# Patient Record
Sex: Male | Born: 1970 | Race: White | Hispanic: No | Marital: Single | Smoking: Current every day smoker
Health system: Southern US, Community
[De-identification: ages and names within clinical notes are randomized; demographics above are authoritative.]

## PROBLEM LIST (undated history)

## (undated) DIAGNOSIS — Z21 Asymptomatic human immunodeficiency virus [HIV] infection status: Secondary | ICD-10-CM

## (undated) DIAGNOSIS — E66811 Obesity, class 1: Secondary | ICD-10-CM

## (undated) DIAGNOSIS — N189 Chronic kidney disease, unspecified: Secondary | ICD-10-CM

## (undated) DIAGNOSIS — M109 Gout, unspecified: Secondary | ICD-10-CM

## (undated) DIAGNOSIS — G629 Polyneuropathy, unspecified: Secondary | ICD-10-CM

## (undated) DIAGNOSIS — E11621 Type 2 diabetes mellitus with foot ulcer: Secondary | ICD-10-CM

## (undated) DIAGNOSIS — L732 Hidradenitis suppurativa: Secondary | ICD-10-CM

## (undated) DIAGNOSIS — G4733 Obstructive sleep apnea (adult) (pediatric): Secondary | ICD-10-CM

## (undated) DIAGNOSIS — I5032 Chronic diastolic (congestive) heart failure: Secondary | ICD-10-CM

## (undated) DIAGNOSIS — I509 Heart failure, unspecified: Secondary | ICD-10-CM

## (undated) DIAGNOSIS — I2089 Other forms of angina pectoris: Secondary | ICD-10-CM

## (undated) DIAGNOSIS — J449 Chronic obstructive pulmonary disease, unspecified: Secondary | ICD-10-CM

## (undated) DIAGNOSIS — N184 Chronic kidney disease, stage 4 (severe): Secondary | ICD-10-CM

## (undated) DIAGNOSIS — B20 Human immunodeficiency virus [HIV] disease: Secondary | ICD-10-CM

## (undated) DIAGNOSIS — F17200 Nicotine dependence, unspecified, uncomplicated: Secondary | ICD-10-CM

## (undated) DIAGNOSIS — D649 Anemia, unspecified: Secondary | ICD-10-CM

## (undated) DIAGNOSIS — Z8619 Personal history of other infectious and parasitic diseases: Secondary | ICD-10-CM

## (undated) DIAGNOSIS — G4700 Insomnia, unspecified: Secondary | ICD-10-CM

## (undated) DIAGNOSIS — I10 Essential (primary) hypertension: Secondary | ICD-10-CM

## (undated) DIAGNOSIS — E113399 Type 2 diabetes mellitus with moderate nonproliferative diabetic retinopathy without macular edema, unspecified eye: Secondary | ICD-10-CM

## (undated) DIAGNOSIS — I25118 Atherosclerotic heart disease of native coronary artery with other forms of angina pectoris: Secondary | ICD-10-CM

## (undated) DIAGNOSIS — E119 Type 2 diabetes mellitus without complications: Secondary | ICD-10-CM

## (undated) DIAGNOSIS — E785 Hyperlipidemia, unspecified: Secondary | ICD-10-CM

## (undated) DIAGNOSIS — M1990 Unspecified osteoarthritis, unspecified site: Secondary | ICD-10-CM

## (undated) DIAGNOSIS — Z720 Tobacco use: Secondary | ICD-10-CM

## (undated) HISTORY — DX: Insomnia, unspecified: G47.00

## (undated) HISTORY — DX: Type 2 diabetes mellitus without complications: E11.9

## (undated) HISTORY — DX: Asymptomatic human immunodeficiency virus (hiv) infection status: Z21

## (undated) HISTORY — DX: Personal history of other infectious and parasitic diseases: Z86.19

## (undated) HISTORY — DX: Essential (primary) hypertension: I10

## (undated) HISTORY — DX: Human immunodeficiency virus (HIV) disease: B20

## (undated) HISTORY — DX: Hyperlipidemia, unspecified: E78.5

## (undated) HISTORY — PX: STENT PLACEMENT VASCULAR (ARMC HX): HXRAD1737

## (undated) HISTORY — DX: Tobacco use: Z72.0

---

## 2002-06-22 ENCOUNTER — Encounter: Payer: Self-pay | Admitting: Otolaryngology

## 2002-06-22 ENCOUNTER — Ambulatory Visit (HOSPITAL_COMMUNITY): Admission: RE | Admit: 2002-06-22 | Discharge: 2002-06-22 | Payer: Self-pay | Admitting: Otolaryngology

## 2007-04-26 ENCOUNTER — Ambulatory Visit: Payer: Self-pay | Admitting: Oncology

## 2007-04-27 ENCOUNTER — Ambulatory Visit: Payer: Self-pay | Admitting: Emergency Medicine

## 2007-04-29 ENCOUNTER — Ambulatory Visit: Payer: Self-pay | Admitting: Emergency Medicine

## 2007-05-01 ENCOUNTER — Encounter: Payer: Self-pay | Admitting: Infectious Diseases

## 2007-05-01 LAB — CBC WITH DIFFERENTIAL (CANCER CENTER ONLY)
BASO%: 0.2 % (ref 0.0–2.0)
EOS%: 1.8 % (ref 0.0–7.0)
HGB: 13.2 g/dL (ref 13.0–17.1)
LYMPH#: 0.1 10*3/uL — ABNORMAL LOW (ref 0.9–3.3)
MCHC: 34.5 g/dL (ref 32.0–35.9)
NEUT#: 1.3 10*3/uL — ABNORMAL LOW (ref 1.5–6.5)
RDW: 12.2 % (ref 10.5–14.6)
WBC: 1.6 10*3/uL — ABNORMAL LOW (ref 4.0–10.0)

## 2007-05-01 LAB — CONVERTED CEMR LAB
HCV Ab: NEGATIVE
Hep B S Ab: NEGATIVE
Hepatitis B Surface Ag: NEGATIVE

## 2007-05-01 LAB — MORPHOLOGY - CHCC SATELLITE: Platelet Morphology: NORMAL

## 2007-05-01 LAB — PROTIME-INR (CHCC SATELLITE)
INR: 1.1 — ABNORMAL LOW (ref 2.0–3.5)
Protime: 13.2 Seconds (ref 10.6–13.4)

## 2007-05-02 LAB — COMPREHENSIVE METABOLIC PANEL
AST: 32 U/L (ref 0–37)
Albumin: 3.5 g/dL (ref 3.5–5.2)
Alkaline Phosphatase: 97 U/L (ref 39–117)
BUN: 9 mg/dL (ref 6–23)
Potassium: 3.9 mEq/L (ref 3.5–5.3)

## 2007-05-02 LAB — IRON AND TIBC
%SAT: 55 % (ref 20–55)
TIBC: 308 ug/dL (ref 215–435)

## 2007-05-02 LAB — RETICULOCYTES (CHCC)
ABS Retic: 8.4 10*3/uL — ABNORMAL LOW (ref 19.0–186.0)
RBC.: 4.21 MIL/uL — ABNORMAL LOW (ref 4.22–5.81)

## 2007-05-02 LAB — FERRITIN: Ferritin: 1071 ng/mL — ABNORMAL HIGH (ref 22–322)

## 2007-05-03 ENCOUNTER — Encounter: Payer: Self-pay | Admitting: Oncology

## 2007-05-03 ENCOUNTER — Other Ambulatory Visit: Admission: RE | Admit: 2007-05-03 | Discharge: 2007-05-03 | Payer: Self-pay | Admitting: Oncology

## 2007-05-03 LAB — CBC WITH DIFFERENTIAL (CANCER CENTER ONLY)
BASO#: 0 10*3/uL (ref 0.0–0.2)
Eosinophils Absolute: 0 10*3/uL (ref 0.0–0.5)
HGB: 13.5 g/dL (ref 13.0–17.1)
LYMPH#: 0.1 10*3/uL — ABNORMAL LOW (ref 0.9–3.3)
MONO#: 0.3 10*3/uL (ref 0.1–0.9)
NEUT#: 1 10*3/uL — ABNORMAL LOW (ref 1.5–6.5)
RBC: 4.2 10*6/uL (ref 4.20–5.70)

## 2007-05-04 LAB — CYTOMEGALOVIRUS ANTIBODY, IGG: Cytomegalovirus Ab-IgG: <0.8 Index (ref <0.80)

## 2007-05-08 LAB — HEPATITIS A ANTIBODY, TOTAL: Hep A Total Ab: NEGATIVE

## 2007-05-08 LAB — HIV 1/2 CONFIRMATION: HIV-2 Ab: NEGATIVE

## 2007-05-08 LAB — HIV ANTIBODY (ROUTINE TESTING W REFLEX): HIV: REACTIVE

## 2007-05-08 LAB — HEPATITIS C ANTIBODY: HCV Ab: NEGATIVE

## 2007-05-13 ENCOUNTER — Ambulatory Visit: Payer: Self-pay | Admitting: Oncology

## 2007-05-15 LAB — BASIC METABOLIC PANEL - CANCER CENTER ONLY
CO2: 29 mEq/L (ref 18–33)
Calcium: 8.9 mg/dL (ref 8.0–10.3)
Creat: 1.1 mg/dl (ref 0.6–1.2)
Glucose, Bld: 144 mg/dL — ABNORMAL HIGH (ref 73–118)

## 2007-05-16 LAB — CBC WITH DIFFERENTIAL (CANCER CENTER ONLY)
BASO#: 0 10*3/uL (ref 0.0–0.2)
Eosinophils Absolute: 0 10*3/uL (ref 0.0–0.5)
HCT: 39.5 % (ref 38.7–49.9)
HGB: 13.7 g/dL (ref 13.0–17.1)
LYMPH%: 11.1 % — ABNORMAL LOW (ref 14.0–48.0)
MCH: 31.7 pg (ref 28.0–33.4)
MCV: 92 fL (ref 82–98)
MONO%: 14.7 % — ABNORMAL HIGH (ref 0.0–13.0)
NEUT#: 1.1 10*3/uL — ABNORMAL LOW (ref 1.5–6.5)
RBC: 4.32 10*6/uL (ref 4.20–5.70)

## 2007-05-17 ENCOUNTER — Ambulatory Visit: Payer: Self-pay | Admitting: Infectious Diseases

## 2007-05-17 ENCOUNTER — Encounter: Admission: RE | Admit: 2007-05-17 | Discharge: 2007-05-17 | Payer: Self-pay | Admitting: Infectious Diseases

## 2007-05-17 DIAGNOSIS — B370 Candidal stomatitis: Secondary | ICD-10-CM | POA: Insufficient documentation

## 2007-05-17 DIAGNOSIS — E119 Type 2 diabetes mellitus without complications: Secondary | ICD-10-CM | POA: Insufficient documentation

## 2007-05-17 DIAGNOSIS — E118 Type 2 diabetes mellitus with unspecified complications: Secondary | ICD-10-CM

## 2007-05-17 DIAGNOSIS — E1165 Type 2 diabetes mellitus with hyperglycemia: Secondary | ICD-10-CM

## 2007-05-31 ENCOUNTER — Ambulatory Visit: Payer: Self-pay | Admitting: Infectious Diseases

## 2007-05-31 DIAGNOSIS — B20 Human immunodeficiency virus [HIV] disease: Secondary | ICD-10-CM | POA: Insufficient documentation

## 2007-05-31 DIAGNOSIS — L0292 Furuncle, unspecified: Secondary | ICD-10-CM | POA: Insufficient documentation

## 2007-05-31 DIAGNOSIS — G4700 Insomnia, unspecified: Secondary | ICD-10-CM | POA: Insufficient documentation

## 2007-05-31 DIAGNOSIS — L0293 Carbuncle, unspecified: Secondary | ICD-10-CM

## 2007-06-03 ENCOUNTER — Encounter (INDEPENDENT_AMBULATORY_CARE_PROVIDER_SITE_OTHER): Payer: Self-pay | Admitting: *Deleted

## 2007-06-20 ENCOUNTER — Telehealth: Payer: Self-pay | Admitting: Infectious Diseases

## 2007-06-21 ENCOUNTER — Ambulatory Visit: Payer: Self-pay | Admitting: Internal Medicine

## 2007-06-21 LAB — CONVERTED CEMR LAB
BUN: 12 mg/dL (ref 6–23)
Bilirubin Urine: NEGATIVE
Blood in Urine, dipstick: NEGATIVE
CO2: 25 meq/L (ref 19–32)
Calcium: 9.7 mg/dL (ref 8.4–10.5)
Chloride: 101 meq/L (ref 96–112)
Creatinine, Ser: 0.86 mg/dL (ref 0.40–1.50)
Glucose, Bld: 172 mg/dL — ABNORMAL HIGH (ref 70–99)
Glucose, Urine, Semiquant: NEGATIVE
Ketones, urine, test strip: NEGATIVE
Nitrite: NEGATIVE
Potassium: 4.4 meq/L (ref 3.5–5.3)
Protein, U semiquant: NEGATIVE
Sodium: 139 meq/L (ref 135–145)
Specific Gravity, Urine: 1.015
Urobilinogen, UA: 0.2
WBC Urine, dipstick: NEGATIVE
pH: 6

## 2007-07-05 DIAGNOSIS — M549 Dorsalgia, unspecified: Secondary | ICD-10-CM | POA: Insufficient documentation

## 2007-07-08 ENCOUNTER — Ambulatory Visit: Payer: Self-pay | Admitting: Infectious Diseases

## 2007-07-08 ENCOUNTER — Encounter: Admission: RE | Admit: 2007-07-08 | Discharge: 2007-07-08 | Payer: Self-pay | Admitting: Infectious Diseases

## 2007-07-08 DIAGNOSIS — I10 Essential (primary) hypertension: Secondary | ICD-10-CM

## 2007-07-08 LAB — CONVERTED CEMR LAB
ALT: 41 units/L (ref 0–53)
AST: 28 units/L (ref 0–37)
Albumin: 4.2 g/dL (ref 3.5–5.2)
Alkaline Phosphatase: 124 units/L — ABNORMAL HIGH (ref 39–117)
BUN: 9 mg/dL (ref 6–23)
CO2: 21 meq/L (ref 19–32)
Calcium: 9.3 mg/dL (ref 8.4–10.5)
Chloride: 104 meq/L (ref 96–112)
Creatinine, Ser: 0.76 mg/dL (ref 0.40–1.50)
Glucose, Bld: 191 mg/dL — ABNORMAL HIGH (ref 70–99)
HCT: 41.5 % (ref 39.0–52.0)
HIV 1 RNA Quant: 14500 copies/mL — ABNORMAL HIGH (ref <50)
HIV-1 RNA Quant, Log: 4.16 — ABNORMAL HIGH (ref <1.70)
Hemoglobin: 14 g/dL (ref 13.0–17.0)
MCHC: 33.7 g/dL (ref 30.0–36.0)
MCV: 99.8 fL (ref 78.0–100.0)
Platelets: 229 10*3/uL (ref 150–400)
Potassium: 5 meq/L (ref 3.5–5.3)
RBC: 4.16 M/uL — ABNORMAL LOW (ref 4.22–5.81)
RDW: 15.6 % — ABNORMAL HIGH (ref 11.5–14.0)
Sodium: 138 meq/L (ref 135–145)
Total Bilirubin: 0.3 mg/dL (ref 0.3–1.2)
Total Protein: 8 g/dL (ref 6.0–8.3)
WBC: 3.3 10*3/uL — ABNORMAL LOW (ref 4.0–10.5)

## 2007-07-09 ENCOUNTER — Encounter: Payer: Self-pay | Admitting: Infectious Diseases

## 2007-07-09 LAB — CONVERTED CEMR LAB: CD4 Count: 60 microliters

## 2007-07-10 ENCOUNTER — Encounter (INDEPENDENT_AMBULATORY_CARE_PROVIDER_SITE_OTHER): Payer: Self-pay | Admitting: *Deleted

## 2007-07-10 ENCOUNTER — Ambulatory Visit: Payer: Self-pay | Admitting: Oncology

## 2007-07-19 ENCOUNTER — Telehealth: Payer: Self-pay | Admitting: Infectious Diseases

## 2007-07-23 ENCOUNTER — Ambulatory Visit: Payer: Self-pay | Admitting: Hospitalist

## 2007-07-23 LAB — CONVERTED CEMR LAB: Blood Glucose, Home Monitor: 1 mg/dL

## 2007-10-07 ENCOUNTER — Telehealth: Payer: Self-pay | Admitting: Infectious Diseases

## 2007-10-08 ENCOUNTER — Telehealth: Payer: Self-pay | Admitting: Infectious Diseases

## 2007-11-06 ENCOUNTER — Ambulatory Visit: Payer: Self-pay | Admitting: Infectious Diseases

## 2007-11-06 ENCOUNTER — Encounter: Admission: RE | Admit: 2007-11-06 | Discharge: 2007-11-06 | Payer: Self-pay | Admitting: Infectious Diseases

## 2007-11-06 LAB — CONVERTED CEMR LAB
Albumin: 4.5 g/dL (ref 3.5–5.2)
CO2: 23 meq/L (ref 19–32)
Glucose, Bld: 180 mg/dL — ABNORMAL HIGH (ref 70–99)
HIV 1 RNA Quant: <50 copies/mL (ref <50)
HIV-1 RNA Quant, Log: <1.7 (ref <1.70)
MCV: 104.3 fL — ABNORMAL HIGH (ref 78.0–100.0)
Potassium: 4.8 meq/L (ref 3.5–5.3)
RBC: 4.38 M/uL (ref 4.22–5.81)
Sodium: 138 meq/L (ref 135–145)
Total Protein: 7.8 g/dL (ref 6.0–8.3)
WBC: 6.3 10*3/uL (ref 4.0–10.5)

## 2007-12-23 ENCOUNTER — Encounter (INDEPENDENT_AMBULATORY_CARE_PROVIDER_SITE_OTHER): Payer: Self-pay | Admitting: *Deleted

## 2007-12-24 ENCOUNTER — Encounter (INDEPENDENT_AMBULATORY_CARE_PROVIDER_SITE_OTHER): Payer: Self-pay | Admitting: *Deleted

## 2008-01-31 ENCOUNTER — Telehealth (INDEPENDENT_AMBULATORY_CARE_PROVIDER_SITE_OTHER): Payer: Self-pay | Admitting: *Deleted

## 2008-03-03 ENCOUNTER — Telehealth: Payer: Self-pay | Admitting: Infectious Diseases

## 2008-03-13 ENCOUNTER — Telehealth: Payer: Self-pay | Admitting: Infectious Diseases

## 2008-04-01 ENCOUNTER — Encounter: Payer: Self-pay | Admitting: Licensed Clinical Social Worker

## 2008-04-28 ENCOUNTER — Ambulatory Visit: Payer: Self-pay | Admitting: Infectious Diseases

## 2008-04-28 ENCOUNTER — Encounter: Admission: RE | Admit: 2008-04-28 | Discharge: 2008-04-28 | Payer: Self-pay | Admitting: Infectious Diseases

## 2008-04-28 LAB — CONVERTED CEMR LAB
ALT: 27 units/L (ref 0–53)
Basophils Absolute: 0 10*3/uL (ref 0.0–0.1)
CO2: 22 meq/L (ref 19–32)
Chloride: 104 meq/L (ref 96–112)
Lymphocytes Relative: 13 % (ref 12–46)
Neutro Abs: 3.5 10*3/uL (ref 1.7–7.7)
Platelets: 204 10*3/uL (ref 150–400)
RDW: 12.4 % (ref 11.5–15.5)
Sodium: 141 meq/L (ref 135–145)
Total Bilirubin: 0.2 mg/dL — ABNORMAL LOW (ref 0.3–1.2)
Total Protein: 7.1 g/dL (ref 6.0–8.3)

## 2008-05-06 ENCOUNTER — Ambulatory Visit: Payer: Self-pay | Admitting: Infectious Diseases

## 2008-05-06 DIAGNOSIS — R51 Headache: Secondary | ICD-10-CM

## 2008-05-06 DIAGNOSIS — R519 Headache, unspecified: Secondary | ICD-10-CM | POA: Insufficient documentation

## 2008-05-13 ENCOUNTER — Telehealth: Payer: Self-pay | Admitting: Infectious Diseases

## 2008-05-15 ENCOUNTER — Telehealth (INDEPENDENT_AMBULATORY_CARE_PROVIDER_SITE_OTHER): Payer: Self-pay | Admitting: *Deleted

## 2008-06-08 ENCOUNTER — Telehealth: Payer: Self-pay | Admitting: Infectious Diseases

## 2008-06-11 ENCOUNTER — Telehealth: Payer: Self-pay | Admitting: Infectious Diseases

## 2008-06-15 ENCOUNTER — Encounter (INDEPENDENT_AMBULATORY_CARE_PROVIDER_SITE_OTHER): Payer: Self-pay | Admitting: *Deleted

## 2008-08-13 ENCOUNTER — Encounter: Payer: Self-pay | Admitting: Infectious Diseases

## 2008-08-20 ENCOUNTER — Ambulatory Visit: Payer: Self-pay | Admitting: Infectious Diseases

## 2008-08-21 LAB — CONVERTED CEMR LAB
AST: 15 units/L (ref 0–37)
Alkaline Phosphatase: 97 units/L (ref 39–117)
BUN: 15 mg/dL (ref 6–23)
Basophils Relative: 0 % (ref 0–1)
Creatinine, Ser: 0.95 mg/dL (ref 0.40–1.50)
Eosinophils Absolute: 0.1 10*3/uL (ref 0.0–0.7)
Eosinophils Relative: 1 % (ref 0–5)
MCHC: 34.2 g/dL (ref 30.0–36.0)
MCV: 100.9 fL — ABNORMAL HIGH (ref 78.0–100.0)
Monocytes Absolute: 0.8 10*3/uL (ref 0.1–1.0)
Monocytes Relative: 11 % (ref 3–12)
Neutrophils Relative %: 74 % (ref 43–77)
Potassium: 4.4 meq/L (ref 3.5–5.3)
RBC: 4.49 M/uL (ref 4.22–5.81)
Total Bilirubin: 0.2 mg/dL — ABNORMAL LOW (ref 0.3–1.2)

## 2008-08-26 ENCOUNTER — Telehealth (INDEPENDENT_AMBULATORY_CARE_PROVIDER_SITE_OTHER): Payer: Self-pay | Admitting: *Deleted

## 2008-09-02 ENCOUNTER — Ambulatory Visit: Payer: Self-pay | Admitting: Internal Medicine

## 2008-09-02 LAB — CONVERTED CEMR LAB: Blood Glucose, Fingerstick: 215

## 2008-09-21 ENCOUNTER — Encounter (INDEPENDENT_AMBULATORY_CARE_PROVIDER_SITE_OTHER): Payer: Self-pay | Admitting: *Deleted

## 2008-11-16 ENCOUNTER — Encounter: Payer: Self-pay | Admitting: Infectious Diseases

## 2008-11-16 ENCOUNTER — Telehealth (INDEPENDENT_AMBULATORY_CARE_PROVIDER_SITE_OTHER): Payer: Self-pay | Admitting: *Deleted

## 2008-12-02 ENCOUNTER — Ambulatory Visit: Payer: Self-pay | Admitting: Infectious Diseases

## 2008-12-28 ENCOUNTER — Ambulatory Visit: Payer: Self-pay | Admitting: Infectious Diseases

## 2008-12-28 LAB — CONVERTED CEMR LAB
CO2: 25 meq/L (ref 19–32)
Calcium: 9.7 mg/dL (ref 8.4–10.5)
Chloride: 101 meq/L (ref 96–112)
Cholesterol: 188 mg/dL (ref 0–200)
Creatinine, Ser: 0.94 mg/dL (ref 0.40–1.50)
Eosinophils Relative: 2 % (ref 0–5)
Glucose, Bld: 232 mg/dL — ABNORMAL HIGH (ref 70–99)
HCT: 42.4 % (ref 39.0–52.0)
HIV 1 RNA Quant: 107 copies/mL — ABNORMAL HIGH (ref <48)
HIV-1 RNA Quant, Log: 2.03 — ABNORMAL HIGH (ref <1.68)
Hemoglobin: 14.1 g/dL (ref 13.0–17.0)
Lymphocytes Relative: 15 % (ref 12–46)
Lymphs Abs: 0.9 10*3/uL (ref 0.7–4.0)
Monocytes Absolute: 0.6 10*3/uL (ref 0.1–1.0)
Neutro Abs: 4.4 10*3/uL (ref 1.7–7.7)
RBC: 4.17 M/uL — ABNORMAL LOW (ref 4.22–5.81)
Total Bilirubin: 0.2 mg/dL — ABNORMAL LOW (ref 0.3–1.2)
Total CHOL/HDL Ratio: 5.9
Total Protein: 7.5 g/dL (ref 6.0–8.3)
Triglycerides: 837 mg/dL — ABNORMAL HIGH (ref <150)
WBC: 6 10*3/uL (ref 4.0–10.5)

## 2008-12-29 ENCOUNTER — Encounter (INDEPENDENT_AMBULATORY_CARE_PROVIDER_SITE_OTHER): Payer: Self-pay | Admitting: *Deleted

## 2009-01-04 ENCOUNTER — Telehealth (INDEPENDENT_AMBULATORY_CARE_PROVIDER_SITE_OTHER): Payer: Self-pay | Admitting: *Deleted

## 2009-01-05 ENCOUNTER — Ambulatory Visit: Payer: Self-pay | Admitting: Internal Medicine

## 2009-01-05 DIAGNOSIS — E785 Hyperlipidemia, unspecified: Secondary | ICD-10-CM | POA: Insufficient documentation

## 2009-01-05 LAB — CONVERTED CEMR LAB: Blood Glucose, Fingerstick: 273

## 2009-02-01 ENCOUNTER — Telehealth: Payer: Self-pay | Admitting: Internal Medicine

## 2009-03-17 ENCOUNTER — Encounter (INDEPENDENT_AMBULATORY_CARE_PROVIDER_SITE_OTHER): Payer: Self-pay | Admitting: *Deleted

## 2009-04-02 ENCOUNTER — Telehealth: Payer: Self-pay | Admitting: *Deleted

## 2009-04-02 ENCOUNTER — Telehealth (INDEPENDENT_AMBULATORY_CARE_PROVIDER_SITE_OTHER): Payer: Self-pay | Admitting: *Deleted

## 2009-04-14 ENCOUNTER — Telehealth: Payer: Self-pay | Admitting: *Deleted

## 2009-04-21 ENCOUNTER — Encounter (INDEPENDENT_AMBULATORY_CARE_PROVIDER_SITE_OTHER): Payer: Self-pay | Admitting: *Deleted

## 2009-04-21 DIAGNOSIS — E113399 Type 2 diabetes mellitus with moderate nonproliferative diabetic retinopathy without macular edema, unspecified eye: Secondary | ICD-10-CM | POA: Insufficient documentation

## 2009-04-22 ENCOUNTER — Telehealth: Payer: Self-pay | Admitting: Infectious Diseases

## 2009-04-29 ENCOUNTER — Ambulatory Visit (HOSPITAL_COMMUNITY): Admission: RE | Admit: 2009-04-29 | Discharge: 2009-04-29 | Payer: Self-pay | Admitting: Internal Medicine

## 2009-04-29 ENCOUNTER — Encounter (INDEPENDENT_AMBULATORY_CARE_PROVIDER_SITE_OTHER): Payer: Self-pay | Admitting: *Deleted

## 2009-04-29 ENCOUNTER — Ambulatory Visit: Payer: Self-pay | Admitting: Internal Medicine

## 2009-04-29 DIAGNOSIS — R079 Chest pain, unspecified: Secondary | ICD-10-CM | POA: Insufficient documentation

## 2009-04-29 LAB — CONVERTED CEMR LAB: Hgb A1c MFr Bld: 8.1 %

## 2009-05-03 ENCOUNTER — Telehealth (INDEPENDENT_AMBULATORY_CARE_PROVIDER_SITE_OTHER): Payer: Self-pay | Admitting: *Deleted

## 2009-05-04 ENCOUNTER — Telehealth (INDEPENDENT_AMBULATORY_CARE_PROVIDER_SITE_OTHER): Payer: Self-pay | Admitting: *Deleted

## 2009-05-07 IMAGING — CT CT NECK-CHEST-ABD-PELV W/ CM
1 of 2 series · 9 of 14 positions shown, 11 images · non-contrast
Comparison: none

REASON FOR EXAM: thrombocytopenia
COMMENTS:

[Series 2: soft tissue · axial · 0.77mm/px · z∈[-846,-156]mm · 9 of 174 slices shown, 11 images]
[im 18/174  soft-tissue]
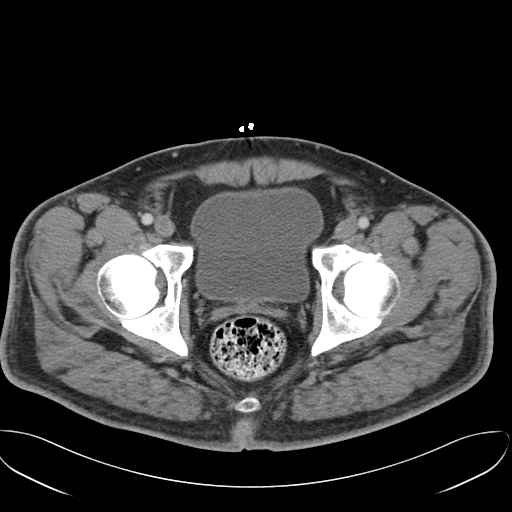
[im 18/174  bone]
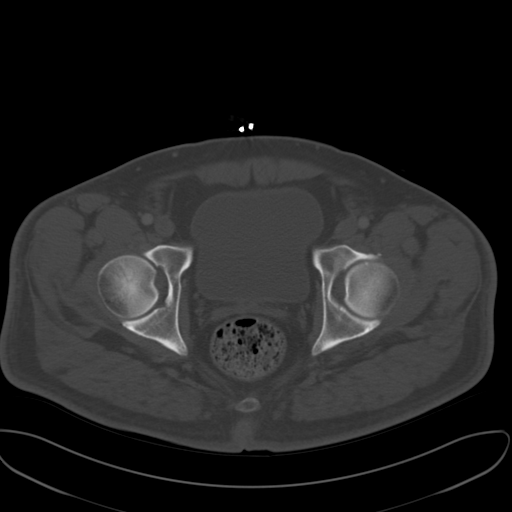
[im 35/174  soft-tissue]
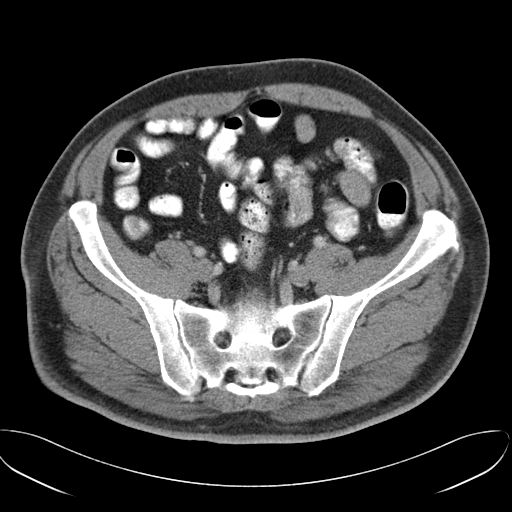
[im 52/174  soft-tissue]
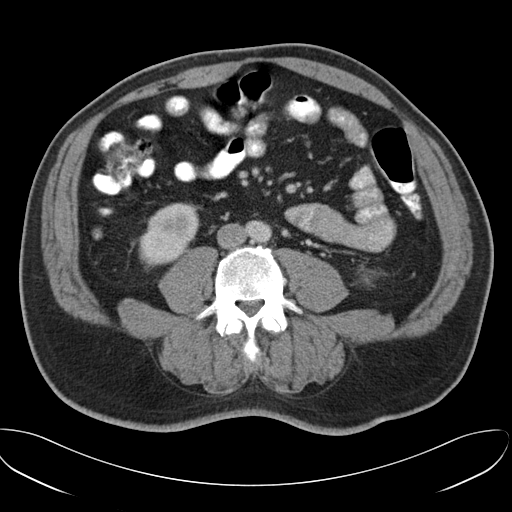
[im 70/174  soft-tissue]
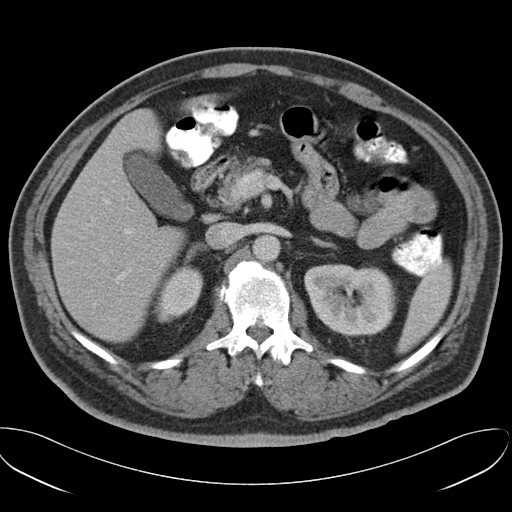
[im 87/174  soft-tissue]
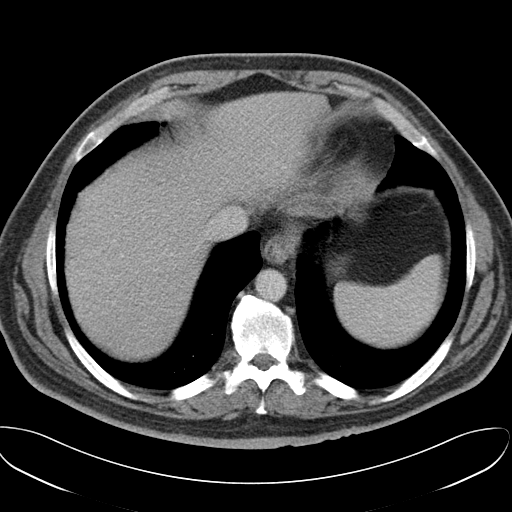
[im 104/174  soft-tissue]
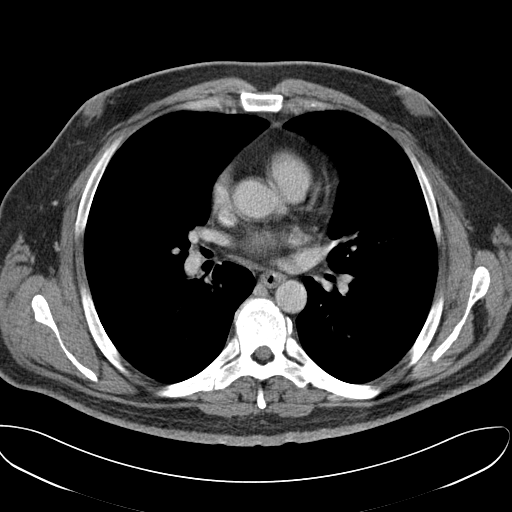
[im 122/174  soft-tissue]
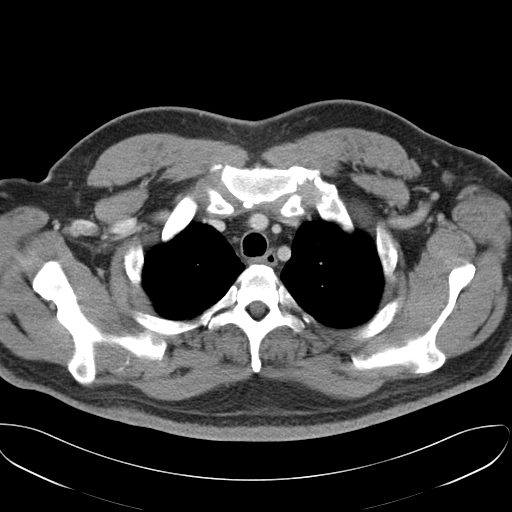
[im 139/174  soft-tissue]
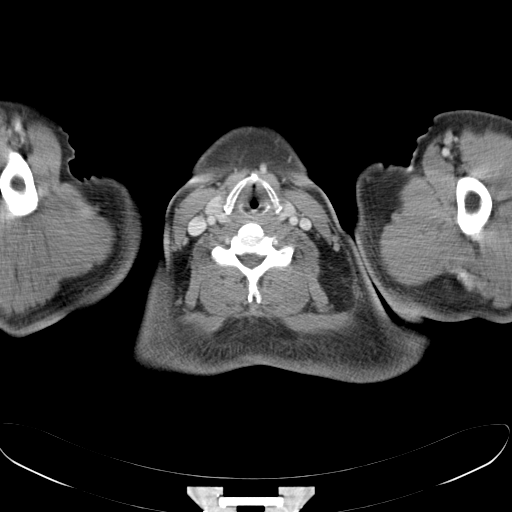
[im 156/174  soft-tissue]
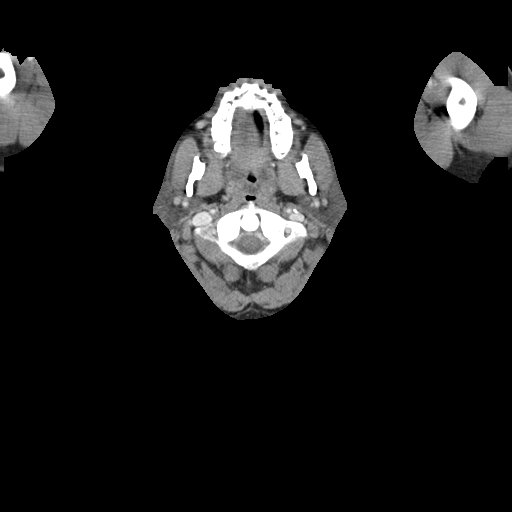
[im 156/174  bone]
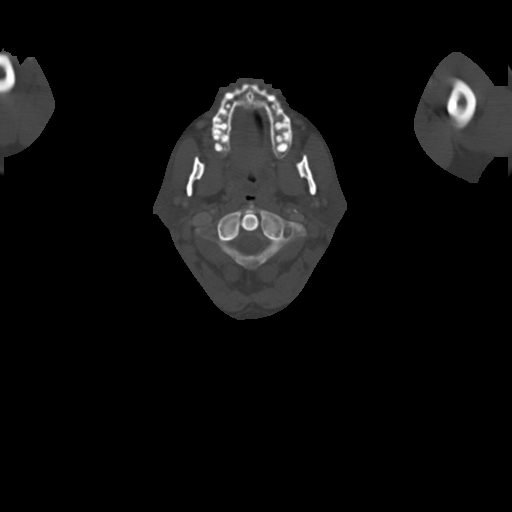

[9 of 14 positions shown; findings below may reference images not displayed]

PROCEDURE:     CT  - CT NECK CHEST ABD/PEL W  - May 13, 2007  [DATE]

RESULT:     The patient has unexplained thrombocytopenia.

CT SCAN OF THE NECK: The patient received 85 ml of Wsovue-HAS. The parotid
and submandibular glands are symmetric in density and size. The thyroid
lobes demonstrate some heterogeneous density especially on the RIGHT, which
may reflect a multinodular gland substance. The thyroid lobes are relatively
symmetric from RIGHT to LEFT. I see no pathologic sized cervical
lymphadenopathy. There is no evidence of a soft tissue abscess or soft
tissue mass within the structures of the neck. There is an air-fluid level
in the LEFT maxillary sinus with evidence of a solid-appearing soft tissue
mass-like structure that could reflect inspissated secretions or retention
cyst or polyp or conceivably neoplasm.
CONCLUSION: 1. I do not see evidence of cervical lymphadenopathy nor abscess.
2. There is soft tissue density as well as fluid in the LEFT maxillary
sinus. Correlation with the patient's clinical exam will be needed.
3.The RIGHT thyroid lobe is nodular in appearance but is symmetric in size
with the LEFT.

CT SCAN OF THE CHEST: The patient received 85 ml of Wsovue-HAS for this
study as well.

Within the mediastinum and hilar regions I see no pathologic size lymph
nodes. The cardiac chambers are normal in size. There is no pleural or
pericardial effusion. The caliber of the thoracic aorta is normal. I see no
axillary lymphadenopathy and the retrosternal soft tissues are normal in
appearance. At lung window settings I see no interstitial or alveolar
infiltrates nor abnormal nodules.
CONCLUSION: 1)I see no findings to suggest active lymphoma or other malignancy within
the thorax.

CT SCAN OF THE ABDOMEN AND PELVIS: The patient received the aforementioned
IV contrast as well as oral contrast.

The spleen is normal in size. There is likely an approximately 1.5 cm
diameter accessory spleen on image #94. The liver, gallbladder, pancreas,
stomach, adrenal glands and kidneys are normal in appearance. The partially
contrast filled loops of small and large bowel are normal in appearance.  A
moderate amount of stool and contrast is seen in the rectosigmoid. There is
no evidence of pelvic lymphadenopathy. The prostate gland and seminal
vesicles are normal in appearance. The periaortic and pericaval regions
exhibit no lymphadenopathy. There is no evidence of ascites.
IMPRESSION: 1. I do not see findings suspicious for active lymphoma within the neck or
chest or abdomen or pelvis.
2. There is evidence of probable inflammatory change with an air-fluid level
in the LEFT maxillary sinus.
3. There is nodularity in the RIGHT thyroid lobe.
4. I see no acute abnormality within the abdomen. There is a normal
appearing spleen. A tiny accessory spleen is present.
5. If a lymphoproliferative disorder is suspected, a PET CT study may be a
useful next step.

## 2009-05-10 ENCOUNTER — Telehealth (INDEPENDENT_AMBULATORY_CARE_PROVIDER_SITE_OTHER): Payer: Self-pay | Admitting: *Deleted

## 2009-05-14 ENCOUNTER — Telehealth (INDEPENDENT_AMBULATORY_CARE_PROVIDER_SITE_OTHER): Payer: Self-pay | Admitting: *Deleted

## 2009-05-27 ENCOUNTER — Telehealth (INDEPENDENT_AMBULATORY_CARE_PROVIDER_SITE_OTHER): Payer: Self-pay | Admitting: *Deleted

## 2009-06-02 ENCOUNTER — Ambulatory Visit: Payer: Self-pay | Admitting: Infectious Diseases

## 2009-06-02 DIAGNOSIS — F17200 Nicotine dependence, unspecified, uncomplicated: Secondary | ICD-10-CM | POA: Insufficient documentation

## 2009-06-02 LAB — CONVERTED CEMR LAB
ALT: 39 units/L (ref 0–53)
Albumin: 4.3 g/dL (ref 3.5–5.2)
Basophils Absolute: 0 10*3/uL (ref 0.0–0.1)
Basophils Relative: 0 % (ref 0–1)
CO2: 19 meq/L (ref 19–32)
Eosinophils Relative: 1 % (ref 0–5)
GFR calc Af Amer: >60 mL/min (ref >60)
Glucose, Bld: 188 mg/dL — ABNORMAL HIGH (ref 70–99)
HCT: 41.9 % (ref 39.0–52.0)
Hemoglobin: 13.8 g/dL (ref 13.0–17.0)
MCHC: 32.9 g/dL (ref 30.0–36.0)
Monocytes Absolute: 0.5 10*3/uL (ref 0.1–1.0)
Neutro Abs: 3.5 10*3/uL (ref 1.7–7.7)
Platelets: 184 10*3/uL (ref 150–400)
Potassium: 4.7 meq/L (ref 3.5–5.3)
RDW: 12.9 % (ref 11.5–15.5)
Sodium: 138 meq/L (ref 135–145)
Total Bilirubin: 0.2 mg/dL — ABNORMAL LOW (ref 0.3–1.2)
Total Protein: 7.4 g/dL (ref 6.0–8.3)

## 2009-07-21 ENCOUNTER — Telehealth (INDEPENDENT_AMBULATORY_CARE_PROVIDER_SITE_OTHER): Payer: Self-pay | Admitting: *Deleted

## 2009-07-29 ENCOUNTER — Telehealth (INDEPENDENT_AMBULATORY_CARE_PROVIDER_SITE_OTHER): Payer: Self-pay | Admitting: *Deleted

## 2009-08-04 ENCOUNTER — Telehealth (INDEPENDENT_AMBULATORY_CARE_PROVIDER_SITE_OTHER): Payer: Self-pay | Admitting: *Deleted

## 2009-08-10 ENCOUNTER — Ambulatory Visit: Payer: Self-pay | Admitting: Internal Medicine

## 2009-08-16 ENCOUNTER — Telehealth (INDEPENDENT_AMBULATORY_CARE_PROVIDER_SITE_OTHER): Payer: Self-pay | Admitting: Internal Medicine

## 2009-09-16 ENCOUNTER — Telehealth: Payer: Self-pay | Admitting: Internal Medicine

## 2009-09-21 ENCOUNTER — Ambulatory Visit: Payer: Self-pay | Admitting: Internal Medicine

## 2009-09-21 DIAGNOSIS — R000 Tachycardia, unspecified: Secondary | ICD-10-CM

## 2009-09-21 LAB — CONVERTED CEMR LAB: Blood Glucose, Fingerstick: 290

## 2009-09-27 ENCOUNTER — Encounter (INDEPENDENT_AMBULATORY_CARE_PROVIDER_SITE_OTHER): Payer: Self-pay | Admitting: *Deleted

## 2009-09-27 ENCOUNTER — Ambulatory Visit: Payer: Self-pay | Admitting: Infectious Diseases

## 2009-09-27 LAB — CONVERTED CEMR LAB
AST: 23 units/L (ref 0–37)
Albumin: 4.3 g/dL (ref 3.5–5.2)
BUN: 15 mg/dL (ref 6–23)
CO2: 20 meq/L (ref 19–32)
Calcium: 9.6 mg/dL (ref 8.4–10.5)
Chloride: 104 meq/L (ref 96–112)
Cholesterol: 167 mg/dL (ref 0–200)
Eosinophils Relative: 1 % (ref 0–5)
Glucose, Bld: 189 mg/dL — ABNORMAL HIGH (ref 70–99)
HCT: 39.8 % (ref 39.0–52.0)
HDL: 34 mg/dL — ABNORMAL LOW (ref >39)
HIV 1 RNA Quant: <48 copies/mL (ref <48)
Hemoglobin: 13.3 g/dL (ref 13.0–17.0)
Lymphocytes Relative: 13 % (ref 12–46)
Lymphs Abs: 0.7 10*3/uL (ref 0.7–4.0)
Monocytes Absolute: 0.6 10*3/uL (ref 0.1–1.0)
Monocytes Relative: 11 % (ref 3–12)
Potassium: 4.3 meq/L (ref 3.5–5.3)
RBC: 3.92 M/uL — ABNORMAL LOW (ref 4.22–5.81)
WBC: 5.3 10*3/uL (ref 4.0–10.5)

## 2009-10-01 ENCOUNTER — Telehealth: Payer: Self-pay | Admitting: Internal Medicine

## 2009-10-12 ENCOUNTER — Encounter (INDEPENDENT_AMBULATORY_CARE_PROVIDER_SITE_OTHER): Payer: Self-pay | Admitting: Internal Medicine

## 2009-10-12 ENCOUNTER — Ambulatory Visit (HOSPITAL_COMMUNITY): Admission: RE | Admit: 2009-10-12 | Discharge: 2009-10-12 | Payer: Self-pay | Admitting: Internal Medicine

## 2009-10-15 ENCOUNTER — Telehealth: Payer: Self-pay | Admitting: *Deleted

## 2009-10-15 ENCOUNTER — Telehealth (INDEPENDENT_AMBULATORY_CARE_PROVIDER_SITE_OTHER): Payer: Self-pay | Admitting: *Deleted

## 2009-12-01 ENCOUNTER — Telehealth (INDEPENDENT_AMBULATORY_CARE_PROVIDER_SITE_OTHER): Payer: Self-pay | Admitting: *Deleted

## 2009-12-06 ENCOUNTER — Telehealth (INDEPENDENT_AMBULATORY_CARE_PROVIDER_SITE_OTHER): Payer: Self-pay | Admitting: *Deleted

## 2010-05-06 ENCOUNTER — Telehealth: Payer: Self-pay | Admitting: Internal Medicine

## 2010-05-24 ENCOUNTER — Telehealth: Payer: Self-pay | Admitting: Infectious Diseases

## 2010-06-09 ENCOUNTER — Telehealth: Payer: Self-pay | Admitting: Internal Medicine

## 2010-06-20 ENCOUNTER — Encounter (INDEPENDENT_AMBULATORY_CARE_PROVIDER_SITE_OTHER): Payer: Self-pay | Admitting: *Deleted

## 2010-06-20 ENCOUNTER — Ambulatory Visit: Payer: Self-pay | Admitting: Internal Medicine

## 2010-06-20 LAB — CONVERTED CEMR LAB
Blood Glucose, Fingerstick: 332
Microalb, Ur: 1.66 mg/dL (ref 0.00–1.89)

## 2010-09-06 ENCOUNTER — Telehealth (INDEPENDENT_AMBULATORY_CARE_PROVIDER_SITE_OTHER): Payer: Self-pay | Admitting: *Deleted

## 2010-09-09 ENCOUNTER — Ambulatory Visit: Payer: Self-pay | Admitting: Infectious Diseases

## 2010-09-09 ENCOUNTER — Encounter: Payer: Self-pay | Admitting: Internal Medicine

## 2010-09-09 LAB — CONVERTED CEMR LAB
ALT: 34 units/L (ref 0–53)
AST: 20 units/L (ref 0–37)
Albumin: 4.3 g/dL (ref 3.5–5.2)
Alkaline Phosphatase: 80 units/L (ref 39–117)
Basophils Relative: 0 % (ref 0–1)
Eosinophils Absolute: 0.1 10*3/uL (ref 0.0–0.7)
Lymphs Abs: 0.9 10*3/uL (ref 0.7–4.0)
MCHC: 34.8 g/dL (ref 30.0–36.0)
MCV: 99.5 fL (ref 78.0–100.0)
Neutrophils Relative %: 73 % (ref 43–77)
Platelets: 189 10*3/uL (ref 150–400)
Potassium: 4.5 meq/L (ref 3.5–5.3)
Sodium: 138 meq/L (ref 135–145)
Total Protein: 6.9 g/dL (ref 6.0–8.3)
WBC: 5.4 10*3/uL (ref 4.0–10.5)

## 2010-09-26 ENCOUNTER — Telehealth: Payer: Self-pay | Admitting: Internal Medicine

## 2010-10-17 ENCOUNTER — Telehealth (INDEPENDENT_AMBULATORY_CARE_PROVIDER_SITE_OTHER): Payer: Self-pay | Admitting: *Deleted

## 2010-10-19 ENCOUNTER — Ambulatory Visit: Payer: Self-pay | Admitting: Infectious Diseases

## 2010-10-28 ENCOUNTER — Telehealth: Payer: Self-pay | Admitting: Internal Medicine

## 2010-11-01 ENCOUNTER — Telehealth (INDEPENDENT_AMBULATORY_CARE_PROVIDER_SITE_OTHER): Payer: Self-pay | Admitting: *Deleted

## 2010-11-03 ENCOUNTER — Telehealth (INDEPENDENT_AMBULATORY_CARE_PROVIDER_SITE_OTHER): Payer: Self-pay | Admitting: *Deleted

## 2010-11-11 ENCOUNTER — Telehealth: Payer: Self-pay | Admitting: Internal Medicine

## 2010-11-11 ENCOUNTER — Telehealth: Payer: Self-pay | Admitting: *Deleted

## 2010-11-16 ENCOUNTER — Telehealth: Payer: Self-pay | Admitting: *Deleted

## 2010-11-17 ENCOUNTER — Encounter: Payer: Self-pay | Admitting: Internal Medicine

## 2010-11-21 ENCOUNTER — Encounter (INDEPENDENT_AMBULATORY_CARE_PROVIDER_SITE_OTHER): Payer: Self-pay | Admitting: *Deleted

## 2010-11-22 ENCOUNTER — Ambulatory Visit: Payer: Self-pay | Admitting: Internal Medicine

## 2010-11-22 LAB — CONVERTED CEMR LAB: Hgb A1c MFr Bld: 8.4 %

## 2010-12-05 ENCOUNTER — Ambulatory Visit: Payer: Self-pay | Admitting: Internal Medicine

## 2010-12-07 ENCOUNTER — Encounter: Payer: Self-pay | Admitting: Internal Medicine

## 2011-01-02 ENCOUNTER — Ambulatory Visit: Admit: 2011-01-02 | Payer: Self-pay

## 2011-01-22 LAB — CONVERTED CEMR LAB
ALT: 49 units/L (ref 0–53)
AST: 32 units/L (ref 0–37)
Albumin: 4.4 g/dL (ref 3.5–5.2)
Alkaline Phosphatase: 125 units/L — ABNORMAL HIGH (ref 39–117)
BUN: 9 mg/dL (ref 6–23)
CO2: 24 meq/L (ref 19–32)
Calcium: 9.8 mg/dL (ref 8.4–10.5)
Chlamydia, Swab/Urine, PCR: NEGATIVE
Chloride: 105 meq/L (ref 96–112)
Cholesterol: 164 mg/dL (ref 0–200)
Creatinine, Ser: 0.88 mg/dL (ref 0.40–1.50)
GC Probe Amp, Urine: NEGATIVE
Glucose, Bld: 216 mg/dL — ABNORMAL HIGH (ref 70–99)
HCT: 42.8 % (ref 39.0–52.0)
HDL: 47 mg/dL (ref >39)
HIV 1 RNA Quant: 92600 copies/mL — ABNORMAL HIGH (ref <50)
HIV-1 RNA Quant, Log: 4.97 — ABNORMAL HIGH (ref <1.70)
Hemoglobin: 14.7 g/dL (ref 13.0–17.0)
LDL Cholesterol: 90 mg/dL (ref 0–99)
MCHC: 34.3 g/dL (ref 30.0–36.0)
MCV: 92 fL (ref 78.0–100.0)
Platelets: 91 10*3/uL — ABNORMAL LOW (ref 150–400)
Potassium: 4.8 meq/L (ref 3.5–5.3)
RBC: 4.65 M/uL (ref 4.22–5.81)
RDW: 13.9 % (ref 11.5–14.0)
Sodium: 142 meq/L (ref 135–145)
Total Bilirubin: 0.4 mg/dL (ref 0.3–1.2)
Total CHOL/HDL Ratio: 3.5
Total Protein: 8.3 g/dL (ref 6.0–8.3)
Triglycerides: 137 mg/dL (ref <150)
VLDL: 27 mg/dL (ref 0–40)
WBC: 2.4 10*3/uL — ABNORMAL LOW (ref 4.0–10.5)

## 2011-01-26 NOTE — Progress Notes (Signed)
Summary: Refill/gh  Phone Note Refill Request Message from:  Patient on September 26, 2010 4:18 PM  Refills Requested: Medication #1:  METFORMIN HCL 1000 MG TABS Take one tablet by mouth twice daily  Method Requested: Electronic Initial call taken by: Angelina Ok RN,  September 26, 2010 4:18 PM  Follow-up for Phone Call        Rx denied because was refilled in June with 11 refills. Follow-up by: Deatra Robinson MD,  September 29, 2010 10:11 PM

## 2011-01-26 NOTE — Miscellaneous (Signed)
Summary: new script called into [facility]  Clinical Lists Changes  Medications: Rx of ATRIPLA 600-200-300 MG  TABS (EFAVIRENZ-EMTRICITAB-TENOFOVIR) once daily;  #30 x 11;  Signed;  Entered by: Paulo Fruit  BS,CPht II,MPH;  Authorized by: Johny Sax MD;  Method used: Electronically to [facility] [phone]*, [address] [place], [place]  [address], Ph: [phone], Fax: Observations: Added new observation of AIDSDAP: Yes 2011 (06/20/2010 9:28)    Prescriptions: ATRIPLA 600-200-300 MG  TABS (EFAVIRENZ-EMTRICITAB-TENOFOVIR) once daily  #30 x 11   Entered by:   Paulo Fruit  BS,CPht II,MPH   Authorized by:   Johny Sax MD   Signed by:   Paulo Fruit  BS,CPht II,MPH on 06/20/2010   Method used:   Electronically to        [facility] [phone]* (retail)       [address]       [place], [place]  [address]       Ph: [phone]       Fax:    RxID:   [id number]  Paulo Fruit  BS,CPht II,MPH  June 20, 2010 9:29 AM

## 2011-01-26 NOTE — Progress Notes (Signed)
Summary: refill/ hla  Phone Note Refill Request Message from:  Patient on November 11, 2010 12:05 PM  Refills Requested: Medication #1:  METFORMIN HCL 500 MG TABS Take one tablet by mouth daily in the morning   Dosage confirmed as above?Dosage Confirmed Initial call taken by: Marin Roberts RN,  November 11, 2010 12:06 PM  Follow-up for Phone Call        Rx denied because needs OV Follow-up by: Deatra Robinson MD,  November 11, 2010 3:08 PM

## 2011-01-26 NOTE — Miscellaneous (Signed)
Summary: med refill request denied  Denied med refill request. Needs an OV follow up. thank you.

## 2011-01-26 NOTE — Assessment & Plan Note (Signed)
Summary: ACUTE-EST/CK/MEDS/CFB   Vital Signs:  Patient profile:   40 year old male Height:      73 inches Weight:      244.6 pounds BMI:     32.39 Temp:     97.4 degrees F oral Pulse rate:   90 / minute BP sitting:   133 / 84  (right arm)  Vitals Entered By: Filomena Jungling NT II (December 05, 2010 8:21 AM) CC: F/U visit for diabetes. Is Patient Diabetic? Yes Did you bring your meter with you today? No Pain Assessment Patient in pain? yes     Location: right hip Intensity: 2 Type: aching Onset of pain  Intermittent Nutritional Status BMI of > 30 = obese CBG Result 217  Have you ever been in a relationship where you felt threatened, hurt or afraid?No   Does patient need assistance? Functional Status Self care Ambulation Normal   Primary Care Provider:  Deatra Robinson MD  CC:  F/U visit for diabetes..  History of Present Illness: A f/u  OV. Patient denies any concerns. Takes all his medications as prescribed. Denies any Sx.  Depression History:      The patient denies a depressed mood most of the day and a diminished interest in his usual daily activities.         Preventive Screening-Counseling & Management  Alcohol-Tobacco     Alcohol drinks/day: very little     Alcohol type: occasionally     Smoking Status: current     Smoking Cessation Counseling: yes     Packs/Day: 1.0     Year Started: about 20 years  Caffeine-Diet-Exercise     Caffeine use/day: tea occassionally     Does Patient Exercise: no     Type of exercise: active at work  Comments: counselled on smoking cessation  Current Problems (verified): 1)  Tachycardia  (ICD-785.0) 2)  Tobacco User  (ICD-305.1) 3)  Chest Pain  (ICD-786.50) 4)  Diabetes-type 2  (ICD-250.00) 5)  Hyperlipidemia  (ICD-272.4) 6)  Headache  (ICD-784.0) 7)  Hypertension  (ICD-401.9) 8)  Back Pain  (ICD-724.5) 9)  Furuncle  (ICD-680.9) 10)  Insomnia  (ICD-780.52) 11)  Aids  (ICD-042) 12)  Candidiasis, Mouth (THRUSH)   (ICD-112.0) 13)  Family History Diabetes 1st Degree Relative  (ICD-V18.0) 14)  Family History of Cad Male 1st Degree Relative <50  (ICD-V17.3) 15)  Family History of Cad Male 1st Degree Relative <60  (ICD-V16.49) 16)  Aodm  (ICD-250.00)  Medications Prior to Update: 1)  Metformin Hcl 1000 Mg Tabs (Metformin Hcl) .... Take One Tablet By Mouth Twice Daily 2)  Glipizide 10 Mg Tabs (Glipizide) .... Take 1 Tablet By Mouth Twice A Day 3)  Atripla 600-200-300 Mg  Tabs (Efavirenz-Emtricitab-Tenofovir) .... Once Daily 4)  Lisinopril-Hydrochlorothiazide 20-25 Mg Tabs (Lisinopril-Hydrochlorothiazide) .... Take One Tablet By Mouth Once Daily 5)  Pravastatin Sodium 40 Mg Tabs (Pravastatin Sodium) .... Take One Tablet By Mouth Once Daily 6)  Lantus 100 Unit/ml Soln (Insulin Glargine) .... Inject 38 Units Subcutaneously At Night 7)  Aspirin Adult Low Strength 81 Mg Tbec (Aspirin) .... Take One Tab By Mouth Once Daily 8)  Nitrostat 0.4 Mg Subl (Nitroglycerin) .... Place One Tab Under Tongue For Relief of Chest Pain Every 5 Minutes, Max 3 Doses in 15 Minutes 9)  Insulin Syringe 29g X 1/2" 0.5 Ml Misc (Insulin Syringe-Needle U-100) .... Use It To Draw Insulin As Prescribed.  Current Medications (verified): 1)  Metformin Hcl 1000 Mg Tabs (Metformin Hcl) .Marland KitchenMarland KitchenMarland Kitchen  Take One Tablet By Mouth Twice Daily 2)  Glipizide 10 Mg Tabs (Glipizide) .... Take 1 Tablet By Mouth Twice A Day 3)  Atripla 600-200-300 Mg  Tabs (Efavirenz-Emtricitab-Tenofovir) .... Once Daily 4)  Lisinopril-Hydrochlorothiazide 20-25 Mg Tabs (Lisinopril-Hydrochlorothiazide) .... Take One Tablet By Mouth Once Daily 5)  Pravastatin Sodium 40 Mg Tabs (Pravastatin Sodium) .... Take One Tablet By Mouth Once Daily 6)  Lantus 100 Unit/ml Soln (Insulin Glargine) .... Inject 38 Units Subcutaneously At Night 7)  Aspirin Adult Low Strength 81 Mg Tbec (Aspirin) .... Take One Tab By Mouth Once Daily 8)  Nitrostat 0.4 Mg Subl (Nitroglycerin) .... Place One Tab  Under Tongue For Relief of Chest Pain Every 5 Minutes, Max 3 Doses in 15 Minutes 9)  Insulin Syringe 29g X 1/2" 0.5 Ml Misc (Insulin Syringe-Needle U-100) .... Use It To Draw Insulin As Prescribed.  Allergies (verified): 1)  ! Sulfa  Past History:  Past Medical History: Last updated: 09/27/2009 HIV+ DM2 Hyperlipidemia Smoker  Family History: Last updated: 05/17/2007 Family History of CAD Male 1st degree relative <60 Family History of CAD Male 1st degree relative <50 Family History Diabetes 1st degree relative  Social History: Last updated: 05/17/2007 Current Smoker- 2ppd. nerves shot, couldn't quit now.  Alcohol use-yes. never to point of passing out.   same girlfriend x 7 yrs. no prev txf, no prev male male intercourse. unclear how many heterosexual partners he has had (>10, <100).   Risk Factors: Alcohol Use: very little (12/05/2010) Caffeine Use: tea occassionally (12/05/2010) Exercise: no (12/05/2010)  Risk Factors: Smoking Status: current (12/05/2010) Packs/Day: 1.0 (12/05/2010)  Physical Exam  General:  alert and well-developed.   Head:  normocephalic.   Eyes:  vision grossly intact, pupils equal, pupils round, and pupils reactive to light.   Mouth:  pharynx pink and moist.   Neck:  supple.   Lungs:  normal respiratory effort, no accessory muscle use, no crackles, and no wheezes.   Heart:  normal rate, regular rhythm, no murmur, no gallop, and no rub.   Abdomen:  soft, non-tender, normal bowel sounds, no distention, and no masses.   Msk:  normal ROM, no joint tenderness, no joint swelling, and no joint warmth.   Pulses:  2+ dp/pt pulses bilaterally.  Extremities:  No edema.  Neurologic:  alert & oriented X3, cranial nerves II-XII intact, and strength normal in all extremities.   Psych:  Oriented X3 and normally interactive.    Diabetes Management Exam:    Foot Exam (with socks and/or shoes not present):       Sensory-Pinprick/Light touch:          Left  medial foot (L-4): diminished          Left dorsal foot (L-5): diminished          Left lateral foot (S-1): diminished          Right medial foot (L-4): diminished          Right dorsal foot (L-5): diminished          Right lateral foot (S-1): diminished       Sensory-Monofilament:          Left foot: diminished          Right foot: diminished       Inspection:          Left foot: normal          Right foot: normal       Nails:  Left foot: thickened          Right foot: thickened    Foot Exam by Podiatrist:       Date: 12/05/2010       Results: early diabetic findings       Done by: Denton Meek   Impression & Recommendations:  Problem # 1:  DIABETES-TYPE 2 (ICD-250.00) Assessment Unchanged Will increase lantus to 40 Units Subcutaneously at bedtime. D/C glipizide. Diet, exercise, foot care and weight managment discussed with the patient. re-referred for a fundoscopic exam->will need to see Ms. Chauncey Reading for [facility] assistance program application. It is too soon to recheck HgbA1C. Consider to check fructominase instead ->will discuss withMs. Rhiley. Otherwise, will follow up after the [facility] season in February. The following medications were removed from the medication list:    Glipizide 10 Mg Tabs (Glipizide) .Marland Kitchen... Take 1 tablet by mouth twice a day His updated medication list for this problem includes:    Metformin Hcl 1000 Mg Tabs (Metformin hcl) .Marland Kitchen... Take one tablet by mouth twice daily    Lisinopril-hydrochlorothiazide 20-25 Mg Tabs (Lisinopril-hydrochlorothiazide) .Marland Kitchen... Take one tablet by mouth once daily    Lantus 100 Unit/ml Soln (Insulin glargine) ..... Inject 40 units subcutaneously at night    Aspirin Adult Low Strength 81 Mg Tbec (Aspirin) .Marland Kitchen... Take one tab by mouth once daily  Labs Reviewed: Creat: 1.05 (09/09/2010)     Last Eye Exam: Moderate non-proliferative diabetic retinopathy.  OU HIV retinopathy OU Visual acuity OD: 20/30      Visual acuity OS  :20/25        (04/21/2009) Reviewed HgBA1c results: 8.4 (11/22/2010)  8.2 (06/20/2010)  His updated medication list for this problem includes:    Metformin Hcl 1000 Mg Tabs (Metformin hcl) .Marland Kitchen... Take one tablet by mouth twice daily    Glipizide 10 Mg Tabs (Glipizide) .Marland Kitchen... Take 1 tablet by mouth twice a day    Lisinopril-hydrochlorothiazide 20-25 Mg Tabs (Lisinopril-hydrochlorothiazide) .Marland Kitchen... Take one tablet by mouth once daily    Lantus 100 Unit/ml Soln (Insulin glargine) ..... Inject 38 units subcutaneously at night    Aspirin Adult Low Strength 81 Mg Tbec (Aspirin) .Marland Kitchen... Take one tab by mouth once daily  Problem # 2:  HYPERLIPIDEMIA (ICD-272.4) Assessment: Unchanged Low cholesterol and high fiber diet discussed with the patient. His updated medication list for this problem includes:    Pravastatin Sodium 40 Mg Tabs (Pravastatin sodium) .Marland Kitchen... Take one tablet by mouth once daily  Orders: T-Lipid Profile [phone])  Labs Reviewed: SGOT: 20 (09/09/2010)   SGPT: 34 (09/09/2010)  Prior 10 Yr Risk Heart Disease: Not enough information (04/29/2009)   HDL:34 (09/27/2009), 32 (12/28/2008)  LDL:* mg/dL (51/76/1607), See Comment mg/dL (37/09/6268)  SWNI:627 (09/27/2009), 188 (12/28/2008)  Trig:418 (09/27/2009), 837 (12/28/2008)  His updated medication list for this problem includes:    Pravastatin Sodium 40 Mg Tabs (Pravastatin sodium) .Marland Kitchen... Take one tablet by mouth once daily  Problem # 3:  HYPERTENSION (ICD-401.9) Assessment: Unchanged No change in regimen. Low salt diet; weight management addressed. His updated medication list for this problem includes:    Lisinopril-hydrochlorothiazide 20-25 Mg Tabs (Lisinopril-hydrochlorothiazide) .Marland Kitchen... Take one tablet by mouth once daily  BP today: 133/84 Prior BP: 126/79 (11/22/2010)  Prior 10 Yr Risk Heart Disease: Not enough information (04/29/2009)  Labs Reviewed: K+: 4.5 (09/09/2010) Creat: : 1.05 (09/09/2010)   Chol: 167 (09/27/2009)    HDL: 34 (09/27/2009)   LDL: * mg/dL (03/50/0938)   TG: 182 (09/27/2009)  Problem # 4:  HIV INFECTION (ICD-042) Assessment: Unchanged patient is being followed by Dr. [name] in ID clinic. Continue with Atripla as instructed.  Complete Medication List: 1)  Metformin Hcl 1000 Mg Tabs (Metformin hcl) .... Take one tablet by mouth twice daily 2)  Atripla 600-200-300 Mg Tabs (Efavirenz-emtricitab-tenofovir) .... Once daily 3)  Lisinopril-hydrochlorothiazide 20-25 Mg Tabs (Lisinopril-hydrochlorothiazide) .... Take one tablet by mouth once daily 4)  Pravastatin Sodium 40 Mg Tabs (Pravastatin sodium) .... Take one tablet by mouth once daily 5)  Lantus 100 Unit/ml Soln (Insulin glargine) .... Inject 40 units subcutaneously at night 6)  Aspirin Adult Low Strength 81 Mg Tbec (Aspirin) .... Take one tab by mouth once daily 7)  Nitrostat 0.4 Mg Subl (Nitroglycerin) .... Place one tab under tongue for relief of chest pain every 5 minutes, max 3 doses in 15 minutes 8)  Insulin Syringe 29g X 1/2" 0.5 Ml Misc (Insulin syringe-needle u-100) .... Use it to draw insulin as prescribed.  Other Orders: Capillary Blood Glucose/CBG (14782) Tdap => 65yrs IM (95621) Pneumococcal Vaccine (30865) Admin 1st Vaccine (78469) Admin of Any Addtl Vaccine (62952)  Patient Instructions: 1)  Please, increase Insulin dose to 40 Units before bedtime. 2)  Please, check your blood sugar once a day before breakfast. 3)  Please, return in February (on an empty stomach)->for an office visit and a blood work. 4)  Please, call with any questions. 5)  Happy Holidays!!! Prescriptions: NITROSTAT 0.4 MG SUBL (NITROGLYCERIN) Place one tab under tongue for relief of chest pain every 5 minutes, max 3 doses in 15 minutes  #30 x 11   Entered and Authorized by:   Deatra Robinson MD   Signed by:   Deatra Robinson MD on 12/05/2010   Method used:   Electronically to        [facility] (retail)       [address]        [place], [place]  [address]       Ph: [phone]       Fax: [phone]   RxID:   [id number] LANTUS 100 UNIT/ML SOLN (INSULIN GLARGINE) Inject 40 units subcutaneously at night  #1 vial x 11   Entered and Authorized by:   Deatra Robinson MD   Signed by:   Deatra Robinson MD on 12/05/2010   Method used:   Electronically to        [facility].* (retail)       [address]       [place], [place]  [address]       Ph: [phone]       Fax: [phone]   RxID:   [id number]    Orders Added: 1)  Capillary Blood Glucose/CBG [82948] 2)  T-Lipid Profile [80061-22930] 3)  Est. Patient Level III [51761] 4)  Tdap => 72yrs IM [90715] 5)  Pneumococcal Vaccine [90732] 6)  Admin 1st Vaccine [90471] 7)  Admin of Any Addtl Vaccine [60737]   Immunizations Administered:  Tetanus Vaccine:    Vaccine Type: Tdap    Site: right deltoid    Mfr: GlaxoSmithKline    Dose: 0.5 ml    Route: IM    Given by: Chinita Pester RN    Exp. Date: 10/13/2012    Lot #: [id number]    VIS given: 11/11/08 version given December 05, 2010.  Pneumonia Vaccine:    Vaccine Type: Pneumovax  Site: right deltoid    Mfr: Merck    Dose: 0.5 ml    Route: IM    Given by: Chinita Pester RN    Exp. Date: 02/11/2012    Lot #: [id number]    VIS given: 11/29/09 version given December 05, 2010.   Immunizations Administered:  Tetanus Vaccine:    Vaccine Type: Tdap    Site: right deltoid    Mfr: GlaxoSmithKline    Dose: 0.5 ml    Route: IM    Given by: Chinita Pester RN    Exp. Date: 10/13/2012    Lot #: [id number]    VIS given: 11/11/08 version given December 05, 2010.  Pneumonia Vaccine:    Vaccine Type: Pneumovax    Site: right deltoid    Mfr: Merck    Dose: 0.5 ml    Route: IM    Given by: Chinita Pester RN    Exp. Date: 02/11/2012    Lot #: [id number]    VIS given: 11/29/09 version given December 05, 2010. Process Orders Check Orders  Results:     [facility]: ABN not required for this insurance Tests Sent for requisitioning (December 05, 2010 11:58 AM):     12/05/2010: [facility] -- T-Lipid Profile [phone] (signed)    Prevention & Chronic Care Immunizations   Influenza vaccine: Fluvax Non-MCR  (10/20/2010)   Influenza vaccine due: 08/26/2011    Tetanus booster: 12/05/2010: Tdap   Tetanus booster due: 12/05/2020    Pneumococcal vaccine: Pneumovax  (12/05/2010)   Pneumococcal vaccine due: 12/06/2015  Other Screening   Smoking status: current  (12/05/2010)   Smoking cessation counseling: yes  (12/05/2010)  Diabetes Mellitus   HgbA1C: 8.4  (11/22/2010)   Hemoglobin A1C due: 03/06/2011    Eye exam: Moderate non-proliferative diabetic retinopathy.  OU HIV retinopathy OU Visual acuity OD: 20/30      Visual acuity OS :20/25         (04/21/2009)   Diabetic eye exam action/deferral: Ophthalmology referral  (06/20/2010)   Eye exam due: 10/2009    Foot exam: yes  (12/05/2010)   Foot exam action/deferral: Do today   High risk foot: Not documented   Foot care education: Not documented   Foot exam due: 03/06/2011    Urine microalbumin/creatinine ratio: 14.2  (06/20/2010)   Urine microalbumin action/deferral: Ordered   Urine microalbumin/cr due: 12/06/2011    Diabetes flowsheet reviewed?: Yes   Progress toward A1C goal: Unchanged    Stage of readiness to change (diabetes management): Action  Lipids   Total Cholesterol: 167  (09/27/2009)   Lipid panel action/deferral: Lipid Panel ordered   LDL: * mg/dL  (84/69/6295)   LDL Direct: Not documented   HDL: 34  (09/27/2009)   Triglycerides: 418  (09/27/2009)   Lipid panel due: 12/06/2011    SGOT (AST): 20  (09/09/2010)   SGPT (ALT): 34  (09/09/2010)   Alkaline phosphatase: 80  (09/09/2010)   Total bilirubin: 0.3  (09/09/2010)   Liver panel due: 09/10/2011    Lipid flowsheet reviewed?: Yes   Progress toward LDL  goal: Unchanged    Stage of readiness to change (lipid management): Maintenance  Hypertension   Last Blood Pressure: 133 / 84  (12/05/2010)   Serum creatinine: 1.05  (09/09/2010)   Serum potassium 4.5  (09/09/2010)   Basic metabolic panel due: 09/10/2011    Hypertension flowsheet reviewed?: Yes   Progress toward BP goal: Unchanged    Stage of readiness to change (hypertension management):  Maintenance  Self-Management Support :   Personal Goals (by the next clinic visit) :     Personal A1C goal: 7  (06/20/2010)     Personal blood pressure goal: 130/80  (06/20/2010)     Personal LDL goal: 100  (06/20/2010)    Patient will work on the following items until the next clinic visit to reach self-care goals:     Medications and monitoring: take my medicines every day, bring all of my medications to every visit, examine my feet every day  (12/05/2010)     Eating: drink diet soda or water instead of juice or soda, eat more vegetables, use fresh or frozen vegetables, eat foods that are low in salt, eat baked foods instead of fried foods, eat fruit for snacks and desserts  (12/05/2010)     Activity: take a 30 minute walk every day  (12/05/2010)    Diabetes self-management support: Written self-care plan, Education handout  (12/05/2010)   Diabetes care plan printed   Diabetes education handout printed   Last diabetes self-management training by diabetes educator: 04/30/2009   Last medical nutrition therapy: 07/23/2007    Hypertension self-management support: Written self-care plan, Education handout  (12/05/2010)   Hypertension self-care plan printed.   Hypertension education handout printed    Lipid self-management support: Written self-care plan  (12/05/2010)   Lipid self-care plan printed.   Nursing Instructions: Give tetanus booster today Give Pneumovax today

## 2011-01-26 NOTE — Progress Notes (Signed)
Summary: Refill/gh  Phone Note Refill Request Message from:  Patient  Refills Requested: Medication #1:  PRAVASTATIN SODIUM 40 MG TABS Take one tablet by mouth once daily  Medication #2:  METFORMIN HCL 500 MG TABS Take one tablet by mouth daily in the morning  Method Requested: Electronic Initial call taken by: Angelina Ok RN,  May 06, 2010 1:57 PM  Follow-up for Phone Call        Rx denied because needs an office visit Follow-up by: Deatra Robinson MD,  May 08, 2010 4:26 PM  Additional Follow-up for Phone Call Additional follow up Details #1::        Rx denial called/faxed to pharmacy.  Message that pt needs an appointment. Additional Follow-up by: Angelina Ok RN,  May 16, 2010 2:32 PM

## 2011-01-26 NOTE — Assessment & Plan Note (Signed)
Summary: 40yr f/u [mkj]   Primary Provider:  Deatra Robinson MD  CC:  1 year follow up.  History of Present Illness: 40 yo M  HIV+, DM, HTN, HL. CD4 220 and VL <20 (09-09-10). Has had f/u in IM for his DM, under the excellent care of Dr Denton Meek. without complaints. FSG have been  ~200. Does not think lantus is helping him.   Preventive Screening-Counseling & Management  Alcohol-Tobacco     Alcohol drinks/day: very little     Alcohol type: occasionally     Smoking Status: current     Smoking Cessation Counseling: yes     Packs/Day: 1.0     Year Started: about 20 years  Caffeine-Diet-Exercise     Caffeine use/day: tea occassionally     Does Patient Exercise: no     Type of exercise: active at work  [employer] Use: yes  Current Medications (verified): 1)  Metformin Hcl 1000 Mg Tabs (Metformin Hcl) .... Take One Tablet By Mouth Twice Daily 2)  Glipizide 10 Mg Tabs (Glipizide) .... Take 1 Tablet By Mouth Twice A Day 3)  Dapsone 100 Mg Tabs (Dapsone) .... Take 1 Tablet By Mouth Once A Day 4)  Atripla 600-200-300 Mg  Tabs (Efavirenz-Emtricitab-Tenofovir) .... Once Daily 5)  Lisinopril-Hydrochlorothiazide 20-25 Mg Tabs (Lisinopril-Hydrochlorothiazide) .... Take One Tablet By Mouth Once Daily 6)  Pravastatin Sodium 40 Mg Tabs (Pravastatin Sodium) .... Take One Tablet By Mouth Once Daily 7)  Metformin Hcl 500 Mg Tabs (Metformin Hcl) .... Take One Tablet By Mouth Daily in The Morning 8)  Lantus 100 Unit/ml Soln (Insulin Glargine) .... Inject 40 Units Subcutaneously At Night 9)  Aspirin Adult Low Strength 81 Mg Tbec (Aspirin) .... Take One Tab By Mouth Once Daily 10)  Nitrostat 0.4 Mg Subl (Nitroglycerin) .... Place One Tab Under Tongue For Relief of Chest Pain Every 5 Minutes, Max 3 Doses in 15 Minutes 11)  Insulin Syringe 29g X 1/2" 0.5 Ml Misc (Insulin Syringe-Needle U-100) .... Use It To Draw Insulin As Prescribed.  Allergies (verified): 1)  ! Sulfa      Updated Prior Medication List: METFORMIN HCL 1000 MG TABS (METFORMIN HCL) Take one tablet by mouth twice daily GLIPIZIDE 10 MG TABS (GLIPIZIDE) Take 1 tablet by mouth twice a day DAPSONE 100 MG TABS (DAPSONE) Take 1 tablet by mouth once a day ATRIPLA 600-200-300 MG  TABS (EFAVIRENZ-EMTRICITAB-TENOFOVIR) once daily LISINOPRIL-HYDROCHLOROTHIAZIDE 20-25 MG TABS (LISINOPRIL-HYDROCHLOROTHIAZIDE) Take one tablet by mouth once daily PRAVASTATIN SODIUM 40 MG TABS (PRAVASTATIN SODIUM) Take one tablet by mouth once daily METFORMIN HCL 500 MG TABS (METFORMIN HCL) Take one tablet by mouth daily in the morning LANTUS 100 UNIT/ML SOLN (INSULIN GLARGINE) Inject 40 units subcutaneously at night ASPIRIN ADULT LOW STRENGTH 81 MG TBEC (ASPIRIN) Take one tab by mouth once daily NITROSTAT 0.4 MG SUBL (NITROGLYCERIN) Place one tab under tongue for relief of chest pain every 5 minutes, max 3 doses in 15 minutes INSULIN SYRINGE 29G X 1/2" 0.5 ML MISC (INSULIN SYRINGE-NEEDLE U-100) Use it to draw insulin as prescribed.  Current Allergies (reviewed today): ! SULFA Past History:  Past medical, surgical, family and social histories (including risk factors) reviewed, and no changes noted (except as noted below).  Past Medical History: Reviewed history from 09/27/2009 and no changes required. HIV+ DM2 Hyperlipidemia Smoker  Family History: Reviewed history from 05/17/2007 and no changes required. Family History of CAD Male 1st degree relative <60 Family History of CAD Male 1st  degree relative <50 Family History Diabetes 1st degree relative  Social History: Reviewed history from 05/17/2007 and no changes required. Current Smoker- 2ppd. nerves shot, couldn't quit now.  Alcohol use-yes. never to point of passing out.   same girlfriend x 7 yrs. no prev txf, no prev male male intercourse. unclear how many heterosexual partners he has had (>10, <100).   Review of Systems       w down 7#. no neuropathy in  LE, occasionally feet are cold. saw ophtho last year.   Vital Signs:  Patient profile:   40 year old male Height:      73 inches (185.42 cm) Weight:      242.4 pounds (110.18 kg) BMI:     32.10 Temp:     98.0 degrees F (36.67 degrees C) oral Pulse rate:   89 / minute BP sitting:   113 / 71  (left arm)  Vitals Entered By: Baxter Hire) (October 19, 2010 9:32 AM) CC: 1 year follow up Pain Assessment Patient in pain? no      Nutritional Status BMI of > 30 = obese Nutritional Status Detail appetite is good per patient  Have you ever been in a relationship where you felt threatened, hurt or afraid?No   Does patient need assistance? Functional Status Self care Ambulation Normal        Medication Adherence: 10/19/2010   Adherence to medications reviewed with patient. Counseling to provide adequate adherence provided   Prevention For Positives: 10/19/2010   Safe sex practices discussed with patient. Condoms offered.                             Physical Exam  General:  well-developed, well-nourished, and well-hydrated.   Eyes:  pupils equal, pupils round, and pupils reactive to light.   Mouth:  pharynx pink and moist, no exudates, and poor dentition.   Neck:  no masses.   Lungs:  normal respiratory effort and normal breath sounds.   Heart:  normal rate, regular rhythm, and no murmur.   Abdomen:  soft, non-tender, and normal bowel sounds.    Diabetes Management Exam:    Foot Exam (with socks and/or shoes not present):       Sensory-Pinprick/Light touch:          Left dorsal foot (L-5): normal          Right dorsal foot (L-5): normal       Inspection:          Left foot: normal          Right foot: normal       Nails:          Left foot: fungal infection          Right foot: fungal infection   Impression & Recommendations:  Problem # 1:  AIDS (ICD-042)  doing well, offered condoms. will cont his current rx. d/c dapsone. get flu shot today. get into  dental.  return to clinic 4-5 months.   Orders: Ophthalmology Referral (Ophthalmology)  Problem # 2:  TOBACCO USER (ICD-305.1) counseled to quit.   Problem # 3:  DIABETES-TYPE 2 (ICD-250.00)  due for eye exam. will refer.  His updated medication list for this problem includes:    Metformin Hcl 1000 Mg Tabs (Metformin hcl) .Marland Kitchen... Take one tablet by mouth twice daily    Glipizide 10 Mg Tabs (Glipizide) .Marland Kitchen... Take 1 tablet by mouth twice a  day    Lisinopril-hydrochlorothiazide 20-25 Mg Tabs (Lisinopril-hydrochlorothiazide) .Marland Kitchen... Take one tablet by mouth once daily    Metformin Hcl 500 Mg Tabs (Metformin hcl) .Marland Kitchen... Take one tablet by mouth daily in the morning    Lantus 100 Unit/ml Soln (Insulin glargine) ..... Inject 40 units subcutaneously at night    Aspirin Adult Low Strength 81 Mg Tbec (Aspirin) .Marland Kitchen... Take one tab by mouth once daily  Orders: Ophthalmology Referral (Ophthalmology)  Problem # 4:  HYPERLIPIDEMIA (ICD-272.4) will f/u lipids, LFTs at next visit.  His updated medication list for this problem includes:    Pravastatin Sodium 40 Mg Tabs (Pravastatin sodium) .Marland Kitchen... Take one tablet by mouth once daily  Other Orders: Est. Patient Level IV (16109) Future Orders: T-CD4SP ([facility]) (CD4SP) ... 01/17/2011 T-HIV Viral Load [phone]) ... 01/17/2011 T-Comprehensive Metabolic Panel [id number]) ... 01/17/2011 T-CBC w/Diff ([id number]) ... 01/17/2011 T-RPR (Syphilis) [phone]) ... 01/17/2011 T-Lipid Profile [phone]) ... 01/17/2011  Appended Document: Orders Update-flu shot    Clinical Lists Changes  Orders: Added new Service order of Influenza Vaccine NON MCR (36644) - Signed Observations: Added new observation of FLU VAX#1VIS: 07/19/10 version given October 20, 2010. (10/20/2010 8:47) Added new observation of FLU VAXLOT: 11033P (10/20/2010 8:47) Added new observation of FLU VAX EXP: 03/26/2011 (10/20/2010 8:47) Added new observation of FLU VAXBY: Kathi Simpers  [facility]) (10/20/2010 8:47) Added new observation of FLU VAXRTE: IM (10/20/2010 8:47) Added new observation of FLU VAX DSE: 0.5 ml (10/20/2010 8:47) Added new observation of FLU VAXMFR: [employer]is (10/20/2010 8:47) Added new observation of FLU VAX SITE: right deltoid (10/20/2010 8:47) Added new observation of FLU VAX: Fluvax Non-MCR (10/20/2010 8:47)       Influenza Vaccine    Vaccine Type: Fluvax Non-MCR    Site: right deltoid    Mfr: [employer]is    Dose: 0.5 ml    Route: IM    Given by: Kathi Simpers CMA(AAMA)    Exp. Date: 03/26/2011    Lot #: [id number]    VIS given: 07/19/10 version given October 20, 2010.  Flu Vaccine Consent Questions    Do you have a history of severe allergic reactions to this vaccine? no    Any prior history of allergic reactions to egg and/or gelatin? no    Do you have a sensitivity to the preservative Thimersol? no    Do you have a past history of Guillan-Barre Syndrome? no    Do you currently have an acute febrile illness? no    Have you ever had a severe reaction to latex? no    Vaccine information given and explained to patient? yes

## 2011-01-26 NOTE — Assessment & Plan Note (Signed)
Summary: acute-refill request([facility])/cfb   Vital Signs:  Patient profile:   40 year old male Height:      73 inches Weight:      249.4 pounds BMI:     33.02 Temp:     97.4 degrees F oral Pulse rate:   86 / minute BP sitting:   131 / 81  (right arm)  Vitals Entered By: Filomena Jungling NT II (June 20, 2010 2:02 PM) CC: REFILL ON MEDICINE Is Patient Diabetic? Yes Did you bring your meter with you today? No Pain Assessment Patient in pain? no      CBG Result 332  Have you ever been in a relationship where you felt threatened, hurt or afraid?No   Does patient need assistance? Functional Status Self care Ambulation Normal   Primary Care Provider:  Deatra Robinson MD  CC:  REFILL ON MEDICINE.  History of Present Illness: Pt is a 40 year old Male with PMH of HIV, DM, HTN, HL, who came to the [facility] for routine follow up. He wants refills all of his meds except HIV meds. He has been doing well and checks his CBG daily and usually runs 200s and has been taking all his meds as instructed. Current smoker, 1PPD, no drug or ETOH abuse. Denies any chest pain, SOB, sweating, weight changes, or vision change.    Preventive Screening-Counseling & Management  Alcohol-Tobacco     Alcohol drinks/day: very little     Alcohol type: occasionally     Smoking Status: current     Smoking Cessation Counseling: yes     Packs/Day: 1.5 ppd     Year Started: about 20 years  Caffeine-Diet-Exercise     Caffeine use/day: tea occassionally     Does Patient Exercise: no  Problems Prior to Update: 1)  Tachycardia  (ICD-785.0) 2)  Tobacco User  (ICD-305.1) 3)  Chest Pain  (ICD-786.50) 4)  Diabetes-type 2  (ICD-250.00) 5)  Hyperlipidemia  (ICD-272.4) 6)  Headache  (ICD-784.0) 7)  Hypertension  (ICD-401.9) 8)  Back Pain  (ICD-724.5) 9)  Furuncle  (ICD-680.9) 10)  Insomnia  (ICD-780.52) 11)  Aids  (ICD-042) 12)  Candidiasis, Mouth (THRUSH)  (ICD-112.0) 13)  Family History Diabetes 1st Degree  Relative  (ICD-V18.0) 14)  Family History of Cad Male 1st Degree Relative <50  (ICD-V17.3) 15)  Family History of Cad Male 1st Degree Relative <60  (ICD-V16.49) 16)  Aodm  (ICD-250.00)  Medications Prior to Update: 1)  Metformin Hcl 1000 Mg Tabs (Metformin Hcl) .... Take One Tablet By Mouth Twice Daily 2)  Glipizide 10 Mg Tabs (Glipizide) .... Take 1 Tablet By Mouth Twice A Day 3)  Dapsone 100 Mg Tabs (Dapsone) .... Take 1 Tablet By Mouth Once A Day 4)  Atripla 600-200-300 Mg  Tabs (Efavirenz-Emtricitab-Tenofovir) .... Once Daily 5)  Lisinopril-Hydrochlorothiazide 20-25 Mg Tabs (Lisinopril-Hydrochlorothiazide) .... Take One Tablet By Mouth Once Daily 6)  Pravastatin Sodium 40 Mg Tabs (Pravastatin Sodium) .... Take One Tablet By Mouth Once Daily 7)  Metformin Hcl 500 Mg Tabs (Metformin Hcl) .... Take One Tablet By Mouth Daily in The Morning 8)  Lantus 100 Unit/ml Soln (Insulin Glargine) .... Inject 35 Units Subcutaneously At Night 9)  Aspirin Adult Low Strength 81 Mg Tbec (Aspirin) .... Take One Tab By Mouth Once Daily 10)  Nitrostat 0.4 Mg Subl (Nitroglycerin) .... Place One Tab Under Tongue For Relief of Chest Pain Every 5 Minutes, Max 3 Doses in 15 Minutes 11)  Insulin Syringe 29g X 1/2" 0.5  Ml Misc (Insulin Syringe-Needle U-100) .... Use It To Draw Insulin As Prescribed.  Current Medications (verified): 1)  Metformin Hcl 1000 Mg Tabs (Metformin Hcl) .... Take One Tablet By Mouth Twice Daily 2)  Glipizide 10 Mg Tabs (Glipizide) .... Take 1 Tablet By Mouth Twice A Day 3)  Dapsone 100 Mg Tabs (Dapsone) .... Take 1 Tablet By Mouth Once A Day 4)  Atripla 600-200-300 Mg  Tabs (Efavirenz-Emtricitab-Tenofovir) .... Once Daily 5)  Lisinopril-Hydrochlorothiazide 20-25 Mg Tabs (Lisinopril-Hydrochlorothiazide) .... Take One Tablet By Mouth Once Daily 6)  Pravastatin Sodium 40 Mg Tabs (Pravastatin Sodium) .... Take One Tablet By Mouth Once Daily 7)  Metformin Hcl 500 Mg Tabs (Metformin Hcl) ....  Take One Tablet By Mouth Daily in The Morning 8)  Lantus 100 Unit/ml Soln (Insulin Glargine) .... Inject 35 Units Subcutaneously At Night 9)  Aspirin Adult Low Strength 81 Mg Tbec (Aspirin) .... Take One Tab By Mouth Once Daily 10)  Nitrostat 0.4 Mg Subl (Nitroglycerin) .... Place One Tab Under Tongue For Relief of Chest Pain Every 5 Minutes, Max 3 Doses in 15 Minutes 11)  Insulin Syringe 29g X 1/2" 0.5 Ml Misc (Insulin Syringe-Needle U-100) .... Use It To Draw Insulin As Prescribed.  Allergies (verified): 1)  ! Sulfa  Past History:  Past Medical History: Last updated: 09/27/2009 HIV+ DM2 Hyperlipidemia Smoker  Family History: Last updated: 05/17/2007 Family History of CAD Male 1st degree relative <60 Family History of CAD Male 1st degree relative <50 Family History Diabetes 1st degree relative  Social History: Last updated: 05/17/2007 Current Smoker- 2ppd. nerves shot, couldn't quit now.  Alcohol use-yes. never to point of passing out.   same girlfriend x 7 yrs. no prev txf, no prev male male intercourse. unclear how many heterosexual partners he has had (>10, <100).   Risk Factors: Alcohol Use: very little (06/20/2010) Caffeine Use: tea occassionally (06/20/2010) Exercise: no (06/20/2010)  Risk Factors: Smoking Status: current (06/20/2010) Packs/Day: 1.5 ppd (06/20/2010)  Family History: Reviewed history from 05/17/2007 and no changes required. Family History of CAD Male 1st degree relative <60 Family History of CAD Male 1st degree relative <50 Family History Diabetes 1st degree relative  Social History: Reviewed history from 05/17/2007 and no changes required. Current Smoker- 2ppd. nerves shot, couldn't quit now.  Alcohol use-yes. never to point of passing out.   same girlfriend x 7 yrs. no prev txf, no prev male male intercourse. unclear how many heterosexual partners he has had (>10, <100).   Review of Systems  The patient denies fever, dyspnea on  exertion, peripheral edema, prolonged cough, headaches, hemoptysis, abdominal pain, and melena.    Physical Exam  General:  alert, well-developed, well-nourished, well-hydrated, and overweight-appearing.   Head:  normocephalic.   Nose:  no nasal discharge.   Mouth:  pharynx pink and moist.   Neck:  supple.   Lungs:  normal respiratory effort, normal breath sounds, no crackles, and no wheezes.   Heart:  normal rate, regular rhythm, no murmur, no gallop, and no JVD.   Abdomen:  soft, non-tender, normal bowel sounds, and no distention.   Msk:  normal ROM, no joint tenderness, no joint swelling, and no joint warmth.   Pulses:  2+ Extremities:  No edema. Neurologic:  alert & oriented X3, cranial nerves II-XII intact, strength normal in all extremities, sensation intact to light touch, and gait normal.     Impression & Recommendations:  Problem # 1:  DIABETES-TYPE 2 (ICD-250.00) Assessment Deteriorated His A1C goes up to  8.2 from 7.4. His CBG usually runs 200s, will increase lantus to 40 units from 35, also advised him exercise and weight loss, quit smoking. Will recheck in 3 months. Will have eye referral for yearly eye exam, which is due. His updated medication list for this problem includes:    Metformin Hcl 1000 Mg Tabs (Metformin hcl) .Marland Kitchen... Take one tablet by mouth twice daily    Glipizide 10 Mg Tabs (Glipizide) .Marland Kitchen... Take 1 tablet by mouth twice a day    Lisinopril-hydrochlorothiazide 20-25 Mg Tabs (Lisinopril-hydrochlorothiazide) .Marland Kitchen... Take one tablet by mouth once daily    Metformin Hcl 500 Mg Tabs (Metformin hcl) .Marland Kitchen... Take one tablet by mouth daily in the morning    Lantus 100 Unit/ml Soln (Insulin glargine) ..... Inject 40 units subcutaneously at night    Aspirin Adult Low Strength 81 Mg Tbec (Aspirin) .Marland Kitchen... Take one tab by mouth once daily  Orders: T- Capillary Blood Glucose (04540) T-Hgb A1C (in-house) ([id number])  Labs Reviewed: Creat: 0.96 (09/27/2009)     Last Eye Exam:  Moderate non-proliferative diabetic retinopathy.  OU HIV retinopathy OU Visual acuity OD: 20/30      Visual acuity OS :20/25        (04/21/2009) Reviewed HgBA1c results: 8.2 (06/20/2010)  7.4 (08/10/2009)  Problem # 2:  HYPERTENSION (ICD-401.9) Assessment: Unchanged His BP well controlled and will continue to take current meds.  His updated medication list for this problem includes:    Lisinopril-hydrochlorothiazide 20-25 Mg Tabs (Lisinopril-hydrochlorothiazide) .Marland Kitchen... Take one tablet by mouth once daily  BP today: 131/81 Prior BP: 133/88 (09/27/2009)  Prior 10 Yr Risk Heart Disease: Not enough information (04/29/2009)  Labs Reviewed: K+: 4.3 (09/27/2009) Creat: : 0.96 (09/27/2009)   Chol: 167 (09/27/2009)   HDL: 34 (09/27/2009)   LDL: * mg/dL (78/29/5621)   TG: 308 (09/27/2009)  Problem # 3:  TOBACCO USER (ICD-305.1)  Encouraged smoking cessation and discussed different methods for smoking cessation.   Problem # 4:  AIDS (ICD-042) Assessment: Unchanged His CD4 has been stable on ART. He has been f/u by Dr. [name].   Problem # 5:  HYPERLIPIDEMIA (ICD-272.4) Assessment: Unchanged Will refill pravastatin for him and will continue this. May check FLP at next visit.  His updated medication list for this problem includes:    Pravastatin Sodium 40 Mg Tabs (Pravastatin sodium) .Marland Kitchen... Take one tablet by mouth once daily  Labs Reviewed: SGOT: 23 (09/27/2009)   SGPT: 36 (09/27/2009)  Prior 10 Yr Risk Heart Disease: Not enough information (04/29/2009)   HDL:34 (09/27/2009), 32 (12/28/2008)  LDL:* mg/dL (65/78/4696), See Comment mg/dL (29/52/8413)  KGMW:102 (09/27/2009), 188 (12/28/2008)  Trig:418 (09/27/2009), 837 (12/28/2008)  Complete Medication List: 1)  Metformin Hcl 1000 Mg Tabs (Metformin hcl) .... Take one tablet by mouth twice daily 2)  Glipizide 10 Mg Tabs (Glipizide) .... Take 1 tablet by mouth twice a day 3)  Dapsone 100 Mg Tabs (Dapsone) .... Take 1 tablet by mouth once  a day 4)  Atripla 600-200-300 Mg Tabs (Efavirenz-emtricitab-tenofovir) .... Once daily 5)  Lisinopril-hydrochlorothiazide 20-25 Mg Tabs (Lisinopril-hydrochlorothiazide) .... Take one tablet by mouth once daily 6)  Pravastatin Sodium 40 Mg Tabs (Pravastatin sodium) .... Take one tablet by mouth once daily 7)  Metformin Hcl 500 Mg Tabs (Metformin hcl) .... Take one tablet by mouth daily in the morning 8)  Lantus 100 Unit/ml Soln (Insulin glargine) .... Inject 40 units subcutaneously at night 9)  Aspirin Adult Low Strength 81 Mg Tbec (Aspirin) .... Take one tab by mouth  once daily 10)  Nitrostat 0.4 Mg Subl (Nitroglycerin) .... Place one tab under tongue for relief of chest pain every 5 minutes, max 3 doses in 15 minutes 11)  Insulin Syringe 29g X 1/2" 0.5 Ml Misc (Insulin syringe-needle u-100) .... Use it to draw insulin as prescribed.  Other Orders: Ophthalmology Referral (Ophthalmology) T-Urine Microalbumin w/creat. ratio [phone])  Patient Instructions: 1)  Please schedule a follow-up appointment in 3 months. 2)  Will call you if any abnormal labs. 3)  Stop Smoking Tips: Choose a Quit date. Cut down before the Quit date. decide what you will do as a substitute when you feel the urge to smoke(gum,toothpick,exercise). 4)  It is important that you exercise regularly at least 20 minutes 5 times a week. If you develop chest pain, have severe difficulty breathing, or feel very tired , stop exercising immediately and seek medical attention. 5)  You need to lose weight. Consider a lower calorie diet and regular exercise.  Prescriptions: ASPIRIN ADULT LOW STRENGTH 81 MG TBEC (ASPIRIN) Take one tab by mouth once daily  #30 x 6   Entered and Authorized by:   Jackson Latino MD   Signed by:   Jackson Latino MD on 06/20/2010   Method used:   Electronically to        [facility] [address] (retail)       [address], [address]       [place], [place]  [address]        Ph: [phone]       Fax: [phone]   RxID:   [id number] LANTUS 100 UNIT/ML SOLN (INSULIN GLARGINE) Inject 40 units subcutaneously at night  # 2 vials x 3   Entered and Authorized by:   Jackson Latino MD   Signed by:   Jackson Latino MD on 06/20/2010   Method used:   Electronically to        [facility] [address] (retail)       [address], [address]       [place], [place]  [address]       Ph: [phone]       Fax: [phone]   RxID:   [id number] PRAVASTATIN SODIUM 40 MG TABS (PRAVASTATIN SODIUM) Take one tablet by mouth once daily  #30 Each x 8   Entered and Authorized by:   Jackson Latino MD   Signed by:   Jackson Latino MD on 06/20/2010   Method used:   Electronically to        [facility] [address] (retail)       [address], [address]       [place], [place]  [address]       Ph: [phone]       Fax: [phone]   RxID:   [id number] GLIPIZIDE 10 MG TABS (GLIPIZIDE) Take 1 tablet by mouth twice a day  #60 Each x 6   Entered and Authorized by:   Jackson Latino MD   Signed by:   Jackson Latino MD on 06/20/2010   Method used:   Electronically to        [facility] [address] (retail)       [address], [address]       [place], [place]  [address]       Ph: [phone]       Fax: [phone]   RxID:   [id number]   Prevention & Chronic Care Immunizations   Influenza vaccine: Fluvax 3+  (09/27/2009)    Tetanus booster: Not documented    Pneumococcal vaccine: Not documented  Other Screening   Smoking status: current  (06/20/2010)   Smoking cessation counseling: yes  (06/20/2010)  Diabetes Mellitus   HgbA1C: 8.2  (06/20/2010)    Eye exam: Moderate non-proliferative diabetic retinopathy.  OU HIV retinopathy OU Visual acuity OD: 20/30      Visual acuity OS :20/25         (04/21/2009)   Diabetic eye exam action/deferral:  Ophthalmology referral  (06/20/2010)   Eye exam due: 10/2009    Foot exam: yes  (06/02/2009)   High risk foot: Not documented   Foot care education: Not documented    Urine microalbumin/creatinine ratio: Not documented   Urine microalbumin action/deferral: Ordered    Diabetes flowsheet reviewed?: Yes   Progress toward A1C goal: Deteriorated  Lipids   Total Cholesterol: 167  (09/27/2009)   LDL: * mg/dL  (66/44/0347)   LDL Direct: Not documented   HDL: 34  (09/27/2009)   Triglycerides: 418  (09/27/2009)   Lipid panel due: 05/16/2008    SGOT (AST): 23  (09/27/2009)   SGPT (ALT): 36  (09/27/2009)   Alkaline phosphatase: 89  (09/27/2009)   Total bilirubin: 0.3  (09/27/2009)    Lipid flowsheet reviewed?: Yes   Progress toward LDL goal: Unchanged  Hypertension   Last Blood Pressure: 131 / 81  (06/20/2010)   Serum creatinine: 0.96  (09/27/2009)   Serum potassium 4.3  (09/27/2009)    Hypertension flowsheet reviewed?: Yes   Progress toward BP goal: Unchanged  Self-Management Support :   Personal Goals (by the next clinic visit) :     Personal A1C goal: 7  (06/20/2010)     Personal blood pressure goal: 130/80  (06/20/2010)     Personal LDL goal: 100  (06/20/2010)    Patient will work on the following items until the next clinic visit to reach self-care goals:     Medications and monitoring: take my medicines every day, check my blood sugar, check my blood pressure, bring all of my medications to every visit, weigh myself weekly  (06/20/2010)     Eating: drink diet soda or water instead of juice or soda, eat more vegetables  (06/20/2010)     Activity: take a 30 minute walk every day  (06/20/2010)    Diabetes self-management support: Written self-care plan  (06/20/2010)   Diabetes care plan printed   Last diabetes self-management training by diabetes educator: 04/30/2009   Last medical nutrition therapy: 07/23/2007    Hypertension self-management support: Written self-care  plan  (06/20/2010)   Hypertension self-care plan printed.    Lipid self-management support: Written self-care plan  (06/20/2010)   Lipid self-care plan printed.   Nursing Instructions: Refer for screening diabetic eye exam (see order)    Laboratory Results   Blood Tests   Date/Time Received: June 20, 2010 2:23 PM  Date/Time Reported: Burke Keels  June 20, 2010 2:23 PM   HGBA1C: 8.2%   (Normal Range: Non-Diabetic - 3-6%   Control Diabetic - 6-8%) CBG Random:: 332mg /dL      Process Orders Check Orders Results:     [facility]: ABN not required for this insurance Tests Sent for requisitioning (June 20, 2010 6:49 PM):  06/20/2010: [facility] -- T-Urine Microalbumin w/creat. ratio [[id number]] (signed)

## 2011-01-26 NOTE — Miscellaneous (Signed)
Summary: RW Financial Update  Clinical Lists Changes  Observations: Added new observation of PCTFPL: 240.07  (11/21/2010 13:42) Added new observation of HOUSEINCOME: 04540  (11/21/2010 13:42) Added new observation of FINASSESSDT: 10/26/2010  (11/21/2010 13:42) Added new observation of YEARLYEXPEN: 500  (11/21/2010 13:42)

## 2011-01-26 NOTE — Progress Notes (Signed)
Summary: refill of 500 mg metformin dose/dmr  Phone Note Call from Patient Call back at Home Phone [phone]   Caller: Patient Summary of Call: patient left message requested call back- left him a message will try again later. Initial call taken by: Jamison Neighbor RD,CDE,  November 11, 2010 10:00 AM  Follow-up for Phone Call        Spoke with patient: He reports that he was told that he needs to speak with me to get his metformin 500 mg per day to take the 1500 mg with breakfast he is supposed to be taking refilled. Reports his blood sugar is a little less than 200  after breakfast when he checks once a day. He feels fine, has been tkaing this for some time, needs refill, loose stool for years, lantus 35 units a day.   Discussed that I would check with trage to see wht 500mg  dose of metformin not being refilled and that note 06/20/10 reflects that his dose of lantus is supposed ot be 40 units. [place] verbalized understandng and will increase the Lantus to this new does. Follow-up by: Jamison Neighbor RD,CDE,  November 11, 2010 11:23 AM  Additional Follow-up for Phone Call Additional follow up Details #1::        Spoke with triage nurse: they said doctors notes do not indicate that they realize he was on 1500 mg in the am and that patient needs to meet with doctor prior to refill.  Defer to triage for refill and noticfy patient of the same. Additional Follow-up by: Jamison Neighbor RD,CDE,  November 11, 2010 12:03 PM

## 2011-01-26 NOTE — Progress Notes (Signed)
Summary: med refill/gp  Phone Note Refill Request Message from:  Patient on June 09, 2010 2:19 PM  Refills Requested: Medication #1:  METFORMIN HCL 1000 MG TABS Take one tablet by mouth twice daily  Medication #2:  LISINOPRIL-HYDROCHLOROTHIAZIDE 20-25 MG TABS Take one tablet by mouth once daily Appt. scheduled June 20, 2010.   Method Requested: Electronic Initial call taken by: Chinita Pester RN,  June 09, 2010 2:19 PM  Follow-up for Phone Call        Refill approved-nurse to complete Follow-up by: Deatra Robinson MD,  June 10, 2010 12:28 PM    Prescriptions: LISINOPRIL-HYDROCHLOROTHIAZIDE 20-25 MG TABS (LISINOPRIL-HYDROCHLOROTHIAZIDE) Take one tablet by mouth once daily  #30 x 11   Entered and Authorized by:   Deatra Robinson MD   Signed by:   Deatra Robinson MD on 06/10/2010   Method used:   Electronically to        [facility] [address] (retail)       [address], [address]       [place], [place]  [address]       Ph: [phone]       Fax: [phone]   RxID:   [id number] METFORMIN HCL 1000 MG TABS (METFORMIN HCL) Take one tablet by mouth twice daily  #60 Each x 11   Entered and Authorized by:   Deatra Robinson MD   Signed by:   Deatra Robinson MD on 06/10/2010   Method used:   Electronically to        [facility] [address] (retail)       [address], [address]       [place], [place]  [address]       Ph: [phone]       Fax: [phone]   RxID:   [id number]

## 2011-01-26 NOTE — Miscellaneous (Signed)
Summary: Orders Update  Clinical Lists Changes  Orders: Added new Test order of T-CBC w/Diff (85025-10010) - Signed Added new Test order of T-CD4SP ([facility]) (CD4SP) - Signed Added new Test order of T-Comprehensive Metabolic Panel (80053-22900) - Signed Added new Test order of T-HIV Viral Load (87536-83319) - Signed Added new Test order of T-RPR (Syphilis) ([id number]) - Signed 

## 2011-01-26 NOTE — Assessment & Plan Note (Signed)
Summary: NEED MEDICATION/SB.   Vital Signs:  Patient profile:   40 year old Crosby Height:      73 inches (185.42 cm) Weight:      242.0 pounds (110.00 kg) BMI:     32.04 Temp:     97.0 degrees F (36.11 degrees C) oral Pulse rate:   80 / minute BP sitting:   126 / 79  (right arm) Cuff size:   regular  Vitals Entered By: Theotis Barrio NT II (November 22, 2010 11:43 AM) CC: ? MEDICATION DOSAGE CHANGE  Is Patient Diabetic? Yes Did you bring your meter with you today? No Pain Assessment Patient in pain? no      Nutritional Status BMI of > 30 = obese  Have you ever been in a relationship where you felt threatened, hurt or afraid?No   Does patient need assistance? Functional Status Self care Ambulation Normal   Primary Care Provider:  Deatra Robinson MD  CC:  ? MEDICATION DOSAGE CHANGE .  History of Present Illness: This is a 40 year old with hx of HIV, type II DM, hyperlipidemia and HTN who presents for medication refill.  Pt called in on 11/23 asking for a refill on his metformin 500 mg  but had not been seen since 10/19/10 and thus the request was denied.  With regards to pts DM, the patient has been trying to watch his diet and checks his feet daily.  Pt takes his CBG's at random times during the day and but fasting CBG's are typically in the 170-180 range.  Pt is currently taking 35 units of lantus daily, Glipizide, and 1500mg  metformin am, 1000 pm.  Pt denies any recent symptoms of hypoglycemia .  Pts HIV is under good control and he last saw Dr. [name] back in June.  At that time, pts CD4 count was 220 with an undetectable viral load. Pt is currently on atripla and takes it regularly.    Pt does have hypertension, but does not check his BP outside of the office visits. Pt denies any headaches, change in vision, chest pain or SOB.    Anticoagulation Management History:      Positive risk factors for bleeding include presence of serious comorbidities.  Negative risk  factors for bleeding include an age less than 93 years old.  The bleeding index is 'intermediate risk'.  Positive CHADS2 values include History of HTN and History of Diabetes.  Negative CHADS2 values include Age > 35 years old.     Preventive Screening-Counseling & Management  Alcohol-Tobacco     Alcohol drinks/day: very little     Alcohol type: occasionally     Smoking Status: current     Smoking Cessation Counseling: yes     Packs/Day: 1.0     Year Started: about 20 years  Caffeine-Diet-Exercise     Caffeine use/day: tea occassionally     Does Patient Exercise: no     Type of exercise: active at work  Allergies: 1)  ! Sulfa  Past History:  Past Medical History: Last updated: 09/27/2009 HIV+ DM2 Hyperlipidemia Smoker  Family History: Reviewed history from 05/17/2007 and no changes required. Family History of CAD Crosby 1st degree relative <60 Family History of CAD Crosby 1st degree relative <50 Family History Diabetes 1st degree relative  Social History: Reviewed history from 05/17/2007 and no changes required. Current Smoker- 2ppd. nerves shot, couldn't quit now.  Alcohol use-yes. never to point of passing out.   same girlfriend x 7 yrs. no  prev txf, no prev Crosby Crosby intercourse. unclear how many heterosexual partners he has had (>10, <100).   Review of Systems       Negative as per HPI.   Physical Exam  General:  alert and well-developed.   Eyes:  vision grossly intact, pupils equal, pupils round, and pupils reactive to light.   Nose:  no nasal discharge.   Mouth:  pharynx pink and moist.   Neck:  supple.   Lungs:  normal respiratory effort, no accessory muscle use, no crackles, and no wheezes.   Heart:  normal rate, regular rhythm, no murmur, no gallop, and no rub.   Abdomen:  soft, non-tender, normal bowel sounds, no distention, and no masses.   Pulses:  2+ dp/pt pulses bilaterally.  Extremities:  No edema.  Neurologic:  alert & oriented X3, cranial  nerves II-XII intact, and strength normal in all extremities.     Impression & Recommendations:  Problem # 1:  DIABETES-TYPE 2 (ICD-250.00) Will check CBG and A1C today.  Given pts past A1C of 8.2 back in June and report of fasting CBGs in the 170-180 range, I will increase the patient's lantus to 38 units daily.  Per the medical record, pt was instructed to be on 40u but the pt insists that he has only been taking 35.  I will continue the pt on his glipizide but will decrease the pts metformin dose to 1000 two times a day.  I am alo referring the pt to see Jamison Neighbor for further assistance with this pt's DM managment.  Because the pt has been taking his CBG's at random, I instructed the pt to check his CBG's once daily, at breakfast 2 days weekly, luch 2 days weekly, and dinner 2 days weekly between and 1 hour after his meal.  This will give [place] a better idea of the pts post prandial glucose status while encouraging compliance.  The pt was instructed to bring his meter to the next visit.   The following medications were removed from the medication list:    Metformin Hcl 500 Mg Tabs (Metformin hcl) .Marland Kitchen... Take one tablet by mouth daily in the morning His updated medication list for this problem includes:    Metformin Hcl 1000 Mg Tabs (Metformin hcl) .Marland Kitchen... Take one tablet by mouth twice daily    Glipizide 10 Mg Tabs (Glipizide) .Marland Kitchen... Take 1 tablet by mouth twice a day    Lisinopril-hydrochlorothiazide 20-25 Mg Tabs (Lisinopril-hydrochlorothiazide) .Marland Kitchen... Take one tablet by mouth once daily    Lantus 100 Unit/ml Soln (Insulin glargine) ..... Inject 38 units subcutaneously at night    Aspirin Adult Low Strength 81 Mg Tbec (Aspirin) .Marland Kitchen... Take one tab by mouth once daily  Problem # 2:  AIDS (ICD-042) This appears to be under good control at this point given his last viral load and CD4 count.  Will defer further managment to Dr. [name].  CD4 count and viral load will not be rechecked today as  initially planned as pts insurance grant will not cover it if ordered by this office.  Currently, pt has orders for these labs in Jan.  Problem # 3:  HYPERTENSION (ICD-401.9) Stable today,  BP within goal.  Cont to monitor.  His updated medication list for this problem includes:    Lisinopril-hydrochlorothiazide 20-25 Mg Tabs (Lisinopril-hydrochlorothiazide) .Marland Kitchen... Take one tablet by mouth once daily  Problem # 4:  Preventive Health Care (ICD-V70.0) Pt states that he has already recived a flu shot this  year.   Complete Medication List: 1)  Metformin Hcl 1000 Mg Tabs (Metformin hcl) .... Take one tablet by mouth twice daily 2)  Glipizide 10 Mg Tabs (Glipizide) .... Take 1 tablet by mouth twice a day 3)  Atripla 600-200-300 Mg Tabs (Efavirenz-emtricitab-tenofovir) .... Once daily 4)  Lisinopril-hydrochlorothiazide 20-25 Mg Tabs (Lisinopril-hydrochlorothiazide) .... Take one tablet by mouth once daily 5)  Pravastatin Sodium 40 Mg Tabs (Pravastatin sodium) .... Take one tablet by mouth once daily 6)  Lantus 100 Unit/ml Soln (Insulin glargine) .... Inject 38 units subcutaneously at night 7)  Aspirin Adult Low Strength 81 Mg Tbec (Aspirin) .... Take one tab by mouth once daily 8)  Nitrostat 0.4 Mg Subl (Nitroglycerin) .... Place one tab under tongue for relief of chest pain every 5 minutes, max 3 doses in 15 minutes 9)  Insulin Syringe 29g X 1/2" 0.5 Ml Misc (Insulin syringe-needle u-100) .... Use it to draw insulin as prescribed.  Other Orders: T-Hgb A1C (in-house) ([id number]) Diabetic Clinic Referral (Diabetic)  Anticoagulation Management Assessment/Plan:            Patient Instructions: 1)  Please make an follow up appointment with your PCP in one month.  2)  Continue to check your blood sugar and bring your montitor to the next appointment.   3)  Please make an appointment with Mitchell Heir.  4)  Call if you have any problems.  Prescriptions: METFORMIN HCL 1000 MG TABS (METFORMIN HCL)  Take one tablet by mouth twice daily  #60 Each x 11   Entered and Authorized by:   Sinda Du MD   Signed by:   Sinda Du MD on 11/22/2010   Method used:   Print then Give to Patient   RxID:   [id number] INSULIN SYRINGE 29G X 1/2" 0.5 ML MISC (INSULIN SYRINGE-NEEDLE U-100) Use it to draw insulin as prescribed.  #100 x prn   Entered and Authorized by:   Sinda Du MD   Signed by:   Sinda Du MD on 11/22/2010   Method used:   Print then Give to Patient   RxID:   [id number] LANTUS 100 UNIT/ML SOLN (INSULIN GLARGINE) Inject 38 units subcutaneously at night  #1 vial x 5   Entered and Authorized by:   Sinda Du MD   Signed by:   Sinda Du MD on 11/22/2010   Method used:   Print then Give to Patient   RxID:   [id number] PRAVASTATIN SODIUM 40 MG TABS (PRAVASTATIN SODIUM) Take one tablet by mouth once daily  #30 Each x 8   Entered and Authorized by:   Sinda Du MD   Signed by:   Sinda Du MD on 11/22/2010   Method used:   Print then Give to Patient   RxID:   [id number] GLIPIZIDE 10 MG TABS (GLIPIZIDE) Take 1 tablet by mouth twice a day  #60 Each x 6   Entered and Authorized by:   Sinda Du MD   Signed by:   Sinda Du MD on 11/22/2010   Method used:   Print then Give to Patient   RxID:   [id number]    Orders Added: 1)  T-Hgb A1C (in-house) [32202RK] 2)  Diabetic Clinic Referral [Diabetic] 3)  Est. 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 OU Visual acuity OD: 20/30      Visual acuity OS :20/25         (04/21/2009)   Diabetic eye exam action/deferral: Ophthalmology referral  (06/20/2010)   Eye exam due: 10/2009    Foot exam: yes  (10/19/2010)   High risk foot: Not documented   Foot care education: Not documented    Urine microalbumin/creatinine ratio: 14.2  (06/20/2010)   Urine microalbumin action/deferral: Ordered  Lipids   Total Cholesterol: 167  (09/27/2009)   LDL: * mg/dL  (16/09/9603)   LDL Direct: Not documented   HDL: 34  (09/27/2009)   Triglycerides: 418  (09/27/2009)   Lipid panel due: 05/16/2008    SGOT (AST): 20  (09/09/2010)   SGPT (ALT): 34  (09/09/2010)   Alkaline phosphatase: 80  (09/09/2010)   Total bilirubin: 0.3  (09/09/2010)  Hypertension   Last Blood Pressure: 126 / 79  (11/22/2010)   Serum creatinine: 1.05  (09/09/2010)   Serum potassium 4.5  (09/09/2010)  Self-Management Support :   Personal Goals (by the next clinic visit) :     Personal A1C goal: 7  (06/20/2010)     Personal blood pressure goal: 130/80  (06/20/2010)     Personal LDL goal: 100  (06/20/2010)    Patient will work on the following items until the next clinic visit to reach self-care goals:     Medications and monitoring: take my medicines every day, check my blood sugar, examine my feet every day  (11/22/2010)     Eating: drink diet soda or water instead of juice or soda, eat more vegetables, use fresh or frozen vegetables, eat foods that are low in salt, eat baked foods instead of fried foods, eat fruit for snacks and desserts, limit or avoid alcohol  (11/22/2010)     Activity: take a 30 minute walk every day  (06/20/2010)    Diabetes self-management support: Resources for patients handout  (11/22/2010)   Last diabetes self-management training by diabetes educator: 04/30/2009   Referred for diabetes self-mgmt training.   Last medical nutrition therapy: 07/23/2007    Hypertension  self-management support: Resources for patients handout  (11/22/2010)    Lipid self-management support: Resources for patients handout  (11/22/2010)     Self-management comments: DOES A LOT OF WALKING AT [employer] handout printed.    Last LDL:                                                 * mg/dL (54/08/8118 1:47:Harold PM)        Diabetic Foot Exam    10-g (5.07) Semmes-Weinstein Monofilament Test Performed by: Theotis Barrio NT II          Right Foot          Left Foot Visual Inspection     normal           normal   Laboratory Results   Blood Tests   Date/Time Received: November 22, 2010 12:45 PM Date/Time Reported: Alric Quan  November 22, 2010  12:45 PM  HGBA1C: 8.4%   (Normal Range: Non-Diabetic - 3-6%   Control Diabetic - 6-8%)

## 2011-01-26 NOTE — Progress Notes (Signed)
Summary: metformin questions and f/u/ hla  Phone Note Call from Patient   Summary of Call: a note had been added to chart previously and left open for md's comment. that note is not present in the chart at present. pt called asking about his metformin, after speaking w/ pharm, there are refills for the 1000mg  but not 500mg , in the last visit notes it is not stated by the physician that pt is taking 2500mg  metformin daily. it was advised to pt that since f/u appt should have been in sept that it would be good to come in for eval of med regimen and clearly documented so as not to be confusing, pt was agreeable at that time.    Initial call taken by: Marin Roberts RN,  November 11, 2010 12:04 PM  Follow-up for Phone Call        Provider Notified Follow-up by: Deatra Robinson MD,  November 11, 2010 3:09 PM

## 2011-01-26 NOTE — Progress Notes (Signed)
Summary: lantus arrived  Phone Note Call from Patient   Caller: Patient Summary of Call: pt. notified Lantus has arrived through pt. assistance Initial call taken by: Wendall Mola CMA [facility]),  November 03, 2010 11:42 AM     Appended Document: lantus arrived pt. picked up meds

## 2011-01-26 NOTE — Letter (Signed)
Summary: BLOOD GLUCOSE  BLOOD GLUCOSE   Imported By: Margie Billet 12/12/2010 14:59:42  _____________________________________________________________________  External Attachment:    Type:   Image     Comment:   External Document

## 2011-01-26 NOTE — Progress Notes (Signed)
Summary: Lantus pt. assistance approved  Phone Note Outgoing Call   Call placed by: Annice Pih Summary of Call: Notified pt. that his Lantus has been approved through [employer] and they are shipping a three month supply.  When pt. finishes the first two months he is to call the company for refills.  That number is [phone] Initial call taken by: Wendall Mola CMA [facility]),  November 01, 2010 4:18 PM

## 2011-01-26 NOTE — Progress Notes (Signed)
Summary: time to reenroll for drug program  Phone Note Outgoing Call   Call placed by: Paulo Fruit  BS,CPht II,MPH,  September 06, 2010 3:00 PM Call placed to: Patient Summary of Call: spoke to patient to let him know that he needs to reenroll for the patient assistance program to obtain his lantus.  Partient is coming in on Friday morning to sign the application. Initial call taken by: Paulo Fruit  BS,CPht II,MPH,  September 06, 2010 3:01 PM

## 2011-01-26 NOTE — Progress Notes (Signed)
Summary: insulin   Phone Note Call from Patient   Caller: Patient Summary of Call: Pt. called to check on the status of his insulin, that was ordered through a pt. assistance program.  Will try to call [employer] and return pts. call tomorrow. Found application and showed it was faxed by Byrd Hesselbach 09/09/10. Initial call taken by: Wendall Mola CMA [facility]),  October 17, 2010 5:05 PM  Follow-up for Phone Call        Spoke with a representative from [employer] and was told that the application was never received.  Was told they received 3,000 applications in September and that many of those were lost when downloaded to the server.  They could not confirm that the application that was refaxed this morning was received because they said they needed 48 hrs. before it could be confirmed. It would be 3-5 days from receipt of application that insulin would be shipped.  Explained all this to the pt. and told him I would check with the company Thursday 10/20/10 to make sure application was received. Follow-up by: Wendall Mola CMA [facility]),  October 18, 2010 12:16 PM

## 2011-01-26 NOTE — Progress Notes (Signed)
Summary: Refill/gh  Phone Note Refill Request Message from:  Patient on October 28, 2010 11:30 AM  Refills Requested: Medication #1:  METFORMIN HCL 500 MG TABS Take one tablet by mouth daily in the morning  Method Requested: Electronic Initial call taken by: Angelina Ok RN,  October 28, 2010 11:30 AM  Follow-up for Phone Call        Patient had Rx refilled in June with 11 Refills. Follow-up by: Deatra Robinson MD,  October 28, 2010 12:10 PM     Appended Document: Refill/gh Patient is sch for Dr. Denton Meek on 11/29/2010 at 8:30am.  Patient was sch on 11/11/2010 and cancelled.  He has left a message with Jamison Neighbor about his Diabetes Medicine.  Jamison Neighbor is aware and will call him back.

## 2011-01-26 NOTE — Progress Notes (Signed)
Summary: Pt concerned about meds.   Phone Note Call from Patient   Summary of Call: Pt says he has been without his Atripla and dapsone due to mix up with ADAP and he is stating to feel sick.  He states his form keeps coming back pending and he has not been able to get it filled.  Pt says he has been bringing the requested information and does not understand why it has not been approved. I spoke to Byrd Hesselbach who also says his application has returned pending several times due to the need for more financial information   I asked the patient to speak with Byrd Hesselbach to see exactly what he needs to bring so he can bring them to the office. Tomasita Morrow RN  May 24, 2010 3:48 PM    Per Dr [name] , please give pt samples of Atripla and Dapsone for one month until this issue has been resolved Tomasita Morrow RN  May 24, 2010 3:47 PM     Prescriptions: ATRIPLA 600-200-300 MG  TABS (EFAVIRENZ-EMTRICITAB-TENOFOVIR) once daily  #30 x 0   Entered by:   Paulo Fruit  BS,CPht II,MPH   Authorized by:   Johny Sax MD   Signed by:   Paulo Fruit  BS,CPht II,MPH on 05/24/2010   Method used:   Samples Given   RxID:   [id number] DAPSONE 100 MG TABS (DAPSONE) Take 1 tablet by mouth once a day  #30 x 0   Entered by:   Paulo Fruit  BS,CPht II,MPH   Authorized by:   Johny Sax MD   Signed by:   Paulo Fruit  BS,CPht II,MPH on 05/24/2010   Method used:   Samples Given   RxID:   [id number]  Sample Given, Lot #: Dapsone 100mg  [id number] Expiration Date:01 2016 Patient has been instructed regarding the correct time, dose and frequency of taking this med, including desired effects and most common side effects.  Sample Given, Lot #:[id number] Expiration Date:10 2013 Patient has been instructed regarding the correct time, dose and frequency of taking this med, including desired effects and most common side effects. I will recheck to see what exactly patient keeps missing. Paulo Fruit  BS,CPht II,MPH   May 24, 2010 5:02 PM

## 2011-01-26 NOTE — Progress Notes (Signed)
Summary: metformin refill/dmr  Phone Note Call from Patient Call back at Home Phone [phone]   Caller: Patient Summary of Call: Patient requests return call Jamison Neighbor RD,CDE  November 16, 2010 11:58 AM returned patient call- he is wondering why he stiable to fill teh 500 mg metformin qwhen he has made an ppoitnment as requested for 11/29/10. I told him I see both his appointment scheduled and a new reill request on his chart and would check wiht the refill nurse to see where they were int he process of refilling it.  Patient can be reached at [facility] - I am willing to notify him of outcome as needed. Initial call taken by: Jamison Neighbor RD,CDE,  November 16, 2010 12:04 PM  Follow-up for Phone Call        Pt. was called and appt. has been scheduled w/Dr. Cathey Endow Tuesday 11/29 @ 1130PM (Dr. Denton Meek has no openings); he agreed to keep this appt. Follow-up by: Chinita Pester RN,  November 16, 2010 12:33 PM

## 2011-02-13 ENCOUNTER — Encounter (INDEPENDENT_AMBULATORY_CARE_PROVIDER_SITE_OTHER): Payer: Self-pay | Admitting: *Deleted

## 2011-02-21 NOTE — Miscellaneous (Signed)
  Clinical Lists Changes  Observations: Added new observation of PCTFPL: 192.06  (02/13/2011 14:04) Added new observation of HOUSEINCOME: 04540  (02/13/2011 14:04)      Net income 20,800

## 2011-02-27 ENCOUNTER — Telehealth (INDEPENDENT_AMBULATORY_CARE_PROVIDER_SITE_OTHER): Payer: Self-pay | Admitting: *Deleted

## 2011-03-07 LAB — GLUCOSE, CAPILLARY: Glucose-Capillary: 246 mg/dL — ABNORMAL HIGH (ref 70–99)

## 2011-03-07 NOTE — Progress Notes (Signed)
Summary: Lantus PAP arrived.  Pt. notified.  Phone Note Outgoing Call   Call placed by: Jennet Maduro RN,  February 27, 2011 10:42 AM Call placed to: Patient Action Taken: Phone Call Completed, Assistance medications ready for pick up Summary of Call: Lantus 100/mL PAP arrived. (4 - 10mL bottles).  Lot # [id number].  exp date - 09/2012.  Pt. called and notified. Jennet Maduro RN  February 27, 2011 10:44 AM

## 2011-03-09 ENCOUNTER — Encounter (INDEPENDENT_AMBULATORY_CARE_PROVIDER_SITE_OTHER): Payer: Self-pay | Admitting: *Deleted

## 2011-03-09 LAB — T-HELPER CELL (CD4) - (RCID CLINIC ONLY): CD4 % Helper T Cell: 26 % — ABNORMAL LOW (ref 33–55)

## 2011-03-12 LAB — GLUCOSE, CAPILLARY: Glucose-Capillary: 332 mg/dL — ABNORMAL HIGH (ref 70–99)

## 2011-03-14 ENCOUNTER — Encounter: Payer: Self-pay | Admitting: Ophthalmology

## 2011-03-14 NOTE — Miscellaneous (Signed)
Summary: ADAP APPROVED TIL 09/24/11  Clinical Lists Changes  Observations: Added new observation of PCTFPL: 208.56  (03/09/2011 18:06) Added new observation of FINASSESSDT: 02/27/2011  (03/09/2011 18:06) Added new observation of YEARLYEXPEN: 0  (03/09/2011 18:06) Added new observation of HOUSEINCOME: 81191  (03/09/2011 18:06) Added new observation of AIDSDAP: Yes 2012  (03/09/2011 18:06)

## 2011-03-30 LAB — T-HELPER CELL (CD4) - (RCID CLINIC ONLY): CD4 % Helper T Cell: 24 % — ABNORMAL LOW (ref 33–55)

## 2011-03-31 LAB — GLUCOSE, CAPILLARY: Glucose-Capillary: 290 mg/dL — ABNORMAL HIGH (ref 70–99)

## 2011-04-01 LAB — GLUCOSE, CAPILLARY: Glucose-Capillary: 208 mg/dL — ABNORMAL HIGH (ref 70–99)

## 2011-04-03 ENCOUNTER — Telehealth: Payer: Self-pay | Admitting: *Deleted

## 2011-04-03 ENCOUNTER — Other Ambulatory Visit: Payer: Self-pay | Admitting: Internal Medicine

## 2011-04-03 DIAGNOSIS — M3390 Dermatopolymyositis, unspecified, organ involvement unspecified: Secondary | ICD-10-CM

## 2011-04-03 LAB — T-HELPER CELL (CD4) - (RCID CLINIC ONLY): CD4 % Helper T Cell: 20 % — ABNORMAL LOW (ref 33–55)

## 2011-04-03 NOTE — Telephone Encounter (Signed)
Pt calls to say he was taken off glipizide at last visit (dec)

## 2011-04-03 NOTE — Telephone Encounter (Signed)
As i was stating in prior note, pt wanted a refill, he is informed he must be seen and is transferred to scheduling for an appt

## 2011-04-04 LAB — GLUCOSE, CAPILLARY: Glucose-Capillary: 345 mg/dL — ABNORMAL HIGH (ref 70–99)

## 2011-04-09 ENCOUNTER — Other Ambulatory Visit: Payer: Self-pay | Admitting: Infectious Diseases

## 2011-04-09 DIAGNOSIS — B20 Human immunodeficiency virus [HIV] disease: Secondary | ICD-10-CM

## 2011-04-10 ENCOUNTER — Ambulatory Visit (INDEPENDENT_AMBULATORY_CARE_PROVIDER_SITE_OTHER): Payer: Self-pay | Admitting: Internal Medicine

## 2011-04-10 VITALS — BP 138/83 | HR 95 | Temp 97.0°F | Ht 73.0 in | Wt 244.9 lb

## 2011-04-10 DIAGNOSIS — I10 Essential (primary) hypertension: Secondary | ICD-10-CM

## 2011-04-10 DIAGNOSIS — E119 Type 2 diabetes mellitus without complications: Secondary | ICD-10-CM

## 2011-04-10 LAB — GLUCOSE, CAPILLARY: Glucose-Capillary: 273 mg/dL — ABNORMAL HIGH (ref 70–99)

## 2011-04-10 MED ORDER — INSULIN GLARGINE 100 UNIT/ML ~~LOC~~ SOLN: 45.0000 [IU] | Freq: Every day | SUBCUTANEOUS | Status: DC

## 2011-04-10 NOTE — Progress Notes (Signed)
  Subjective:    Patient ID: Harold Crosby, male    DOB: [birth year], 40 y.o.   MRN: [id number]  HPI Patient is a 40 year old man with past medical history most significant for HIV and type 2 diabetes. Patient is here today for regular office visit.  He sees that as CBGs have been running in 200s ever since his glipizide was taken off. Patient expresses an interest in restarting glipizide. Patient had an eye exam done last year.  Patient is compliant with his HIV medications.  No other complaints  Review of Systems  Constitutional: Negative for fever, activity change and appetite change.  HENT: Negative for sore throat.   Respiratory: Negative for cough and shortness of breath.   Cardiovascular: Negative for chest pain and leg swelling.  Gastrointestinal: Negative for nausea, abdominal pain, diarrhea, constipation and abdominal distention.  Genitourinary: Negative for frequency, hematuria and difficulty urinating.  Neurological: Negative for dizziness and headaches.  Psychiatric/Behavioral: Negative for suicidal ideas and behavioral problems.       Objective:   Physical Exam  Constitutional: He is oriented to person, place, and time. He appears well-developed and well-nourished.  HENT:  Head: Normocephalic and atraumatic.  Eyes: Conjunctivae and EOM are normal. Pupils are equal, round, and reactive to light. No scleral icterus.  Neck: Normal range of motion. Neck supple. No JVD present. No thyromegaly present.  Cardiovascular: Normal rate, regular rhythm, normal heart sounds and intact distal pulses.  Exam reveals no gallop and no friction rub.   No murmur heard. Pulmonary/Chest: Effort normal and breath sounds normal. No respiratory distress. He has no wheezes. He has no rales.  Abdominal: Soft. Bowel sounds are normal. He exhibits no distension and no mass. There is no tenderness. There is no rebound and no guarding.  Musculoskeletal: Normal range of motion. He exhibits no edema and  no tenderness.  Lymphadenopathy:    He has no cervical adenopathy.  Neurological: He is alert and oriented to person, place, and time.  Psychiatric: He has a normal mood and affect. His behavior is normal.          Assessment & Plan:

## 2011-04-10 NOTE — Patient Instructions (Signed)
What is this medicine? INSULIN GLARGINE (IN su lin GLAR geen) is a human-made form of insulin. This drug lowers the amount of sugar in your blood. It is a long-acting insulin that is usually given once a day. This medicine may be used for other purposes; ask your health care provider or pharmacist if you have questions.   What should I tell my health care provider before I take this medicine? They need to know if you have any of these conditions: -episodes of hypoglycemia -kidney disease -liver disease -an unusual or allergic reaction to insulin, metacresol, other medicines, foods, dyes, or preservatives -pregnant or trying to get pregnant -breast-feeding   How should I use this medicine? This medicine is for injection under the skin. Use exactly as directed. It is important to follow the directions given to you by your health care professional or doctor. You will be taught how to use this medicine and how to adjust doses for activities and illness. You may take this medicine at any time of the day but you must take it at the same time everyday. Do not use more insulin than prescribed. Do not use more or less often than prescribed.   Always check the appearance of your insulin before using it. This medicine should be clear and colorless like water. Do not use it if it is cloudy, thickened, colored, or has solid particles in it.   Do not mix this medicine with any other insulin or diluent.   It is important that you put your used needles and syringes in a special sharps container. Do not put them in a trash can. If you do not have a sharps container, call your pharmacist or healthcare provider to get one.   Talk to your pediatrician regarding the use of this medicine in children. Special care may be needed.   Overdosage: If you think you have taken too much of this medicine contact a poison control center or emergency room at once. NOTE: This medicine is only for you. Do not share this  medicine with others.   What if I miss a dose? It is important not to miss a dose. Your health care professional or doctor should discuss a plan for missed doses with you. If you do miss a dose, follow their plan. Do not take double doses.   What may interact with this medicine? -other medicines for diabetes   Many medications may cause an increase or decrease in blood sugar, these include: -alcohol containing beverages -aspirin and aspirin-like drugs -chloramphenicol -chromium -diuretics -male hormones, like estrogens or progestins and birth control pills -heart medicines -isoniazid -male hormones or anabolic steroids -medicines for weight loss -medicines for allergies, asthma, cold, or cough -medicines for mental problems -medicines called MAO Inhibitors like Nardil, Parnate, Marplan, Eldepryl -niacin -NSAIDs, medicines for pain and inflammation, like ibuprofen or naproxen -pentamidine -phenytoin -probenecid -quinolone antibiotics like ciprofloxacin, levofloxacin, ofloxacin -some herbal dietary supplements -steroid medicines like prednisone or cortisone -thyroid medicine   Some medications can hide the warning symptoms of low blood sugar. You may need to monitor your blood sugar more closely if you are taking one of these medications. These include: -beta-blockers such as atenolol, metoprolol, propranolol -clonidine -guanethidine -reserpine   This list may not describe all possible interactions. Give your health care provider a list of all the medicines, herbs, non-prescription drugs, or dietary supplements you use. Also tell them if you smoke, drink alcohol, or use illegal drugs. Some items may interact with your  medicine.   What should I watch for while using this medicine? Visit your health care professional or doctor for regular checks on your progress. To control your diabetes you must use this medicine regularly and follow a diet and exercise schedule. Checking and  recording your blood sugar and urine ketone levels regularly is important. Use a blood sugar measuring device before you treat high or low blood sugar. Always carry a quick-source of sugar with you in case you have symptoms of low blood sugar. Examples include hard sugar candy or glucose tablets. Make sure family members know that you can choke if you eat or drink when you develop serious symptoms of low blood sugar, such as seizures or unconsciousness. They must get medical help at once. Make sure that you have the right kind of syringe for the type of insulin you use. Try not to change the brand and type of insulin or syringe unless your health care professional or doctor tells you to. Switching insulin brand or type can cause dangerously high or low blood sugar. Always keep an extra supply of insulin, syringes, and needles on hand. Use a syringe one time only. Throw away syringe and needle in a closed container to prevent accidental needle sticks. Insulin pens and cartridges should never be shared. Sharing may result in passing of viruses like hepatitis or HIV. Wear a medical identification bracelet or chain to say you have diabetes, and carry a card that lists all your medications. Many nonprescription cough and cold products contain sugar or alcohol. These can affect diabetes control or can alter the results of tests used to monitor blood sugar. Avoid alcohol. Avoid products that contain alcohol or sugar.   What side effects may I notice from receiving this medicine? Side effects that you should report to your health care professional or doctor as soon as possible:   Symptoms of low blood sugar: -You may feel nervous, confused, dizzy, hungry, weak, sweaty, shaky, cold, and irritable. You may also experience headache, blurred vision, rapid heartbeat and loss of consciousness.   Symptoms of high blood sugar: -You may experience dizziness, dry mouth, dry skin, fruity breath, loss of appetite, nausea,  stomach ache, unusual thirst, frequent urination   Insulin also can cause rare but serious allergic reactions in some patients, including: -bad skin rash and itching -breathing problems   Side effects that usually do not require medical attention (report to your health care professional or doctor if they continue or are bothersome): -increase or decrease in fatty tissue under the skin, through overuse of a particular injection -itching, burning, swelling, or rash at the injection site   This list may not describe all possible side effects. Call your doctor for medical advice about side effects. You may report side effects to FDA at 1-800-FDA-1088.   Where should I keep my medicine? Keep out of the reach of children.   Store unopened vials in a refrigerator between 2 and 8 degrees C (36 and 46 degrees F). Do not freeze or use if the insulin has been frozen. Opened vials (vials currently in use) may be stored in the refrigerator or at room temperature, at approximately 25 degrees C (77 degrees F) or cooler. Keeping your insulin at room temperature decreases the amount of pain during injection. Once opened, your insulin can be used for 28 days. After 28 days, the vial should be thrown away.   Store unopened pen-injector cartridges in a refrigerator between 2 and 8 degrees C (36 and  46 degrees F.) Do not freeze or use if the insulin has been frozen. Insulin cartridges inserted into the [employer] system should be kept at room temperature, approximately 25 degrees C (77 degrees F) or cooler. Do not store in the refrigerator. Once inserted into the [employer] system, the insulin can be used for 28 days. After 28 days, the cartridge should be thrown away.   Protect from light and excessive heat. Throw away any unused medicine after the expiration date or after the specified time for room temperature storage has passed.   NOTE:This sheet is a summary. It may not cover all possible information. If you have  questions about this medicine, talk to your doctor, pharmacist, or health care provider.      2011, Elsevier/Gold Standard.   Go up on your lantus by 2 points every week if your morning CBG is above 130 until it is less then 130.  If your morning CBG is consistently below 70, cut down your insulin by 5 points.

## 2011-04-10 NOTE — Assessment & Plan Note (Addendum)
Given the patient's diabetes is very poorly controlled at this time I would go up on Lantus by 5 point. The patient was educated about going up on the Lantus by 2units if his morning CBGs are more than 130 consistently. Patient was also given a printout for identifying the signs and symptoms of hypoglycemia. Have asked patient to come back in 2 months at which point we would repeat HbA1c and do regular blood work. Patient was agreeable to this plan.  Patient was also informed that given he is currently on insulin there is very little chance  of glipizide being effective. Patient was agreeable and seemed satisfied with the above mentioned.

## 2011-04-10 NOTE — Assessment & Plan Note (Signed)
Blood pressure well controlled at this time. I would not change any medication.

## 2011-04-27 ENCOUNTER — Telehealth: Payer: Self-pay | Admitting: *Deleted

## 2011-04-27 NOTE — Telephone Encounter (Signed)
States he is down to 1 wk of insulin. I called [employer]. They are faxing a form here that needs to be completed & faxed back. Rec'd, completed & faxed back. LM for pt that we are working on it & the med should be shipped

## 2011-05-03 ENCOUNTER — Other Ambulatory Visit: Payer: Self-pay | Admitting: *Deleted

## 2011-05-03 DIAGNOSIS — E119 Type 2 diabetes mellitus without complications: Secondary | ICD-10-CM

## 2011-05-03 MED ORDER — INSULIN GLARGINE 100 UNIT/ML ~~LOC~~ SOLN: 45.0000 [IU] | Freq: Every day | SUBCUTANEOUS | Status: DC

## 2011-06-11 ENCOUNTER — Other Ambulatory Visit: Payer: Self-pay | Admitting: Infectious Diseases

## 2011-06-12 ENCOUNTER — Other Ambulatory Visit: Payer: Self-pay | Admitting: *Deleted

## 2011-06-12 DIAGNOSIS — B20 Human immunodeficiency virus [HIV] disease: Secondary | ICD-10-CM

## 2011-06-12 MED ORDER — EFAVIRENZ-EMTRICITAB-TENOFOVIR 600-200-300 MG PO TABS: 1.0000 | ORAL_TABLET | Freq: Every day | ORAL | Status: DC

## 2011-06-30 ENCOUNTER — Other Ambulatory Visit: Payer: Self-pay | Admitting: Internal Medicine

## 2011-06-30 DIAGNOSIS — E119 Type 2 diabetes mellitus without complications: Secondary | ICD-10-CM

## 2011-07-01 MED ORDER — LISINOPRIL-HYDROCHLOROTHIAZIDE 20-25 MG PO TABS: 1.0000 | ORAL_TABLET | Freq: Every day | ORAL | Status: DC

## 2011-07-07 ENCOUNTER — Ambulatory Visit: Payer: Self-pay

## 2011-07-24 ENCOUNTER — Other Ambulatory Visit: Payer: Self-pay

## 2011-07-27 ENCOUNTER — Other Ambulatory Visit: Payer: Self-pay | Admitting: *Deleted

## 2011-07-27 NOTE — Telephone Encounter (Signed)
Patient called to request a reorder of Lantus through [facility] Patient Assistance.  Reorder filled out and faxed to [phone] Wendall Mola CMA

## 2011-07-31 ENCOUNTER — Other Ambulatory Visit (INDEPENDENT_AMBULATORY_CARE_PROVIDER_SITE_OTHER): Payer: Self-pay

## 2011-07-31 DIAGNOSIS — Z113 Encounter for screening for infections with a predominantly sexual mode of transmission: Secondary | ICD-10-CM

## 2011-07-31 DIAGNOSIS — B20 Human immunodeficiency virus [HIV] disease: Secondary | ICD-10-CM

## 2011-07-31 DIAGNOSIS — Z79899 Other long term (current) drug therapy: Secondary | ICD-10-CM

## 2011-08-01 LAB — CBC WITH DIFFERENTIAL/PLATELET
Eosinophils Relative: 2 % (ref 0–5)
HCT: 43.5 % (ref 39.0–52.0)
Lymphocytes Relative: 18 % (ref 12–46)
Lymphs Abs: 1.1 10*3/uL (ref 0.7–4.0)
MCV: 100 fL (ref 78.0–100.0)
Monocytes Absolute: 0.5 10*3/uL (ref 0.1–1.0)
Monocytes Relative: 8 % (ref 3–12)
RBC: 4.35 MIL/uL (ref 4.22–5.81)
WBC: 6.1 10*3/uL (ref 4.0–10.5)

## 2011-08-01 LAB — T-HELPER CELL (CD4) - (RCID CLINIC ONLY): CD4 T Cell Abs: 280 uL — ABNORMAL LOW (ref 400–2700)

## 2011-08-01 LAB — LIPID PANEL
Cholesterol: 190 mg/dL (ref 0–200)
HDL: 36 mg/dL — ABNORMAL LOW
Total CHOL/HDL Ratio: 5.3 ratio
Triglycerides: 530 mg/dL — ABNORMAL HIGH

## 2011-08-01 LAB — COMPLETE METABOLIC PANEL WITH GFR
AST: 22 U/L (ref 0–37)
Albumin: 4 g/dL (ref 3.5–5.2)
Alkaline Phosphatase: 72 U/L (ref 39–117)
BUN: 15 mg/dL (ref 6–23)
Creat: 0.94 mg/dL (ref 0.50–1.35)
Potassium: 4.2 mEq/L (ref 3.5–5.3)

## 2011-08-02 LAB — HIV-1 RNA QUANT-NO REFLEX-BLD: HIV 1 RNA Quant: <20 copies/mL (ref <20)

## 2011-08-07 ENCOUNTER — Other Ambulatory Visit: Payer: Self-pay | Admitting: *Deleted

## 2011-08-07 ENCOUNTER — Encounter: Payer: Self-pay | Admitting: Infectious Diseases

## 2011-08-07 ENCOUNTER — Ambulatory Visit (INDEPENDENT_AMBULATORY_CARE_PROVIDER_SITE_OTHER): Payer: Self-pay | Admitting: Infectious Diseases

## 2011-08-07 DIAGNOSIS — Z113 Encounter for screening for infections with a predominantly sexual mode of transmission: Secondary | ICD-10-CM

## 2011-08-07 DIAGNOSIS — E785 Hyperlipidemia, unspecified: Secondary | ICD-10-CM

## 2011-08-07 DIAGNOSIS — B20 Human immunodeficiency virus [HIV] disease: Secondary | ICD-10-CM

## 2011-08-07 DIAGNOSIS — G4700 Insomnia, unspecified: Secondary | ICD-10-CM

## 2011-08-07 DIAGNOSIS — E119 Type 2 diabetes mellitus without complications: Secondary | ICD-10-CM

## 2011-08-07 DIAGNOSIS — Z79899 Other long term (current) drug therapy: Secondary | ICD-10-CM

## 2011-08-07 MED ORDER — TEMAZEPAM 15 MG PO CAPS: 15.0000 mg | ORAL_CAPSULE | Freq: Every evening | ORAL | Status: AC | PRN

## 2011-08-07 NOTE — Progress Notes (Signed)
  Subjective:    Patient ID: Harold Crosby, male    DOB: 06/20/1971, 40 y.o.   MRN: [id number]  HPI 40 yo M with HIV+ and DM2. He has been followed at IM clinic as well as at [facility] His most recent labs have shown a Triglyceride of 530, Glc 292 and CD4 280, VL <20 (07-31-11).  He has no trouble with his atripla, FSGs have been "fair, they keep uping my insulin".   Review of Systems  Constitutional: Negative for unexpected weight change.  Respiratory: Negative for chest tightness and shortness of breath.   Gastrointestinal: Negative for diarrhea and constipation.  Genitourinary: Negative for dysuria.  Musculoskeletal: Positive for arthralgias.  Neurological: Negative for headaches.  no sores on his feet or numbness.      Objective:   Physical Exam  Constitutional: He appears well-developed and well-nourished.  Eyes: EOM are normal. Pupils are equal, round, and reactive to light.  Neck: Neck supple.  Cardiovascular: Normal rate, regular rhythm and normal heart sounds.   Pulmonary/Chest: Effort normal and breath sounds normal.  Abdominal: Soft. Bowel sounds are normal. He exhibits no distension. There is no tenderness. There is no rebound.  Lymphadenopathy:    He has no cervical adenopathy.  Neurological:       Light touch grossly nl.   Skin:       His feet are in good condition. There is a small crack in his skin between his L 4th and 5th toes.           Assessment & Plan:

## 2011-08-07 NOTE — Assessment & Plan Note (Signed)
Will try low dose of restoril.

## 2011-08-07 NOTE — Assessment & Plan Note (Signed)
My great appreciation to the IM clinic. His FSG continue to be difficult to control. Will assist in his insulin refill today

## 2011-08-07 NOTE — Assessment & Plan Note (Signed)
He appears to be doing well. Taking meds appropriately. Using condoms. He has a partner of 11 years who remains uninfected. Will see him back in 5-6 months. Will ask him to come back in 2-3 weeks for flu shot. We will refer him to dental in the ID clinic for his badly carried teeth.

## 2011-08-07 NOTE — Assessment & Plan Note (Signed)
His Trig are quite elevated. may need to switch to atorvastatin of add fenofibrate. Will defer to IM

## 2011-08-29 ENCOUNTER — Other Ambulatory Visit: Payer: Self-pay | Admitting: *Deleted

## 2011-08-29 NOTE — Telephone Encounter (Signed)
Notified patient his insulin is ready for pick up through patient assistance. Wendall Mola CMA

## 2011-09-04 ENCOUNTER — Ambulatory Visit (INDEPENDENT_AMBULATORY_CARE_PROVIDER_SITE_OTHER): Payer: Self-pay | Admitting: *Deleted

## 2011-09-04 DIAGNOSIS — B20 Human immunodeficiency virus [HIV] disease: Secondary | ICD-10-CM

## 2011-09-04 DIAGNOSIS — Z23 Encounter for immunization: Secondary | ICD-10-CM

## 2011-09-27 LAB — GLUCOSE, CAPILLARY

## 2011-10-05 ENCOUNTER — Encounter: Payer: Self-pay | Admitting: Dietician

## 2011-10-06 ENCOUNTER — Other Ambulatory Visit: Payer: Self-pay | Admitting: *Deleted

## 2011-10-06 DIAGNOSIS — B20 Human immunodeficiency virus [HIV] disease: Secondary | ICD-10-CM

## 2011-10-06 MED ORDER — EFAVIRENZ-EMTRICITAB-TENOFOVIR 600-200-300 MG PO TABS: 1.0000 | ORAL_TABLET | Freq: Every day | ORAL | Status: DC

## 2011-10-10 LAB — T-HELPER CELL (CD4) - (RCID CLINIC ONLY)
CD4 % Helper T Cell: 21 — ABNORMAL LOW
CD4 T Cell Abs: 60 — ABNORMAL LOW

## 2012-02-13 ENCOUNTER — Other Ambulatory Visit: Payer: Self-pay | Admitting: Internal Medicine

## 2012-02-13 ENCOUNTER — Other Ambulatory Visit: Payer: Self-pay | Admitting: *Deleted

## 2012-02-13 DIAGNOSIS — M3390 Dermatopolymyositis, unspecified, organ involvement unspecified: Secondary | ICD-10-CM

## 2012-02-14 MED ORDER — GLIPIZIDE 10 MG PO TABS: 10.0000 mg | ORAL_TABLET | Freq: Two times a day (BID) | ORAL | Status: DC

## 2012-02-16 ENCOUNTER — Encounter: Payer: Self-pay | Admitting: Internal Medicine

## 2012-02-21 ENCOUNTER — Other Ambulatory Visit: Payer: Self-pay | Admitting: Internal Medicine

## 2012-02-21 NOTE — Telephone Encounter (Signed)
Will refill for 1 month.  Must have appointment.

## 2012-02-22 ENCOUNTER — Encounter: Payer: Self-pay | Admitting: Internal Medicine

## 2012-02-23 ENCOUNTER — Ambulatory Visit: Payer: Self-pay

## 2012-02-27 ENCOUNTER — Encounter: Payer: Self-pay | Admitting: Internal Medicine

## 2012-03-01 ENCOUNTER — Encounter: Payer: Self-pay | Admitting: Internal Medicine

## 2012-03-01 ENCOUNTER — Ambulatory Visit: Payer: Self-pay

## 2012-03-08 ENCOUNTER — Ambulatory Visit (HOSPITAL_COMMUNITY)
Admission: RE | Admit: 2012-03-08 | Discharge: 2012-03-08 | Disposition: A | Discharge status: Home / Self Care | Payer: Self-pay | Source: Ambulatory Visit | Attending: Internal Medicine | Admitting: Internal Medicine

## 2012-03-08 ENCOUNTER — Ambulatory Visit (INDEPENDENT_AMBULATORY_CARE_PROVIDER_SITE_OTHER): Payer: Self-pay | Admitting: Internal Medicine

## 2012-03-08 ENCOUNTER — Encounter: Payer: Self-pay | Admitting: Internal Medicine

## 2012-03-08 VITALS — BP 126/77 | HR 83 | Temp 96.3°F | Ht 73.0 in | Wt 235.1 lb

## 2012-03-08 DIAGNOSIS — Z79899 Other long term (current) drug therapy: Secondary | ICD-10-CM

## 2012-03-08 DIAGNOSIS — M25519 Pain in unspecified shoulder: Secondary | ICD-10-CM | POA: Insufficient documentation

## 2012-03-08 DIAGNOSIS — E119 Type 2 diabetes mellitus without complications: Secondary | ICD-10-CM

## 2012-03-08 DIAGNOSIS — M25512 Pain in left shoulder: Secondary | ICD-10-CM | POA: Insufficient documentation

## 2012-03-08 DIAGNOSIS — I10 Essential (primary) hypertension: Secondary | ICD-10-CM

## 2012-03-08 LAB — POCT GLYCOSYLATED HEMOGLOBIN (HGB A1C): Hemoglobin A1C: 9.1

## 2012-03-08 LAB — GLUCOSE, CAPILLARY: Glucose-Capillary: 222 mg/dL — ABNORMAL HIGH (ref 70–99)

## 2012-03-08 MED ORDER — INSULIN GLARGINE 100 UNIT/ML ~~LOC~~ SOLN: 55.0000 [IU] | Freq: Every day | SUBCUTANEOUS | Status: DC

## 2012-03-08 MED ORDER — TRAMADOL HCL 50 MG PO TABS: 50.0000 mg | ORAL_TABLET | Freq: Four times a day (QID) | ORAL | Status: AC | PRN

## 2012-03-08 NOTE — Assessment & Plan Note (Signed)
Patient name have strained his left shoulder from his job, as he uses his left arm to lift heavy objects at work. Will check x-ray, and give patient tramadol for pain

## 2012-03-08 NOTE — Assessment & Plan Note (Signed)
Well controlled on current treatment, No new changes made today, Will continue to monitor.   

## 2012-03-08 NOTE — Patient Instructions (Signed)
Please increase Lantus to 55 units at bedtime Prescription for pain reliever tramadol has been sent to her pharmacy  Return in 3 months for followup

## 2012-03-08 NOTE — Progress Notes (Signed)
Patient ID: Harold Crosby, male   DOB: September 18, 1971, 41 y.o.   MRN: [id number]  HPI:   Patient is a 41 year old male with a past medical history listed below, presents to outpatient clinic for three-month diabetic followup. Reports compliance with his medications, reports left shoulder pain is ongoing for the past several weeks , patient denies any fever or chills. denies any other complaints  Review of Systems: Negative except per history of present illness  Physical Exam:  Nursing notes and vitals reviewed General:  alert, well-developed, and cooperative to examination.   Lungs:  normal respiratory effort, no accessory muscle use, normal breath sounds, no crackles, and no wheezes. Heart:  normal rate, regular rhythm, no murmurs, no gallop, and no rub.   Abdomen:  soft, non-tender, normal bowel sounds, no distention, no guarding, no rebound tenderness, no hepatomegaly, and no splenomegaly.   Extremities:  No cyanosis, clubbing, edema, left shoulder tenderness on palpation, range of motion preserved. No joint abnormality noted Neurologic:  alert & oriented X3, nonfocal exam  Meds: Medications Prior to Admission  Medication Sig Dispense Refill  . aspirin 81 MG tablet Take 81 mg by mouth daily.        Marland Kitchen efavirenz-emtrictabine-tenofovir (ATRIPLA) 600-200-300 MG per tablet Take 1 tablet by mouth at bedtime.  30 tablet  11  . glipiZIDE (GLUCOTROL) 10 MG tablet Take 1 tablet (10 mg total) by mouth 2 (two) times daily before a meal.  180 tablet  0  . INSULIN SYRINGE .5CC/29G 29G X 1/2" 0.5 ML MISC by Does not apply route. Use it to draw insulin as prescribed.        Marland Kitchen lisinopril-hydrochlorothiazide (PRINZIDE,ZESTORETIC) 20-25 MG per tablet Take 1 tablet by mouth daily.  30 tablet  11  . metFORMIN (GLUCOPHAGE) 1000 MG tablet TAKE ONE TABLET BY MOUTH TWICE DAILY  60 tablet  11  . nitroGLYCERIN (NITROSTAT) 0.4 MG SL tablet Place 0.4 mg under the tongue every 5 (five) minutes as needed. Max 3 doses for  relief of chest pain.         . pravastatin (PRAVACHOL) 40 MG tablet TAKE ONE TABLET BY MOUTH EVERY DAY  30 tablet  11  . DISCONTD: insulin glargine (LANTUS) 100 UNIT/ML injection Inject 45 Units into the skin at bedtime.  10 mL  11   No current facility-administered medications on file as of 03/08/2012.    Allergies: Sulfonamide derivatives Past Medical History  Diagnosis Date  . HIV (human immunodeficiency virus infection)     Followed by Dr. [name] in ID clinic.   . Type II diabetes mellitus   . Hyperlipidemia   . Tobacco abuse   . Hypertension   . History of thrush   . Insomnia    No past surgical history on file. Family History  Problem Relation Age of Onset  . Coronary artery disease Other   . Diabetes Other    History   Social History  . Marital Status: Single    Spouse Name: N/A    Number of Children: N/A  . Years of Education: N/A   Occupational History  . Not on file.   Social History Main Topics  . Smoking status: Current Everyday Smoker -- 1.0 packs/day for 20 years    Types: Cigarettes  . Smokeless tobacco: Never Used  . Alcohol Use: No  . Drug Use: No  . Sexually Active: Not on file     pt. declined condoms   Other Topics Concern  . Not on  file   Social History Narrative   Same girlfriend x 7 years. No previous TFX. No previous male/male intercourse.  Un clear how many heterosexual partners he has had >10, <100.[employer] approved till 03/25/11 Juanell Fairly benefits approved: patient eligible for 100% discount for outpatient lasbs and office visits. Patient available for no discount for other services. Kandice Robinsons 11/11.

## 2012-03-08 NOTE — Assessment & Plan Note (Signed)
Patient's A1c remain uncontrolled, increase Lantus to 55 units, and recheck A1c in 3 months to make further adjustments. Check microalbumin creatinine ratio, direct LDL, and foot exam today otherwise patient is up-to-date on his health maintenance

## 2012-03-26 ENCOUNTER — Other Ambulatory Visit: Payer: Self-pay | Admitting: *Deleted

## 2012-03-26 NOTE — Telephone Encounter (Signed)
Called and left V/M for patient to return my call.  His PCP is Internal Medicine and he should go through them to get his Lantus from the [facility]

## 2012-03-26 NOTE — Telephone Encounter (Signed)
States he gets it here for free. Has enough for less than a week. I told him I will give this to Randolm Idol who works with the free meds programs. She will call him if any problems getting this

## 2012-04-04 ENCOUNTER — Other Ambulatory Visit: Payer: Self-pay | Admitting: *Deleted

## 2012-04-04 DIAGNOSIS — M3390 Dermatopolymyositis, unspecified, organ involvement unspecified: Secondary | ICD-10-CM

## 2012-04-04 MED ORDER — PRAVASTATIN SODIUM 40 MG PO TABS: 40.0000 mg | ORAL_TABLET | Freq: Every day | ORAL | Status: DC

## 2012-05-06 NOTE — Progress Notes (Signed)
Addended by: Darnelle Maffucci on: 05/06/2012 04:42 PM   Modules accepted: Orders

## 2012-05-31 ENCOUNTER — Encounter (HOSPITAL_COMMUNITY): Payer: Self-pay

## 2012-05-31 ENCOUNTER — Emergency Department (INDEPENDENT_AMBULATORY_CARE_PROVIDER_SITE_OTHER)
Admission: EM | Admit: 2012-05-31 | Discharge: 2012-05-31 | Disposition: A | Discharge status: Home / Self Care | Payer: Self-pay | Source: Home / Self Care | Attending: Emergency Medicine | Admitting: Emergency Medicine

## 2012-05-31 DIAGNOSIS — M5430 Sciatica, unspecified side: Secondary | ICD-10-CM

## 2012-05-31 MED ORDER — OXYCODONE-ACETAMINOPHEN 7.5-500 MG PO TABS: 1.0000 | ORAL_TABLET | ORAL | Status: AC | PRN

## 2012-05-31 NOTE — ED Notes (Signed)
C/o pain in low back w sciatica ; pain issues for several yrs, but recently has gotten worse and is now "intolerable". Has been using oxycodone, rest, heat to back

## 2012-05-31 NOTE — Discharge Instructions (Signed)

## 2012-05-31 NOTE — ED Provider Notes (Signed)
History     CSN: [id number]  Arrival date & time 05/31/12  1727   First MD Initiated Contact with Patient 05/31/12 1741      Chief Complaint  Patient presents with  . Back Pain    (Consider location/radiation/quality/duration/timing/severity/associated sxs/prior treatment) HPI Patient is 41 year old male with history of sciatic pain who presents to urgent care with chronic back pain but worse over the past week. Patient reports heavy lifting and feels as if he has a muscle sprain. Pain is somewhat better but still patient experienced difficulty ambulating. Patient denies any fevers, chills, reports pain is radiating mostly from right lower back area to right lower foot. Patient denies other systemic symptoms, no specific focal neurologic weakness, no lower extremity swelling. He describes pain as sharp, radiating as already mentioned, intermittent in nature and 10/10 in severity when present. He denies any specific aggravating or alleviating factors. Patient denies any associated numbness or tingling in the extremities.  Past Medical History  Diagnosis Date  . HIV (human immunodeficiency virus infection)     Followed by Dr. [name] in ID clinic.   . Type II diabetes mellitus   . Hyperlipidemia   . Tobacco abuse   . Hypertension   . History of thrush   . Insomnia     History reviewed. No pertinent past surgical history.  Family History  Problem Relation Age of Onset  . Coronary artery disease Other   . Diabetes Other     History  Substance Use Topics  . Smoking status: Current Everyday Smoker -- 1.0 packs/day for 20 years    Types: Cigarettes  . Smokeless tobacco: Never Used  . Alcohol Use: No      Review of Systems  Constitutional: Denies fever, chills, diaphoresis, appetite change and fatigue.  HEENT: Denies photophobia, eye pain, redness, hearing loss, ear pain, congestion, sore throat, rhinorrhea, sneezing, mouth sores, trouble swallowing, neck pain, neck  stiffness and tinnitus.   Respiratory: Denies SOB, DOE, cough, chest tightness,  and wheezing.   Cardiovascular: Denies chest pain, palpitations and leg swelling.  Gastrointestinal: Denies nausea, vomiting, abdominal pain, diarrhea, constipation, blood in stool and abdominal distention.  Genitourinary: Denies dysuria, urgency, frequency, hematuria, flank pain and difficulty urinating.  Musculoskeletal: Denies myalgias, back pain, joint swelling, arthralgias and gait problem.  Skin: Denies pallor, rash and wound.  Neurological: Denies dizziness, seizures, syncope, weakness, light-headedness, numbness and headaches.  Hematological: Denies adenopathy. Easy bruising, personal or family bleeding history  Psychiatric/Behavioral: Denies suicidal ideation, mood changes, confusion, nervousness, sleep disturbance and agitation   Allergies  Sulfonamide derivatives  Home Medications   Current Outpatient Rx  Name Route Sig Dispense Refill  . ASPIRIN 81 MG PO TABS Oral Take 81 mg by mouth daily.      . EFAVIRENZ-EMTRICITAB-TENOFOVIR 600-200-300 MG PO TABS Oral Take 1 tablet by mouth at bedtime. 30 tablet 11  . GLIPIZIDE 10 MG PO TABS Oral Take 1 tablet (10 mg total) by mouth 2 (two) times daily before a meal. 180 tablet 0  . INSULIN GLARGINE 100 UNIT/ML [place] SOLN Subcutaneous Inject 55 Units into the skin at bedtime. 10 mL 11    Lot # [id number], exp. 11/2012, 4 vials received.  . INSULIN SYRINGE 29G X 1/2" 0.5 ML MISC Does not apply by Does not apply route. Use it to draw insulin as prescribed.      Marland Kitchen LISINOPRIL-HYDROCHLOROTHIAZIDE 20-25 MG PO TABS Oral Take 1 tablet by mouth daily. 30 tablet 11  . METFORMIN HCL 1000  MG PO TABS  TAKE ONE TABLET BY MOUTH TWICE DAILY 60 tablet 11  . NITROGLYCERIN 0.4 MG SL SUBL Sublingual Place 0.4 mg under the tongue every 5 (five) minutes as needed. Max 3 doses for relief of chest pain.       . OXYCODONE-ACETAMINOPHEN 7.5-500 MG PO TABS Oral Take 1 tablet by mouth every 4  (four) hours as needed for pain. 30 tablet 0  . PRAVASTATIN SODIUM 40 MG PO TABS Oral Take 1 tablet (40 mg total) by mouth daily. 30 tablet 11  . TRAMADOL HCL 50 MG PO TABS Oral Take 1 tablet (50 mg total) by mouth every 6 (six) hours as needed for pain. 20 tablet 0    BP 126/74  Pulse 97  Temp(Src) 97.4 F (36.3 C) (Oral)  Resp 16  SpO2 98%  Physical Exam  Constitutional: Vital signs reviewed.  Patient is a well-developed and well-nourished in no acute distress and cooperative with exam. Alert and oriented x3.  Neck: Supple, Trachea midline normal ROM, No JVD, mass, thyromegaly, or carotid bruit present.  Cardiovascular: RRR, S1 normal, S2 normal, no MRG, pulses symmetric and intact bilaterally Pulmonary/Chest: CTAB, no wheezes, rales, or rhonchi Abdominal: Soft. Non-tender, non-distended, bowel sounds are normal, no masses, organomegaly, or guarding present.  Musculoskeletal: No joint deformities, erythema, or stiffness, ROM full and nontender Neurological: A&O x3, Strenght is normal and symmetric bilaterally, cranial nerve II-XII are grossly intact, no focal motor deficit, sensory intact to light touch bilaterally.  straight leg test on the right side positive   ED Course  Procedures (including critical care time)  Labs Reviewed - No data to display No results found.   1. Sciatic pain    - Patient symptoms and physical exam findings consistent with sciatic pain - Will provide prescriptions for analgesic - Patient was advised if the symptoms get worse over the next one to 2 weeks he needs to see primary care physician   MDM  Sciatic pain, analgesic provided by prescription        Dorothea Ogle, MD 05/31/12 [phone]

## 2012-06-04 ENCOUNTER — Other Ambulatory Visit: Payer: Self-pay | Admitting: *Deleted

## 2012-06-04 DIAGNOSIS — M3390 Dermatopolymyositis, unspecified, organ involvement unspecified: Secondary | ICD-10-CM

## 2012-06-04 DIAGNOSIS — E119 Type 2 diabetes mellitus without complications: Secondary | ICD-10-CM

## 2012-06-05 MED ORDER — METFORMIN HCL 1000 MG PO TABS: 1000.0000 mg | ORAL_TABLET | Freq: Two times a day (BID) | ORAL | Status: DC

## 2012-06-05 MED ORDER — GLIPIZIDE 10 MG PO TABS: 10.0000 mg | ORAL_TABLET | Freq: Two times a day (BID) | ORAL | Status: DC

## 2012-06-05 MED ORDER — LISINOPRIL-HYDROCHLOROTHIAZIDE 20-25 MG PO TABS: 1.0000 | ORAL_TABLET | Freq: Every day | ORAL | Status: DC

## 2012-06-12 ENCOUNTER — Telehealth: Payer: Self-pay | Admitting: *Deleted

## 2012-06-12 NOTE — Telephone Encounter (Signed)
I left a message on his home number asking him to call & make an appt

## 2012-07-19 ENCOUNTER — Other Ambulatory Visit: Payer: Self-pay | Admitting: *Deleted

## 2012-07-19 DIAGNOSIS — E119 Type 2 diabetes mellitus without complications: Secondary | ICD-10-CM

## 2012-07-19 MED ORDER — METFORMIN HCL 1000 MG PO TABS: 1000.0000 mg | ORAL_TABLET | Freq: Two times a day (BID) | ORAL | Status: AC

## 2012-07-19 MED ORDER — LISINOPRIL-HYDROCHLOROTHIAZIDE 20-25 MG PO TABS: 1.0000 | ORAL_TABLET | Freq: Every day | ORAL | Status: DC

## 2012-07-19 NOTE — Telephone Encounter (Signed)
Note sent to front desk pool for appt as directed. 

## 2012-07-19 NOTE — Telephone Encounter (Signed)
Please schedule a follow-up appointment with patient's PCP. 

## 2012-07-26 ENCOUNTER — Ambulatory Visit: Payer: Self-pay

## 2012-08-06 ENCOUNTER — Encounter: Payer: Self-pay | Admitting: Internal Medicine

## 2012-10-09 ENCOUNTER — Other Ambulatory Visit: Payer: Self-pay | Admitting: Infectious Diseases

## 2012-10-25 ENCOUNTER — Other Ambulatory Visit: Payer: Self-pay | Admitting: Internal Medicine

## 2012-10-25 ENCOUNTER — Encounter: Payer: Self-pay | Admitting: Internal Medicine

## 2012-10-25 NOTE — Telephone Encounter (Signed)
Flag sent to front desk pool to sch [facility] res for long appt slot in Nov.

## 2012-10-25 NOTE — Telephone Encounter (Signed)
Last seen March. DM is uncontrolled. Pls sch [facility] res for long appt slot in Nov. Thanks

## 2012-11-05 ENCOUNTER — Other Ambulatory Visit: Payer: Self-pay | Admitting: *Deleted

## 2012-11-05 DIAGNOSIS — B20 Human immunodeficiency virus [HIV] disease: Secondary | ICD-10-CM

## 2012-11-05 MED ORDER — EFAVIRENZ-EMTRICITAB-TENOFOVIR 600-200-300 MG PO TABS: 1.0000 | ORAL_TABLET | Freq: Every day | ORAL | Status: DC

## 2013-01-02 ENCOUNTER — Encounter: Payer: Self-pay | Admitting: Internal Medicine

## 2013-01-22 ENCOUNTER — Ambulatory Visit: Payer: Self-pay

## 2013-01-27 ENCOUNTER — Ambulatory Visit: Payer: Self-pay

## 2013-02-25 ENCOUNTER — Ambulatory Visit: Payer: Self-pay

## 2013-03-05 ENCOUNTER — Ambulatory Visit: Payer: Self-pay

## 2013-03-06 ENCOUNTER — Other Ambulatory Visit: Payer: Self-pay | Admitting: Infectious Disease

## 2013-03-06 ENCOUNTER — Other Ambulatory Visit (INDEPENDENT_AMBULATORY_CARE_PROVIDER_SITE_OTHER): Payer: No Typology Code available for payment source

## 2013-03-06 DIAGNOSIS — B20 Human immunodeficiency virus [HIV] disease: Secondary | ICD-10-CM

## 2013-03-06 DIAGNOSIS — Z79899 Other long term (current) drug therapy: Secondary | ICD-10-CM

## 2013-03-06 DIAGNOSIS — Z113 Encounter for screening for infections with a predominantly sexual mode of transmission: Secondary | ICD-10-CM

## 2013-03-06 LAB — COMPREHENSIVE METABOLIC PANEL
Albumin: 4 g/dL (ref 3.5–5.2)
CO2: 25 mEq/L (ref 19–32)
Calcium: 9.7 mg/dL (ref 8.4–10.5)
Chloride: 102 mEq/L (ref 96–112)
Glucose, Bld: 269 mg/dL — ABNORMAL HIGH (ref 70–99)
Potassium: 4.8 mEq/L (ref 3.5–5.3)
Sodium: 137 mEq/L (ref 135–145)
Total Protein: 6.8 g/dL (ref 6.0–8.3)

## 2013-03-06 LAB — CBC WITH DIFFERENTIAL/PLATELET
Eosinophils Absolute: 0.1 10*3/uL (ref 0.0–0.7)
Hemoglobin: 15.5 g/dL (ref 13.0–17.0)
Lymphocytes Relative: 17 % (ref 12–46)
Lymphs Abs: 0.9 10*3/uL (ref 0.7–4.0)
MCH: 34.2 pg — ABNORMAL HIGH (ref 26.0–34.0)
Monocytes Relative: 9 % (ref 3–12)
Neutro Abs: 3.8 10*3/uL (ref 1.7–7.7)
Neutrophils Relative %: 73 % (ref 43–77)
RBC: 4.53 MIL/uL (ref 4.22–5.81)

## 2013-03-06 LAB — RPR

## 2013-03-06 LAB — LIPID PANEL: Cholesterol: 181 mg/dL (ref 0–200)

## 2013-03-07 ENCOUNTER — Other Ambulatory Visit: Payer: Self-pay

## 2013-03-07 LAB — URINALYSIS, MICROSCOPIC ONLY
Casts: NONE SEEN
Crystals: NONE SEEN

## 2013-03-07 LAB — URINALYSIS, ROUTINE W REFLEX MICROSCOPIC
Bilirubin Urine: NEGATIVE
Glucose, UA: >1000 mg/dL — AB
Leukocytes, UA: NEGATIVE
Specific Gravity, Urine: 1.022 (ref 1.005–1.030)

## 2013-03-07 LAB — T-HELPER CELL (CD4) - (RCID CLINIC ONLY)
CD4 % Helper T Cell: 30 % — ABNORMAL LOW (ref 33–55)
CD4 T Cell Abs: 290 uL — ABNORMAL LOW (ref 400–2700)

## 2013-03-09 LAB — HIV-1 RNA ULTRAQUANT REFLEX TO GENTYP+: HIV 1 RNA Quant: <20 copies/mL (ref <20)

## 2013-03-24 ENCOUNTER — Ambulatory Visit (INDEPENDENT_AMBULATORY_CARE_PROVIDER_SITE_OTHER): Payer: No Typology Code available for payment source | Admitting: Infectious Diseases

## 2013-03-24 ENCOUNTER — Encounter: Payer: Self-pay | Admitting: Infectious Diseases

## 2013-03-24 ENCOUNTER — Other Ambulatory Visit: Payer: Self-pay | Admitting: *Deleted

## 2013-03-24 VITALS — BP 149/92 | HR 96 | Temp 98.1°F | Ht 73.0 in | Wt 232.0 lb

## 2013-03-24 DIAGNOSIS — E11339 Type 2 diabetes mellitus with moderate nonproliferative diabetic retinopathy without macular edema: Secondary | ICD-10-CM

## 2013-03-24 DIAGNOSIS — E785 Hyperlipidemia, unspecified: Secondary | ICD-10-CM

## 2013-03-24 DIAGNOSIS — F17200 Nicotine dependence, unspecified, uncomplicated: Secondary | ICD-10-CM

## 2013-03-24 DIAGNOSIS — I10 Essential (primary) hypertension: Secondary | ICD-10-CM

## 2013-03-24 DIAGNOSIS — E119 Type 2 diabetes mellitus without complications: Secondary | ICD-10-CM

## 2013-03-24 DIAGNOSIS — B20 Human immunodeficiency virus [HIV] disease: Secondary | ICD-10-CM

## 2013-03-24 MED ORDER — LISINOPRIL-HYDROCHLOROTHIAZIDE 20-25 MG PO TABS: 1.0000 | ORAL_TABLET | Freq: Every day | ORAL | Status: DC

## 2013-03-24 NOTE — Assessment & Plan Note (Signed)
Has improved, will continue to watch.

## 2013-03-24 NOTE — Assessment & Plan Note (Signed)
Will increase his dose of hctz/ace-i

## 2013-03-24 NOTE — Assessment & Plan Note (Signed)
Encouraged pt to quit.  

## 2013-03-24 NOTE — Assessment & Plan Note (Signed)
Doing well. Will cont his current ART. Given condoms. Needs Hep B S Ab f/u. See him back in 6 months.

## 2013-03-24 NOTE — Assessment & Plan Note (Signed)
Has had ophtho in last year. Appreciate PCP f/u

## 2013-03-24 NOTE — Progress Notes (Signed)
  Subjective:    Patient ID: Harold Crosby, male    DOB: Aug 28, 1971, 42 y.o.   MRN: [id number]  HPI 42 yo M with hx of HIV+, DM2 on insulin, hyperlipidemia (trgi > 500).  Current smoker.  FSG have been a little better, insulin dose increased. Has never used NTG. No problems with taking pravastatin. Had gotten wrong dose of HCTZ/lisinopril (too low) at prev apt. BP has been up. Hasn't checked at home.  Wt down 28# since 2012  HIV 1 RNA Quant (copies/mL)  Date Value  03/06/2013 <20   07/31/2011 <20   09/09/2010 <20 copies/mL      CD4 T Cell Abs (cmm)  Date Value  03/06/2013 290*  07/31/2011 280*  09/09/2010 220*   Lab Results  Component Value Date   CHOL 181 03/06/2013   HDL 34* 03/06/2013   LDLCALC 80 03/06/2013   TRIG 337* 03/06/2013   CHOLHDL 5.3 03/06/2013    Review of Systems  Constitutional: Negative for appetite change and unexpected weight change.  Respiratory: Negative for shortness of breath.   Cardiovascular: Negative for chest pain.  Neurological: Positive for numbness. Negative for headaches.       Numbness anterior thighs from his back.        Objective:   Physical Exam  Constitutional: He appears well-developed and well-nourished.  HENT:  Mouth/Throat: Dental caries present. No oropharyngeal exudate.  Eyes: EOM are normal. Pupils are equal, round, and reactive to light.  Neck: Neck supple.  Cardiovascular: Normal rate, regular rhythm and normal heart sounds.   Pulmonary/Chest: Effort normal and breath sounds normal.  Abdominal: Soft. Bowel sounds are normal. There is no tenderness.  Lymphadenopathy:    He has no cervical adenopathy.          Assessment & Plan:

## 2013-03-24 NOTE — Assessment & Plan Note (Signed)
Greatly appreciate his PCP f/u.  

## 2013-07-03 ENCOUNTER — Other Ambulatory Visit: Payer: Self-pay

## 2013-09-10 ENCOUNTER — Other Ambulatory Visit: Payer: No Typology Code available for payment source

## 2013-09-10 DIAGNOSIS — B20 Human immunodeficiency virus [HIV] disease: Secondary | ICD-10-CM

## 2013-09-10 LAB — CBC
HCT: 43.7 % (ref 39.0–52.0)
Hemoglobin: 15.2 g/dL (ref 13.0–17.0)
MCH: 34.8 pg — ABNORMAL HIGH (ref 26.0–34.0)
MCHC: 34.8 g/dL (ref 30.0–36.0)
RBC: 4.37 MIL/uL (ref 4.22–5.81)

## 2013-09-10 LAB — COMPREHENSIVE METABOLIC PANEL
Albumin: 4.2 g/dL (ref 3.5–5.2)
Alkaline Phosphatase: 61 U/L (ref 39–117)
BUN: 18 mg/dL (ref 6–23)
CO2: 28 mEq/L (ref 19–32)
Glucose, Bld: 347 mg/dL — ABNORMAL HIGH (ref 70–99)
Potassium: 4.9 mEq/L (ref 3.5–5.3)
Total Protein: 6.8 g/dL (ref 6.0–8.3)

## 2013-09-11 LAB — T-HELPER CELL (CD4) - (RCID CLINIC ONLY)
CD4 % Helper T Cell: 33 % (ref 33–55)
CD4 T Cell Abs: 400 /uL (ref 400–2700)

## 2013-09-11 LAB — HIV-1 RNA QUANT-NO REFLEX-BLD: HIV-1 RNA Quant, Log: <1.3 {Log} (ref <1.30)

## 2013-09-24 ENCOUNTER — Telehealth: Payer: Self-pay | Admitting: *Deleted

## 2013-09-24 ENCOUNTER — Ambulatory Visit: Payer: Self-pay | Admitting: Infectious Diseases

## 2013-09-24 NOTE — Telephone Encounter (Signed)
Harold Crosby gave me Harold Crosby's name because he is in need of help with his medication.  I called Harold Crosby.  He has cancelled his insurance and can not get ADAP until after 10-08-13.  I checked to see if I could get him an emergency supply of Atripla.  He had gotten the emergency supply in 2009 and does not qualify.  He is coming in tomorrow 09-25-13 to apply for a temporary supply (3 months) with [employer].

## 2013-10-08 ENCOUNTER — Other Ambulatory Visit: Payer: Self-pay | Admitting: Licensed Clinical Social Worker

## 2013-10-08 DIAGNOSIS — B20 Human immunodeficiency virus [HIV] disease: Secondary | ICD-10-CM

## 2013-10-08 MED ORDER — EFAVIRENZ-EMTRICITAB-TENOFOVIR 600-200-300 MG PO TABS: 1.0000 | ORAL_TABLET | Freq: Every day | ORAL | Status: DC

## 2013-10-10 ENCOUNTER — Other Ambulatory Visit: Payer: Self-pay | Admitting: Licensed Clinical Social Worker

## 2013-10-10 DIAGNOSIS — B20 Human immunodeficiency virus [HIV] disease: Secondary | ICD-10-CM

## 2013-10-10 MED ORDER — EFAVIRENZ-EMTRICITAB-TENOFOVIR 600-200-300 MG PO TABS: 1.0000 | ORAL_TABLET | Freq: Every day | ORAL | Status: DC

## 2013-10-14 ENCOUNTER — Encounter: Payer: Self-pay | Admitting: Licensed Clinical Social Worker

## 2013-10-21 ENCOUNTER — Telehealth: Payer: Self-pay | Admitting: *Deleted

## 2013-10-21 NOTE — Telephone Encounter (Signed)
Received a fax from [employer]ri[employer] Patient Assistance.  Harold Crosby has been given temporary assistance from 10-20-13 to 01-18-14 or until ADAP coverage has been obained.

## 2013-10-30 ENCOUNTER — Other Ambulatory Visit: Payer: Self-pay

## 2013-11-05 ENCOUNTER — Encounter: Payer: Self-pay | Admitting: Infectious Diseases

## 2013-11-05 ENCOUNTER — Ambulatory Visit (INDEPENDENT_AMBULATORY_CARE_PROVIDER_SITE_OTHER): Payer: Self-pay | Admitting: Infectious Diseases

## 2013-11-05 VITALS — BP 98/67 | HR 97 | Temp 97.9°F | Ht 73.0 in | Wt 229.0 lb

## 2013-11-05 DIAGNOSIS — Z113 Encounter for screening for infections with a predominantly sexual mode of transmission: Secondary | ICD-10-CM

## 2013-11-05 DIAGNOSIS — B20 Human immunodeficiency virus [HIV] disease: Secondary | ICD-10-CM

## 2013-11-05 DIAGNOSIS — F17200 Nicotine dependence, unspecified, uncomplicated: Secondary | ICD-10-CM

## 2013-11-05 DIAGNOSIS — Z23 Encounter for immunization: Secondary | ICD-10-CM

## 2013-11-05 DIAGNOSIS — E785 Hyperlipidemia, unspecified: Secondary | ICD-10-CM

## 2013-11-05 DIAGNOSIS — E119 Type 2 diabetes mellitus without complications: Secondary | ICD-10-CM

## 2013-11-05 DIAGNOSIS — Z79899 Other long term (current) drug therapy: Secondary | ICD-10-CM

## 2013-11-05 DIAGNOSIS — I10 Essential (primary) hypertension: Secondary | ICD-10-CM

## 2013-11-05 LAB — BASIC METABOLIC PANEL
CO2: 27 mEq/L (ref 19–32)
Chloride: 101 mEq/L (ref 96–112)
Creat: 1.08 mg/dL (ref 0.50–1.35)
Potassium: 4.7 mEq/L (ref 3.5–5.3)

## 2013-11-05 NOTE — Assessment & Plan Note (Signed)
He appears to be doing well. Will have him f/u with PCP.

## 2013-11-05 NOTE — Addendum Note (Signed)
Addended by: Starleen Arms D on: 11/05/2013 05:26 PM   Modules accepted: Orders

## 2013-11-05 NOTE — Assessment & Plan Note (Signed)
Will check his HLA to see if he can go on Triumeq. Will reheck his Cr as well, possible candidate for complera. Restart hep B series. See him back in2-3 months.

## 2013-11-05 NOTE — Assessment & Plan Note (Signed)
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

## 2013-11-05 NOTE — Assessment & Plan Note (Signed)
He is trying to cut back.

## 2013-11-05 NOTE — Assessment & Plan Note (Signed)
Continue statin. 

## 2013-11-05 NOTE — Progress Notes (Signed)
  Subjective:    Patient ID: Harold Crosby, male    DOB: 11/12/1971, 42 y.o.   MRN: [id number]  HPI 42 yo M with hx of HIV+, DM2 on insulin, hyperlipidemia.  Current smoker. No problems with taking pravastatin. Has changed his diet to try and decrease his lipids. His Bp is slightly low today, his Cr was 1.44 at last blood draw (previously 1.06).  Ran out of atripla for 1 month, off ADAP as he got insurance and it wouldn't pay for his ART. He then dropped the insurance and has gone back to ADAP. He has felt better off ART (more energy, sleeping better).  Has cut down on smoking, using e-cig.   HIV 1 RNA Quant (copies/mL)  Date Value  09/10/2013 <20   03/06/2013 <20   07/31/2011 <20      CD4 T Cell Abs (/uL)  Date Value  09/10/2013 400   03/06/2013 290*  07/31/2011 280*    Review of Systems  Constitutional: Negative for fever, chills, appetite change and unexpected weight change.  Respiratory: Positive for cough. Negative for shortness of breath.   Cardiovascular: Negative for chest pain.  Gastrointestinal: Negative for nausea and diarrhea.  Genitourinary: Negative for difficulty urinating.  Neurological: Negative for dizziness, light-headedness and headaches.  denies diabetic foot ulcers.      Objective:   Physical Exam  Constitutional: He appears well-developed and well-nourished.  HENT:  Mouth/Throat: No oropharyngeal exudate.  Eyes: EOM are normal. Pupils are equal, round, and reactive to light.  Neck: Neck supple.  Cardiovascular: Normal rate, regular rhythm and normal heart sounds.   Pulmonary/Chest: Effort normal and breath sounds normal.  Abdominal: Soft. Bowel sounds are normal. He exhibits no distension. There is no tenderness.  Musculoskeletal: He exhibits no edema.  Lymphadenopathy:    He has no cervical adenopathy.          Assessment & Plan:

## 2013-11-06 ENCOUNTER — Other Ambulatory Visit: Payer: Self-pay | Admitting: *Deleted

## 2013-11-06 DIAGNOSIS — B20 Human immunodeficiency virus [HIV] disease: Secondary | ICD-10-CM

## 2013-11-06 MED ORDER — EFAVIRENZ-EMTRICITAB-TENOFOVIR 600-200-300 MG PO TABS: 1.0000 | ORAL_TABLET | Freq: Every day | ORAL | Status: DC

## 2013-11-10 ENCOUNTER — Other Ambulatory Visit: Payer: Self-pay | Admitting: Licensed Clinical Social Worker

## 2013-11-10 DIAGNOSIS — B20 Human immunodeficiency virus [HIV] disease: Secondary | ICD-10-CM

## 2013-11-10 MED ORDER — EFAVIRENZ-EMTRICITAB-TENOFOVIR 600-200-300 MG PO TABS: 1.0000 | ORAL_TABLET | Freq: Every day | ORAL | Status: DC

## 2013-11-11 LAB — HLA B*5701

## 2013-11-12 ENCOUNTER — Telehealth: Payer: Self-pay | Admitting: Infectious Diseases

## 2013-11-12 NOTE — Telephone Encounter (Signed)
Called pt and we will leave him on atripla. Cr was normal at last blood draw. HLA (-).

## 2013-12-22 ENCOUNTER — Telehealth: Payer: Self-pay | Admitting: *Deleted

## 2013-12-22 NOTE — Telephone Encounter (Signed)
Received a fax from [employer] stating Harold Crosby's temporary assistance for Atripla is ending.  [name] now has ADAP.  He no longer needs the assistance from Atripla.

## 2014-01-20 ENCOUNTER — Encounter: Payer: Self-pay | Admitting: *Deleted

## 2014-01-20 ENCOUNTER — Other Ambulatory Visit: Payer: Self-pay | Admitting: Licensed Clinical Social Worker

## 2014-01-20 ENCOUNTER — Other Ambulatory Visit (INDEPENDENT_AMBULATORY_CARE_PROVIDER_SITE_OTHER): Payer: Self-pay

## 2014-01-20 ENCOUNTER — Ambulatory Visit: Payer: Self-pay

## 2014-01-20 DIAGNOSIS — E785 Hyperlipidemia, unspecified: Secondary | ICD-10-CM

## 2014-01-20 DIAGNOSIS — B20 Human immunodeficiency virus [HIV] disease: Secondary | ICD-10-CM

## 2014-01-20 DIAGNOSIS — Z113 Encounter for screening for infections with a predominantly sexual mode of transmission: Secondary | ICD-10-CM

## 2014-01-20 LAB — CBC WITH DIFFERENTIAL/PLATELET
BASOS ABS: 0 10*3/uL (ref 0.0–0.1)
BASOS PCT: 0 % (ref 0–1)
Eosinophils Absolute: 0.1 10*3/uL (ref 0.0–0.7)
Eosinophils Relative: 2 % (ref 0–5)
HCT: 44.2 % (ref 39.0–52.0)
Hemoglobin: 15.2 g/dL (ref 13.0–17.0)
LYMPHS ABS: 1 10*3/uL (ref 0.7–4.0)
Lymphocytes Relative: 20 % (ref 12–46)
MCH: 33.9 pg (ref 26.0–34.0)
MCHC: 34.4 g/dL (ref 30.0–36.0)
MCV: 98.4 fL (ref 78.0–100.0)
Monocytes Absolute: 0.6 10*3/uL (ref 0.1–1.0)
Monocytes Relative: 12 % (ref 3–12)
Neutro Abs: 3.2 10*3/uL (ref 1.7–7.7)
Neutrophils Relative %: 66 % (ref 43–77)
PLATELETS: 214 10*3/uL (ref 150–400)
RBC: 4.49 MIL/uL (ref 4.22–5.81)
RDW: 13.1 % (ref 11.5–15.5)
WBC: 4.8 10*3/uL (ref 4.0–10.5)

## 2014-01-20 LAB — COMPLETE METABOLIC PANEL WITH GFR
ALK PHOS: 75 U/L (ref 39–117)
ALT: 31 U/L (ref 0–53)
AST: 16 U/L (ref 0–37)
Albumin: 4.1 g/dL (ref 3.5–5.2)
BILIRUBIN TOTAL: 0.2 mg/dL — AB (ref 0.3–1.2)
BUN: 20 mg/dL (ref 6–23)
CO2: 27 mEq/L (ref 19–32)
Calcium: 9.4 mg/dL (ref 8.4–10.5)
Chloride: 100 mEq/L (ref 96–112)
Creat: 1.29 mg/dL (ref 0.50–1.35)
GFR, Est African American: 77 mL/min
GFR, Est Non African American: 67 mL/min
Glucose, Bld: 330 mg/dL — ABNORMAL HIGH (ref 70–99)
Potassium: 5.1 mEq/L (ref 3.5–5.3)
SODIUM: 134 meq/L — AB (ref 135–145)
TOTAL PROTEIN: 6.8 g/dL (ref 6.0–8.3)

## 2014-01-20 LAB — LIPID PANEL
CHOL/HDL RATIO: 4.8 ratio
CHOLESTEROL: 182 mg/dL (ref 0–200)
HDL: 38 mg/dL — ABNORMAL LOW (ref >39)
LDL Cholesterol: 86 mg/dL (ref 0–99)
TRIGLYCERIDES: 292 mg/dL — AB (ref <150)
VLDL: 58 mg/dL — ABNORMAL HIGH (ref 0–40)

## 2014-01-20 LAB — RPR

## 2014-01-20 MED ORDER — EFAVIRENZ-EMTRICITAB-TENOFOVIR 600-200-300 MG PO TABS: 1.0000 | ORAL_TABLET | Freq: Every day | ORAL | Status: DC

## 2014-01-21 LAB — HIV-1 RNA QUANT-NO REFLEX-BLD
HIV 1 RNA Quant: <20 copies/mL (ref <20)
HIV-1 RNA Quant, Log: <1.3 {Log} (ref <1.30)

## 2014-01-21 LAB — T-HELPER CELL (CD4) - (RCID CLINIC ONLY)
CD4 % Helper T Cell: 28 % — ABNORMAL LOW (ref 33–55)
CD4 T Cell Abs: 300 /uL — ABNORMAL LOW (ref 400–2700)

## 2014-02-09 ENCOUNTER — Other Ambulatory Visit: Payer: Self-pay | Admitting: *Deleted

## 2014-02-09 DIAGNOSIS — B20 Human immunodeficiency virus [HIV] disease: Secondary | ICD-10-CM

## 2014-02-09 DIAGNOSIS — I10 Essential (primary) hypertension: Secondary | ICD-10-CM

## 2014-02-09 MED ORDER — LISINOPRIL-HYDROCHLOROTHIAZIDE 20-25 MG PO TABS: 1.0000 | ORAL_TABLET | Freq: Every day | ORAL | Status: DC

## 2014-02-10 ENCOUNTER — Ambulatory Visit: Payer: Self-pay | Admitting: Infectious Diseases

## 2014-02-17 ENCOUNTER — Encounter: Payer: Self-pay | Admitting: Infectious Diseases

## 2014-02-17 ENCOUNTER — Ambulatory Visit (INDEPENDENT_AMBULATORY_CARE_PROVIDER_SITE_OTHER): Payer: Self-pay | Admitting: Infectious Diseases

## 2014-02-17 VITALS — BP 102/68 | HR 106 | Temp 97.8°F | Wt 230.0 lb

## 2014-02-17 DIAGNOSIS — F17200 Nicotine dependence, unspecified, uncomplicated: Secondary | ICD-10-CM

## 2014-02-17 DIAGNOSIS — I10 Essential (primary) hypertension: Secondary | ICD-10-CM

## 2014-02-17 DIAGNOSIS — E785 Hyperlipidemia, unspecified: Secondary | ICD-10-CM

## 2014-02-17 DIAGNOSIS — B20 Human immunodeficiency virus [HIV] disease: Secondary | ICD-10-CM

## 2014-02-17 DIAGNOSIS — E119 Type 2 diabetes mellitus without complications: Secondary | ICD-10-CM

## 2014-02-17 NOTE — Assessment & Plan Note (Signed)
He seems to be doing well. His Glc was fairly elevated on his blood draw here.  Appreciate partnering with his PCP.

## 2014-02-17 NOTE — Assessment & Plan Note (Signed)
Encouraged him to quit 

## 2014-02-17 NOTE — Progress Notes (Signed)
   Subjective:    Patient ID: Harold Crosby, male    DOB: 05-30-1971, 43 y.o.   MRN: [id number]  HPI  43 yo M with hx of HIV+, DM2 on insulin, hyperlipidemia.  Current smoker. No problems with taking pravastatin. Has been cutting back on smoking- vaping. Taking atripla, no problems with rashes. Does have abn dreams.  His Cr has been increasing over the past year.  Has cut back on his fatty foods.  FSG have been ~ 200. Has ophtho eval this week.   HIV 1 RNA Quant (copies/mL)  Date Value  01/20/2014 <20   09/10/2013 <20   03/06/2013 <20      CD4 T Cell Abs (/uL)  Date Value  01/20/2014 300*  09/10/2013 400   03/06/2013 290*    Review of Systems  Constitutional: Negative for appetite change and unexpected weight change.  Eyes: Negative for visual disturbance.  Gastrointestinal: Negative for diarrhea and constipation.  Genitourinary: Negative for difficulty urinating.  Neurological: Negative for headaches.      Objective:   Physical Exam  Constitutional: He appears well-developed and well-nourished.  Eyes: EOM are normal. Pupils are equal, round, and reactive to light.  Neck: Normal range of motion.  Cardiovascular: Normal rate, regular rhythm and normal heart sounds.   Pulmonary/Chest: Effort normal and breath sounds normal.  Abdominal: Soft. Bowel sounds are normal. There is no tenderness.  Lymphadenopathy:    He has no cervical adenopathy.          Assessment & Plan:

## 2014-02-17 NOTE — Assessment & Plan Note (Signed)
Appreciate his PCP partnering with us.

## 2014-02-17 NOTE — Assessment & Plan Note (Signed)
He is doing well. He is offered/refuses condoms. Need next Hep B vaccine. Will see him back in 6 months.

## 2014-02-17 NOTE — Assessment & Plan Note (Signed)
He is working on his diet. Appreciate his PCP partnering with us.

## 2014-02-18 ENCOUNTER — Telehealth: Payer: Self-pay | Admitting: *Deleted

## 2014-02-18 NOTE — Telephone Encounter (Signed)
Per Dr Ninetta LightsHatcher request faxed patient office note to Dr Caroline Sauger Williams office at [phone].

## 2014-04-02 ENCOUNTER — Other Ambulatory Visit: Payer: Self-pay

## 2014-07-23 ENCOUNTER — Ambulatory Visit: Payer: Self-pay

## 2014-07-23 ENCOUNTER — Other Ambulatory Visit (INDEPENDENT_AMBULATORY_CARE_PROVIDER_SITE_OTHER): Payer: Self-pay

## 2014-07-23 DIAGNOSIS — B20 Human immunodeficiency virus [HIV] disease: Secondary | ICD-10-CM

## 2014-07-23 DIAGNOSIS — Z79899 Other long term (current) drug therapy: Secondary | ICD-10-CM

## 2014-07-23 LAB — COMPREHENSIVE METABOLIC PANEL
ALK PHOS: 68 U/L (ref 39–117)
ALT: 42 U/L (ref 0–53)
AST: 21 U/L (ref 0–37)
Albumin: 4.1 g/dL (ref 3.5–5.2)
BILIRUBIN TOTAL: 0.2 mg/dL (ref 0.2–1.2)
BUN: 17 mg/dL (ref 6–23)
CO2: 22 mEq/L (ref 19–32)
CREATININE: 1.33 mg/dL (ref 0.50–1.35)
Calcium: 9.6 mg/dL (ref 8.4–10.5)
Chloride: 102 mEq/L (ref 96–112)
Glucose, Bld: 334 mg/dL — ABNORMAL HIGH (ref 70–99)
Potassium: 5.4 mEq/L — ABNORMAL HIGH (ref 3.5–5.3)
SODIUM: 137 meq/L (ref 135–145)
Total Protein: 6.8 g/dL (ref 6.0–8.3)

## 2014-07-23 LAB — CBC
HCT: 41.5 % (ref 39.0–52.0)
Hemoglobin: 14.4 g/dL (ref 13.0–17.0)
MCH: 34.4 pg — AB (ref 26.0–34.0)
MCHC: 34.7 g/dL (ref 30.0–36.0)
MCV: 99.3 fL (ref 78.0–100.0)
Platelets: 206 10*3/uL (ref 150–400)
RBC: 4.18 MIL/uL — AB (ref 4.22–5.81)
RDW: 13.6 % (ref 11.5–15.5)
WBC: 5.3 10*3/uL (ref 4.0–10.5)

## 2014-07-23 LAB — LIPID PANEL
CHOL/HDL RATIO: 5.3 ratio
Cholesterol: 189 mg/dL (ref 0–200)
HDL: 36 mg/dL — ABNORMAL LOW (ref >39)
Triglycerides: 413 mg/dL — ABNORMAL HIGH (ref <150)

## 2014-07-24 LAB — HIV-1 RNA QUANT-NO REFLEX-BLD: HIV-1 RNA Quant, Log: <1.3 {Log} (ref <1.30)

## 2014-07-24 LAB — T-HELPER CELL (CD4) - (RCID CLINIC ONLY)
CD4 % Helper T Cell: 31 % — ABNORMAL LOW (ref 33–55)
CD4 T Cell Abs: 250 /uL — ABNORMAL LOW (ref 400–2700)

## 2014-08-12 ENCOUNTER — Ambulatory Visit (INDEPENDENT_AMBULATORY_CARE_PROVIDER_SITE_OTHER): Payer: Self-pay | Admitting: Infectious Diseases

## 2014-08-12 ENCOUNTER — Encounter: Payer: Self-pay | Admitting: Infectious Diseases

## 2014-08-12 VITALS — BP 127/78 | HR 103 | Temp 98.8°F | Ht 72.0 in | Wt 239.0 lb

## 2014-08-12 DIAGNOSIS — Z113 Encounter for screening for infections with a predominantly sexual mode of transmission: Secondary | ICD-10-CM

## 2014-08-12 DIAGNOSIS — B20 Human immunodeficiency virus [HIV] disease: Secondary | ICD-10-CM

## 2014-08-12 DIAGNOSIS — F17200 Nicotine dependence, unspecified, uncomplicated: Secondary | ICD-10-CM

## 2014-08-12 DIAGNOSIS — Z79899 Other long term (current) drug therapy: Secondary | ICD-10-CM

## 2014-08-12 DIAGNOSIS — R351 Nocturia: Secondary | ICD-10-CM

## 2014-08-12 DIAGNOSIS — E119 Type 2 diabetes mellitus without complications: Secondary | ICD-10-CM

## 2014-08-12 DIAGNOSIS — Z23 Encounter for immunization: Secondary | ICD-10-CM

## 2014-08-12 NOTE — Assessment & Plan Note (Signed)
His Cr is fairly stable, not clear if from tenofovir or DM or both. No change in meds for now. Offered/refuses condoms. He gets flu shot today.  Will see him back in 6 months.

## 2014-08-12 NOTE — Assessment & Plan Note (Signed)
Encouraged him to quit smoking.  

## 2014-08-12 NOTE — Progress Notes (Signed)
   Subjective:    Patient ID: Harold Crosby, male    DOB: Jun 27, 1971, 43 y.o.   MRN: [id number]  HPI 43 yo M with hx of HIV+, DM2 on insulin, hyperlipidemia.  Current smoker. No problems with taking pravastatin.  Has been cutting back on smoking- vaping. Taking atripla without problems.  FSG have been ~ 200. His insulin has had to be increased.  Worried he has a prostrate problem- has inconsistent urine flow, nocturia. No dysuria, no cloudiness. No flank pain. No f/c. Having more difficulty obtaining erections.   HIV 1 RNA Quant (copies/mL)  Date Value  07/23/2014 <20   01/20/2014 <20   09/10/2013 <20      CD4 T Cell Abs (/uL)  Date Value  07/23/2014 250*  01/20/2014 300*  09/10/2013 400     Review of Systems  Constitutional: Negative for appetite change and unexpected weight change.  Gastrointestinal: Negative for diarrhea and constipation.  Genitourinary: Positive for difficulty urinating. Negative for dysuria and flank pain.  Neurological: Negative for numbness.  incomplete stool evacuation.      Objective:   Physical Exam  Constitutional: He appears well-developed and well-nourished.  HENT:  Mouth/Throat: No oropharyngeal exudate.  Eyes: EOM are normal. Pupils are equal, round, and reactive to light.  Neck: Neck supple.  Cardiovascular: Normal rate, regular rhythm and normal heart sounds.   Pulmonary/Chest: Effort normal and breath sounds normal.  Abdominal: Soft. Bowel sounds are normal. He exhibits no distension. There is no tenderness.  Musculoskeletal: He exhibits no edema.       Feet:  Lymphadenopathy:    He has no cervical adenopathy.          Assessment & Plan:

## 2014-08-12 NOTE — Assessment & Plan Note (Signed)
Encouraged him to f/u with PCP.  Needs ophtho eval. Will defer to Dr Mayford KnifeWilliams to set this up.

## 2014-08-12 NOTE — Assessment & Plan Note (Signed)
Not clear if this is his DM or he has prostate dx (he is worried about this). Will screen him, have him eval.

## 2014-08-13 LAB — URINALYSIS, ROUTINE W REFLEX MICROSCOPIC
Bilirubin Urine: NEGATIVE
Glucose, UA: >1000 mg/dL — AB
HGB URINE DIPSTICK: NEGATIVE
KETONES UR: NEGATIVE mg/dL
NITRITE: NEGATIVE
PROTEIN: NEGATIVE mg/dL
Specific Gravity, Urine: 1.024 (ref 1.005–1.030)
UROBILINOGEN UA: 0.2 mg/dL (ref 0.0–1.0)
pH: 5.5 (ref 5.0–8.0)

## 2014-08-13 LAB — URINALYSIS, MICROSCOPIC ONLY
BACTERIA UA: NONE SEEN
Casts: NONE SEEN
Crystals: NONE SEEN
Squamous Epithelial / LPF: NONE SEEN

## 2014-08-13 LAB — PSA: PSA: 0.07 ng/mL (ref <=4.00)

## 2014-08-25 ENCOUNTER — Telehealth: Payer: Self-pay | Admitting: *Deleted

## 2014-08-25 NOTE — Telephone Encounter (Signed)
Notified patient of his lab results from last visit.  Patient stated he has a PCP, RN made the changes in EPIC.  RN will forward labs and notes at patient's request.  Please advise if patient still needs urology referral - he states he is uninsured. Andree Coss, RN

## 2014-08-26 NOTE — Telephone Encounter (Signed)
PSA is normal. He can be eval for nocturia if he wishes.

## 2014-09-28 ENCOUNTER — Other Ambulatory Visit: Payer: Self-pay | Admitting: *Deleted

## 2014-09-28 DIAGNOSIS — B20 Human immunodeficiency virus [HIV] disease: Secondary | ICD-10-CM

## 2014-09-28 MED ORDER — EFAVIRENZ-EMTRICITAB-TENOFOVIR 600-200-300 MG PO TABS: 1.0000 | ORAL_TABLET | Freq: Every day | ORAL | Status: DC

## 2014-10-05 ENCOUNTER — Other Ambulatory Visit: Payer: Self-pay | Admitting: Infectious Diseases

## 2014-10-05 DIAGNOSIS — I10 Essential (primary) hypertension: Secondary | ICD-10-CM

## 2014-10-19 ENCOUNTER — Telehealth: Payer: Self-pay

## 2014-10-19 NOTE — Telephone Encounter (Signed)
Patient here regarding out of Atripla.  He states there was a problem with his ADAP application.   He was given a 30 day supply of Atripla.   Laurell Josephsammy K Evetta Renner, RN

## 2014-11-15 ENCOUNTER — Other Ambulatory Visit: Payer: Self-pay | Admitting: Infectious Diseases

## 2014-11-24 ENCOUNTER — Other Ambulatory Visit: Payer: Self-pay | Admitting: *Deleted

## 2014-11-24 MED ORDER — EFAVIRENZ-EMTRICITAB-TENOFOVIR 600-200-300 MG PO TABS: 1.0000 | ORAL_TABLET | Freq: Every day | ORAL | Status: DC

## 2015-03-01 ENCOUNTER — Ambulatory Visit: Payer: Self-pay

## 2015-03-02 ENCOUNTER — Other Ambulatory Visit: Payer: Self-pay | Admitting: Licensed Clinical Social Worker

## 2015-03-02 DIAGNOSIS — B20 Human immunodeficiency virus [HIV] disease: Secondary | ICD-10-CM

## 2015-03-02 MED ORDER — EFAVIRENZ-EMTRICITAB-TENOFOVIR 600-200-300 MG PO TABS: 1.0000 | ORAL_TABLET | Freq: Every day | ORAL | Status: DC

## 2015-07-01 ENCOUNTER — Other Ambulatory Visit (INDEPENDENT_AMBULATORY_CARE_PROVIDER_SITE_OTHER): Payer: Self-pay

## 2015-07-01 DIAGNOSIS — Z113 Encounter for screening for infections with a predominantly sexual mode of transmission: Secondary | ICD-10-CM

## 2015-07-01 DIAGNOSIS — Z79899 Other long term (current) drug therapy: Secondary | ICD-10-CM

## 2015-07-01 DIAGNOSIS — B20 Human immunodeficiency virus [HIV] disease: Secondary | ICD-10-CM

## 2015-07-01 LAB — COMPREHENSIVE METABOLIC PANEL
ALT: 33 U/L (ref 0–53)
AST: 20 U/L (ref 0–37)
Albumin: 3.9 g/dL (ref 3.5–5.2)
Alkaline Phosphatase: 95 U/L (ref 39–117)
BILIRUBIN TOTAL: 0.3 mg/dL (ref 0.2–1.2)
BUN: 16 mg/dL (ref 6–23)
CO2: 22 meq/L (ref 19–32)
Calcium: 9.8 mg/dL (ref 8.4–10.5)
Chloride: 103 mEq/L (ref 96–112)
Creat: 1.33 mg/dL (ref 0.50–1.35)
Glucose, Bld: 252 mg/dL — ABNORMAL HIGH (ref 70–99)
Potassium: 4.8 mEq/L (ref 3.5–5.3)
Sodium: 136 mEq/L (ref 135–145)
TOTAL PROTEIN: 6.7 g/dL (ref 6.0–8.3)

## 2015-07-01 LAB — LIPID PANEL
CHOLESTEROL: 223 mg/dL — AB (ref 0–200)
HDL: 31 mg/dL — ABNORMAL LOW (ref >=40)
Total CHOL/HDL Ratio: 7.2 Ratio
Triglycerides: 569 mg/dL — ABNORMAL HIGH (ref <150)

## 2015-07-01 LAB — CBC
HCT: 42 % (ref 39.0–52.0)
HEMOGLOBIN: 14.5 g/dL (ref 13.0–17.0)
MCH: 34.9 pg — ABNORMAL HIGH (ref 26.0–34.0)
MCHC: 34.5 g/dL (ref 30.0–36.0)
MCV: 101 fL — ABNORMAL HIGH (ref 78.0–100.0)
MPV: 9.5 fL (ref 8.6–12.4)
Platelets: 223 10*3/uL (ref 150–400)
RBC: 4.16 MIL/uL — AB (ref 4.22–5.81)
RDW: 13.6 % (ref 11.5–15.5)
WBC: 7.2 10*3/uL (ref 4.0–10.5)

## 2015-07-02 LAB — T-HELPER CELL (CD4) - (RCID CLINIC ONLY)
CD4 % Helper T Cell: 34 % (ref 33–55)
CD4 T CELL ABS: 430 /uL (ref 400–2700)

## 2015-07-02 LAB — HIV-1 RNA QUANT-NO REFLEX-BLD

## 2015-07-02 LAB — RPR

## 2015-07-08 ENCOUNTER — Encounter (HOSPITAL_COMMUNITY): Payer: Self-pay | Admitting: Neurology

## 2015-07-08 ENCOUNTER — Emergency Department (HOSPITAL_COMMUNITY)
Admission: EM | Admit: 2015-07-08 | Discharge: 2015-07-09 | Disposition: A | Discharge status: Home / Self Care | Payer: Self-pay | Attending: Emergency Medicine | Admitting: Emergency Medicine

## 2015-07-08 ENCOUNTER — Emergency Department (HOSPITAL_COMMUNITY): Payer: Self-pay

## 2015-07-08 DIAGNOSIS — E119 Type 2 diabetes mellitus without complications: Secondary | ICD-10-CM | POA: Insufficient documentation

## 2015-07-08 DIAGNOSIS — E785 Hyperlipidemia, unspecified: Secondary | ICD-10-CM | POA: Insufficient documentation

## 2015-07-08 DIAGNOSIS — N50812 Left testicular pain: Secondary | ICD-10-CM

## 2015-07-08 DIAGNOSIS — I10 Essential (primary) hypertension: Secondary | ICD-10-CM | POA: Insufficient documentation

## 2015-07-08 DIAGNOSIS — Z21 Asymptomatic human immunodeficiency virus [HIV] infection status: Secondary | ICD-10-CM | POA: Insufficient documentation

## 2015-07-08 DIAGNOSIS — Z79899 Other long term (current) drug therapy: Secondary | ICD-10-CM | POA: Insufficient documentation

## 2015-07-08 DIAGNOSIS — G4700 Insomnia, unspecified: Secondary | ICD-10-CM | POA: Insufficient documentation

## 2015-07-08 DIAGNOSIS — R109 Unspecified abdominal pain: Secondary | ICD-10-CM

## 2015-07-08 DIAGNOSIS — N508 Other specified disorders of male genital organs: Secondary | ICD-10-CM | POA: Insufficient documentation

## 2015-07-08 DIAGNOSIS — Z794 Long term (current) use of insulin: Secondary | ICD-10-CM | POA: Insufficient documentation

## 2015-07-08 DIAGNOSIS — Z7982 Long term (current) use of aspirin: Secondary | ICD-10-CM | POA: Insufficient documentation

## 2015-07-08 DIAGNOSIS — Z720 Tobacco use: Secondary | ICD-10-CM | POA: Insufficient documentation

## 2015-07-08 DIAGNOSIS — I861 Scrotal varices: Secondary | ICD-10-CM | POA: Insufficient documentation

## 2015-07-08 DIAGNOSIS — M545 Low back pain: Secondary | ICD-10-CM | POA: Insufficient documentation

## 2015-07-08 LAB — BASIC METABOLIC PANEL
Anion gap: 13 (ref 5–15)
BUN: 28 mg/dL — ABNORMAL HIGH (ref 6–20)
CO2: 21 mmol/L — AB (ref 22–32)
Calcium: 9.5 mg/dL (ref 8.9–10.3)
Chloride: 99 mmol/L — ABNORMAL LOW (ref 101–111)
Creatinine, Ser: 1.8 mg/dL — ABNORMAL HIGH (ref 0.61–1.24)
GFR calc non Af Amer: 43 mL/min — ABNORMAL LOW (ref >60)
GFR, EST AFRICAN AMERICAN: 50 mL/min — AB (ref >60)
Glucose, Bld: 299 mg/dL — ABNORMAL HIGH (ref 65–99)
Potassium: 4.3 mmol/L (ref 3.5–5.1)
Sodium: 133 mmol/L — ABNORMAL LOW (ref 135–145)

## 2015-07-08 LAB — CBC WITH DIFFERENTIAL/PLATELET
Basophils Absolute: 0 10*3/uL (ref 0.0–0.1)
Basophils Relative: 0 % (ref 0–1)
EOS ABS: 0.1 10*3/uL (ref 0.0–0.7)
Eosinophils Relative: 2 % (ref 0–5)
HCT: 38.8 % — ABNORMAL LOW (ref 39.0–52.0)
HEMOGLOBIN: 13.6 g/dL (ref 13.0–17.0)
LYMPHS PCT: 19 % (ref 12–46)
Lymphs Abs: 1.4 10*3/uL (ref 0.7–4.0)
MCH: 34.7 pg — ABNORMAL HIGH (ref 26.0–34.0)
MCHC: 35.1 g/dL (ref 30.0–36.0)
MCV: 99 fL (ref 78.0–100.0)
MONO ABS: 0.7 10*3/uL (ref 0.1–1.0)
Monocytes Relative: 9 % (ref 3–12)
Neutro Abs: 5.1 10*3/uL (ref 1.7–7.7)
Neutrophils Relative %: 70 % (ref 43–77)
Platelets: 244 10*3/uL (ref 150–400)
RBC: 3.92 MIL/uL — ABNORMAL LOW (ref 4.22–5.81)
RDW: 13 % (ref 11.5–15.5)
WBC: 7.3 10*3/uL (ref 4.0–10.5)

## 2015-07-08 MED ORDER — HYDROCODONE-ACETAMINOPHEN 5-325 MG PO TABS: 1.0000 | ORAL_TABLET | ORAL | Status: DC | PRN

## 2015-07-08 MED ORDER — OXYCODONE-ACETAMINOPHEN 5-325 MG PO TABS
2.0000 | ORAL_TABLET | Freq: Once | ORAL | Status: AC
  Administered 2015-07-08: 2 via ORAL
  Filled 2015-07-08: qty 2

## 2015-07-08 NOTE — ED Notes (Signed)
The pt is back from ultra sound.  He reports that his pain is the same and that pain pills do not work on him

## 2015-07-08 NOTE — Discharge Instructions (Signed)
Take Vicodin for severe pain only. No driving or operating heavy machinery while taking vicodin. This medication may cause drowsiness.  Varicocele A varicocele is a swelling of veins in the scrotum (the bag of skin that contains the testicles). It is most common in young men. It occurs most often on the left side. Small or painless varicoceles do not need treatment. Most often, this is not a serious problem, but further tests may be needed to confirm the diagnosis. Surgery may be needed if complications of varicoceles arise. Rarely, varicoceles can reoccur after surgery. CAUSES  The swelling is due to blood backing up in the vein that leads from the testicle back to the body. Blood backs up because the valves inside the vein are not working properly. Veins normally return blood to the heart. Valves in veins are supposed to be one-way valves. They should not allow blood to flow backwards. If the valves do not work well, blood can pool in a vein and make it swell. The same thing happens with varicose veins in the leg. SYMPTOMS  A varicocele most often causes no symptoms. When they occur, symptoms include:   Swelling on one side of the scrotum.  Swelling that is more obvious when standing up.  A lumpy feeling in the scrotum.  Heaviness on one side of the scrotum.  Dull ache in the scrotum, especially after exercise or prolonged standing or sitting.  Slower growth or reduced size of the testicle on the side of the varicocele (in young males).  Problems with fertility can arise if the testicle does not grow normally. DIAGNOSIS  Varicocele is usually diagnosed by a physical exam. Sometimes ultrasonography is done. TREATMENT  Usually, varicoceles need no treatment. They are often routinely monitored on exam by your caregiver to ensure they do not slow the growth of the testicle on that side. Treatment may be needed if:  The varicocele is large.  There is a lot of pain.  The varicocele causes  a decrease in the size of the testicle in a growing adolescent.  The other testicle is absent or not normal.  Varicoceles are found on both sides of the scrotum.  There is pain when exercising.  There are fertility problems. There are two types of treatment:  Surgery. The surgeon ties off the swollen veins. Surgery may be done with an incision in the skin or through a laparoscope. The surgery is usually done in an outpatient setting. Outpatient means there is no overnight stay in a hospital.  Embolization. A small tube is placed in a vein and guided into the swollen veins. X-rays are used to guide the small tube. Tiny metal coils or other blocking items are put through the tube. This blocks swollen veins and the flow of blood. This is usually done in an outpatient setting without the use of general anesthesia. HOME CARE INSTRUCTIONS  To decrease discomfort:  Wear supportive underwear.  Use an athletic supporter for sports.  Only take over-the-counter or prescription medicines for pain or discomfort as directed by your caregiver. SEEK MEDICAL CARE IF:   Pain is increasing.  Swelling does not decrease when lying down.  Testicle is smaller.  The testicle becomes enlarged, swollen, red, or painful. Document Released: 03/19/2001 Document Revised: 03/04/2012 Document Reviewed: 03/23/2010 [facility] Patient Information 2015 [place]. This information is not intended to replace advice given to you by your health care provider. Make sure you discuss any questions you have with your health care provider.  Flank  Pain Flank pain refers to pain that is located on the side of the body between the upper abdomen and the back. The pain may occur over a short period of time (acute) or may be long-term or reoccurring (chronic). It may be mild or severe. Flank pain can be caused by many things. CAUSES  Some of the more common causes of flank pain include:  Muscle strains.   Muscle spasms.    A disease of your spine (vertebral disk disease).   A lung infection (pneumonia).   Fluid around your lungs (pulmonary edema).   A kidney infection.   Kidney stones.   A very painful skin rash caused by the chickenpox virus (shingles).   Gallbladder disease.  HOME CARE INSTRUCTIONS  Home care will depend on the cause of your pain. In general,  Rest as directed by your caregiver.  Drink enough fluids to keep your urine clear or pale yellow.  Only take over-the-counter or prescription medicines as directed by your caregiver. Some medicines may help relieve the pain.  Tell your caregiver about any changes in your pain.  Follow up with your caregiver as directed. SEEK IMMEDIATE MEDICAL CARE IF:   Your pain is not controlled with medicine.   You have new or worsening symptoms.  Your pain increases.   You have abdominal pain.   You have shortness of breath.   You have persistent nausea or vomiting.   You have swelling in your abdomen.   You feel faint or pass out.   You have blood in your urine.  You have a fever or persistent symptoms for more than 2-3 days.  You have a fever and your symptoms suddenly get worse. MAKE SURE YOU:   Understand these instructions.  Will watch your condition.  Will get help right away if you are not doing well or get worse. Document Released: 02/01/2006 Document Revised: 09/04/2012 Document Reviewed: 07/25/2012 [facility] Patient Information 2015 [place], [place]. This information is not intended to replace advice given to you by your health care provider. Make sure you discuss any questions you have with your health care provider.

## 2015-07-08 NOTE — ED Notes (Signed)
Urine spec not resulted yet

## 2015-07-08 NOTE — ED Notes (Signed)
Lab has been called about sending the urine results x 2

## 2015-07-08 NOTE — ED Notes (Signed)
2 days ago pain to left testicle that radiates up to left groin and around to back. Reports small amt urination since then. Denies swelling.

## 2015-07-08 NOTE — ED Provider Notes (Signed)
CSN: [id number]     Arrival date & time 07/08/15  1611 History   First MD Initiated Contact with Patient 07/08/15 2027     Chief Complaint  Patient presents with  . Testicle Pain     (Consider location/radiation/quality/duration/timing/severity/associated sxs/prior Treatment) HPI Comments: 44 y/o M PMHx HIV, DM2, HLD, HTN and insomnia c/o sudden onset L testicle pain radiating to his L groin, L side of abdomen and L mid-lower back. Pain has remained constant, no aggravating or alleviating factors. Denies testicular redness or swelling. Reports only a small amount of urine output over the past 2 days despite urgency. Denies dysuria or hematuria. Denies fever, chills, n/v, penile pain or discharge, rectal pain. No hx of kidney stones.  Patient is a 44 y.o. male presenting with testicular pain. The history is provided by the patient.  Testicle Pain Associated symptoms include abdominal pain.    Past Medical History  Diagnosis Date  . HIV (human immunodeficiency virus infection)     Followed by Dr. [name] in ID clinic.   . Type II diabetes mellitus   . Hyperlipidemia   . Tobacco abuse   . Hypertension   . History of thrush   . Insomnia    History reviewed. No pertinent past surgical history. Family History  Problem Relation Age of Onset  . Coronary artery disease Other   . Diabetes Other    History  Substance Use Topics  . Smoking status: Current Every Day Smoker -- 1.00 packs/day for 20 years    Types: Cigarettes  . Smokeless tobacco: Never Used     Comment: trying to quit  . Alcohol Use: No    Review of Systems  Gastrointestinal: Positive for abdominal pain.  Genitourinary: Positive for urgency, flank pain, decreased urine volume and testicular pain.  Musculoskeletal: Positive for back pain.  All other systems reviewed and are negative.     Allergies  Sulfonamide derivatives  Home Medications   Prior to Admission medications   Medication Sig Start Date End  Date Taking? Authorizing Provider  aspirin 81 MG tablet Take 81 mg by mouth daily.     Yes Historical Provider, MD  efavirenz-emtricitabine-tenofovir (ATRIPLA) 600-200-300 MG per tablet Take 1 tablet by mouth daily. 11/24/14  Yes Ginnie Smart, MD  glipiZIDE (GLUCOTROL) 10 MG tablet Take 1 tablet (10 mg total) by mouth 2 (two) times daily before a meal. 06/04/12  Yes Darnelle Maffucci, MD  insulin glargine (LANTUS) 100 UNIT/ML injection Inject 55 Units into the skin at bedtime. 03/08/12  Yes Darnelle Maffucci, MD  lisinopril-hydrochlorothiazide (PRINZIDE,ZESTORETIC) 20-25 MG per tablet TAKE 1 TABLET BY MOUTH DAILY 10/05/14  Yes Ginnie Smart, MD  metFORMIN (GLUCOPHAGE) 1000 MG tablet Take 1 tablet (1,000 mg total) by mouth 2 (two) times daily with a meal. 07/19/12  Yes Margarito Liner, MD  nitroGLYCERIN (NITROSTAT) 0.4 MG SL tablet Place 0.4 mg under the tongue every 5 (five) minutes as needed. Max 3 doses for relief of chest pain.      Yes Historical Provider, MD  pravastatin (PRAVACHOL) 40 MG tablet Take 1 tablet (40 mg total) by mouth daily. 04/04/12  Yes Darnelle Maffucci, MD  efavirenz-emtricitabine-tenofovir (ATRIPLA) 600-200-300 MG per tablet Take 1 tablet by mouth daily. Patient not taking: Reported on 07/08/2015 03/02/15   Gardiner Barefoot, MD  HYDROcodone-acetaminophen (NORCO/VICODIN) 5-325 MG per tablet Take 1-2 tablets by mouth every 4 (four) hours as needed. 07/08/15   Kaidance Pantoja M Chaniqua Brisby, PA-C  INSULIN SYRINGE .5CC/29G 29G X 1/2"  0.5 ML MISC by Does not apply route. Use it to draw insulin as prescribed.      Historical Provider, MD   BP 100/54 mmHg  Pulse 80  Temp(Src) 97.9 F (36.6 C) (Oral)  Resp 16  SpO2 94% Physical Exam  Constitutional: He is oriented to person, place, and time. He appears well-developed and well-nourished. No distress.  HENT:  Head: Normocephalic and atraumatic.  Eyes: Conjunctivae and EOM are normal.  Neck: Normal range of motion. Neck supple.  Cardiovascular: Normal rate,  regular rhythm and normal heart sounds.   Pulmonary/Chest: Effort normal and breath sounds normal.  Abdominal: Normal appearance. He exhibits no distension. There is tenderness. There is no rigidity, no rebound, no guarding and no CVA tenderness. Hernia confirmed negative in the right inguinal area and confirmed negative in the left inguinal area.    No peritoneal signs.  Genitourinary: Prostate is not tender. Right testis shows no swelling and no tenderness. Left testis shows tenderness (mild). Left testis shows no swelling. No penile tenderness. No discharge found.  Musculoskeletal: Normal range of motion. He exhibits no edema.  Neurological: He is alert and oriented to person, place, and time.  Skin: Skin is warm and dry.  Psychiatric: He has a normal mood and affect. His behavior is normal.  Nursing note and vitals reviewed.   ED Course  Procedures (including critical care time) Labs Review Labs Reviewed  CBC WITH DIFFERENTIAL/PLATELET - Abnormal; Notable for the following:    RBC 3.92 (*)    HCT 38.8 (*)    MCH 34.7 (*)    All other components within normal limits  BASIC METABOLIC PANEL - Abnormal; Notable for the following:    Sodium 133 (*)    Chloride 99 (*)    CO2 21 (*)    Glucose, Bld 299 (*)    BUN 28 (*)    Creatinine, Ser 1.80 (*)    GFR calc non Af Amer 43 (*)    GFR calc Af Amer 50 (*)    All other components within normal limits  URINALYSIS, ROUTINE W REFLEX MICROSCOPIC (NOT AT [facility])  URINALYSIS, ROUTINE W REFLEX MICROSCOPIC (NOT AT [facility])  URINE MICROSCOPIC-ADD ON    Imaging Review Ct Abdomen Pelvis Wo Contrast  07/08/2015   CLINICAL DATA:  Left flank pain and dysuria for 2 days.  EXAM: CT ABDOMEN AND PELVIS WITHOUT CONTRAST  TECHNIQUE: Multidetector CT imaging of the abdomen and pelvis was performed following the standard protocol without IV contrast.  COMPARISON:  None.  FINDINGS: Lower chest: No acute findings.  Hepatobiliary: No mass visualized on this  unenhanced exam. Gallbladder is unremarkable.  Pancreas: No mass or inflammatory process visualized on this unenhanced exam.  Spleen:  Within normal limits in size.  Adrenal Glands:  No masses identified.  Kidneys/Urinary tract: No evidence of urolithiasis or hydronephrosis.  Stomach/Bowel/Peritoneum: Diverticulosis is seen involving the ascending and sigmoid colon. No evidence of diverticulitis or other inflammatory process. Normal appendix is visualized.  Vascular/Lymphatic: No pathologically enlarged lymph nodes identified. No abdominal aortic aneurysm or other significant retroperitoneal abnormality demonstrated.  Reproductive:  No mass or other significant abnormality noted.  Other:  None.  Musculoskeletal: No suspicious bone lesions identified. Moderate degenerative lumbar spondylosis noted.  IMPRESSION: No evidence of urolithiasis, hydronephrosis, or other acute findings.  Colonic diverticulosis. No radiographic evidence of diverticulitis.   Electronically Signed   By: Myles Rosenthal M.D.   On: 07/08/2015 21:41   US Scrotum  07/08/2015   CLINICAL  DATA:  44 year old male with testicular pain.  EXAM: ULTRASOUND OF SCROTUM  TECHNIQUE: Complete ultrasound examination of the testicles, epididymis, and other scrotal structures was performed.  COMPARISON:  None.  FINDINGS: Right testicle  Measurements: 4.9 x 2.6 x 3.5 cm. No mass or microlithiasis visualized.  Left testicle  Measurements: 5.0 x 2.6 x 3.5 cm. No mass or microlithiasis visualized.  Right epididymis:  Normal in size and appearance.  Left epididymis:  Normal in size and appearance.  Hydrocele:  Small bilateral hydroceles.  Varicocele:  Left-sided varicoceles noted.  IMPRESSION: Left-sided varicoceles otherwise unremarkable testicular ultrasound.   Electronically Signed   By: Elgie CollardArash  Radparvar M.D.   On: 07/08/2015 17:32     EKG Interpretation None      MDM   Final diagnoses:  Left varicocele  Testicular pain, left  Left flank pain    Non-toxic appearing, NAD. AFVSS. Scrotal US ordered by triage, prior to pt being seen while in waiting room, results as stated above. I doubt this is the cause of pt's testicular pain. Pain radiating around abdomen, flank and back, cannot exclude kidney stone. No hernia palpated. Will obtain CT without contrast. UA pending.  CT without acute findings. UA negative. Labs not crossing over due to lab issue, reviewed on print-outs. Slight elevation of BUN/Cr. No other acute findings. No acute abdomen. Rectal exam without evidence of prostatitis. Pain may be coming from varicocele. Advised f/u with PCP and urology. Stable for d/c. Rx vicodin for pain. Return precautions given. Patient states understanding of treatment care plan and is agreeable.  Discussed with attending Dr. Littie DeedsGentry who agrees with plan of care.  Kathrynn SpeedRobyn M Tashae Inda, PA-C 07/08/15 2348  Mirian MoMatthew Gentry, MD 07/09/15 [phone]

## 2015-07-08 NOTE — ED Notes (Signed)
The pt is c/o an inability to void for 3 days.  Voiding in small amounts.  He volided here.   See the pas notes for further she saw before myself

## 2015-07-09 LAB — URINALYSIS, ROUTINE W REFLEX MICROSCOPIC
Bilirubin Urine: NEGATIVE
Glucose, UA: >1000 mg/dL — AB
Hgb urine dipstick: NEGATIVE
KETONES UR: NEGATIVE mg/dL
LEUKOCYTES UA: NEGATIVE
NITRITE: NEGATIVE
PROTEIN: NEGATIVE mg/dL
SPECIFIC GRAVITY, URINE: 1.03 (ref 1.005–1.030)
Urobilinogen, UA: 0.2 mg/dL (ref 0.0–1.0)
pH: 5.5 (ref 5.0–8.0)

## 2015-07-09 LAB — URINE MICROSCOPIC-ADD ON

## 2015-07-16 ENCOUNTER — Other Ambulatory Visit: Payer: Self-pay | Admitting: Infectious Diseases

## 2015-08-16 ENCOUNTER — Other Ambulatory Visit: Payer: Self-pay | Admitting: Infectious Diseases

## 2015-08-23 ENCOUNTER — Encounter: Payer: Self-pay | Admitting: Infectious Diseases

## 2015-08-23 ENCOUNTER — Ambulatory Visit (INDEPENDENT_AMBULATORY_CARE_PROVIDER_SITE_OTHER): Payer: Self-pay | Admitting: Infectious Diseases

## 2015-08-23 ENCOUNTER — Other Ambulatory Visit: Payer: Self-pay | Admitting: *Deleted

## 2015-08-23 VITALS — BP 135/85 | HR 79 | Temp 98.3°F | Ht 73.0 in | Wt 235.0 lb

## 2015-08-23 DIAGNOSIS — IMO0002 Reserved for concepts with insufficient information to code with codable children: Secondary | ICD-10-CM

## 2015-08-23 DIAGNOSIS — E11621 Type 2 diabetes mellitus with foot ulcer: Secondary | ICD-10-CM | POA: Insufficient documentation

## 2015-08-23 DIAGNOSIS — B20 Human immunodeficiency virus [HIV] disease: Secondary | ICD-10-CM

## 2015-08-23 DIAGNOSIS — E785 Hyperlipidemia, unspecified: Secondary | ICD-10-CM

## 2015-08-23 DIAGNOSIS — Z23 Encounter for immunization: Secondary | ICD-10-CM

## 2015-08-23 DIAGNOSIS — E1165 Type 2 diabetes mellitus with hyperglycemia: Secondary | ICD-10-CM

## 2015-08-23 DIAGNOSIS — E118 Type 2 diabetes mellitus with unspecified complications: Secondary | ICD-10-CM

## 2015-08-23 DIAGNOSIS — E08621 Diabetes mellitus due to underlying condition with foot ulcer: Secondary | ICD-10-CM

## 2015-08-23 DIAGNOSIS — L97509 Non-pressure chronic ulcer of other part of unspecified foot with unspecified severity: Secondary | ICD-10-CM | POA: Insufficient documentation

## 2015-08-23 DIAGNOSIS — Z113 Encounter for screening for infections with a predominantly sexual mode of transmission: Secondary | ICD-10-CM

## 2015-08-23 MED ORDER — EMTRICITAB-RILPIVIR-TENOFOV AF 200-25-25 MG PO TABS: 1.0000 | ORAL_TABLET | Freq: Every day | ORAL | Status: DC

## 2015-08-23 MED ORDER — CYANOCOBALAMIN 1000 MCG/ML IJ SOLN: 1000.0000 ug | Freq: Once | INTRAMUSCULAR | Status: AC

## 2015-08-23 NOTE — Assessment & Plan Note (Signed)
He gets flu shot today.  Will change him to odefsy to see if we can affect lipids, help his kidneys long term. Meet with ADAP coordinator Will see him back in 4 months.  Will get him into dental.  Will offer condoms.

## 2015-08-23 NOTE — Progress Notes (Signed)
   Subjective:    Patient ID: Harold Crosby, male    DOB: 1971-01-16, 44 y.o.   MRN: [id number]  HPI 44 yo M with hx of HIV+, DM2 on insulin, hyperlipidemia.  Has been doing well with atripla. Has had f/u with his PCP- his DM has been "terrible". FSG have been ~ 300. Has foot ulcer as well. Erythema now resolved, no proximal streaking. Was started on antifungal and anbx (he can't remember name).  Also started on prostate rx, can't remember name of that.  Has been out of atripla a few days ago, needs to renew adap.   FHx- mother deceased from renal disease.   Review of Systems  Constitutional: Negative for fever and chills.  Respiratory: Negative for cough and shortness of breath.   Cardiovascular: Positive for leg swelling. Negative for chest pain.  Gastrointestinal: Negative for diarrhea and constipation.  Genitourinary: Negative for difficulty urinating.  Neurological: Negative for numbness.  has not had ophtho this year. Vision has been fine.      Objective:   Physical Exam  Constitutional: He appears well-developed and well-nourished.  HENT:  Mouth/Throat: No oropharyngeal exudate.  Eyes: EOM are normal. Pupils are equal, round, and reactive to light.  Neck: Neck supple.  Cardiovascular: Normal rate, regular rhythm and normal heart sounds.   Pulmonary/Chest: Effort normal and breath sounds normal.  Abdominal: Soft. Bowel sounds are normal. There is no tenderness. There is no rebound.  Musculoskeletal: He exhibits no edema.       Feet:  Lymphadenopathy:    He has no cervical adenopathy.          Assessment & Plan:

## 2015-08-23 NOTE — Assessment & Plan Note (Signed)
Will f/u with pcp.  Consider higher dose or change to more high potency statin.

## 2015-08-23 NOTE — Assessment & Plan Note (Signed)
He is doing poorly. He will continue to f/u with his PCP.

## 2015-08-23 NOTE — Assessment & Plan Note (Signed)
Have asked him to call back in 3-4 weeks if not improved on his current anbx.  If not improving, will need ortho, MRI, possible IV anbx.  Home health visit

## 2015-08-23 NOTE — Addendum Note (Signed)
Addended by: Wendall Mola A on: 08/23/2015 03:59 PM   Modules accepted: Orders

## 2015-10-16 ENCOUNTER — Other Ambulatory Visit: Payer: Self-pay | Admitting: Infectious Diseases

## 2015-11-21 ENCOUNTER — Encounter (HOSPITAL_COMMUNITY): Payer: Self-pay | Admitting: *Deleted

## 2015-11-21 ENCOUNTER — Emergency Department (HOSPITAL_COMMUNITY): Payer: Self-pay

## 2015-11-21 ENCOUNTER — Inpatient Hospital Stay (HOSPITAL_COMMUNITY)
Admission: EM | Admit: 2015-11-21 | Discharge: 2015-11-24 | DRG: 975 | Disposition: A | Discharge status: Home / Self Care | Payer: Self-pay | Attending: Oncology | Admitting: Oncology

## 2015-11-21 DIAGNOSIS — Z21 Asymptomatic human immunodeficiency virus [HIV] infection status: Secondary | ICD-10-CM | POA: Diagnosis present

## 2015-11-21 DIAGNOSIS — Z882 Allergy status to sulfonamides status: Secondary | ICD-10-CM

## 2015-11-21 DIAGNOSIS — E785 Hyperlipidemia, unspecified: Secondary | ICD-10-CM | POA: Diagnosis present

## 2015-11-21 DIAGNOSIS — E119 Type 2 diabetes mellitus without complications: Secondary | ICD-10-CM | POA: Diagnosis present

## 2015-11-21 DIAGNOSIS — E1165 Type 2 diabetes mellitus with hyperglycemia: Secondary | ICD-10-CM | POA: Diagnosis present

## 2015-11-21 DIAGNOSIS — E1110 Type 2 diabetes mellitus with ketoacidosis without coma: Secondary | ICD-10-CM

## 2015-11-21 DIAGNOSIS — B952 Enterococcus as the cause of diseases classified elsewhere: Secondary | ICD-10-CM | POA: Diagnosis present

## 2015-11-21 DIAGNOSIS — K610 Anal abscess: Secondary | ICD-10-CM | POA: Diagnosis present

## 2015-11-21 DIAGNOSIS — E1122 Type 2 diabetes mellitus with diabetic chronic kidney disease: Secondary | ICD-10-CM | POA: Diagnosis present

## 2015-11-21 DIAGNOSIS — L0291 Cutaneous abscess, unspecified: Secondary | ICD-10-CM | POA: Insufficient documentation

## 2015-11-21 DIAGNOSIS — L0390 Cellulitis, unspecified: Secondary | ICD-10-CM

## 2015-11-21 DIAGNOSIS — F17200 Nicotine dependence, unspecified, uncomplicated: Secondary | ICD-10-CM | POA: Diagnosis present

## 2015-11-21 DIAGNOSIS — K760 Fatty (change of) liver, not elsewhere classified: Secondary | ICD-10-CM | POA: Diagnosis present

## 2015-11-21 DIAGNOSIS — Z79899 Other long term (current) drug therapy: Secondary | ICD-10-CM

## 2015-11-21 DIAGNOSIS — I10 Essential (primary) hypertension: Secondary | ICD-10-CM | POA: Diagnosis present

## 2015-11-21 DIAGNOSIS — K612 Anorectal abscess: Secondary | ICD-10-CM | POA: Diagnosis present

## 2015-11-21 DIAGNOSIS — Z794 Long term (current) use of insulin: Secondary | ICD-10-CM

## 2015-11-21 DIAGNOSIS — G4700 Insomnia, unspecified: Secondary | ICD-10-CM | POA: Diagnosis present

## 2015-11-21 DIAGNOSIS — E118 Type 2 diabetes mellitus with unspecified complications: Secondary | ICD-10-CM

## 2015-11-21 DIAGNOSIS — B20 Human immunodeficiency virus [HIV] disease: Secondary | ICD-10-CM | POA: Diagnosis present

## 2015-11-21 DIAGNOSIS — Z7982 Long term (current) use of aspirin: Secondary | ICD-10-CM

## 2015-11-21 DIAGNOSIS — A419 Sepsis, unspecified organism: Principal | ICD-10-CM | POA: Diagnosis present

## 2015-11-21 DIAGNOSIS — F17210 Nicotine dependence, cigarettes, uncomplicated: Secondary | ICD-10-CM | POA: Diagnosis present

## 2015-11-21 DIAGNOSIS — N179 Acute kidney failure, unspecified: Secondary | ICD-10-CM | POA: Diagnosis present

## 2015-11-21 LAB — CBC WITH DIFFERENTIAL/PLATELET
Basophils Absolute: 0 10*3/uL (ref 0.0–0.1)
Basophils Relative: 0 %
EOS ABS: 0 10*3/uL (ref 0.0–0.7)
Eosinophils Relative: 0 %
HCT: 36.6 % — ABNORMAL LOW (ref 39.0–52.0)
Hemoglobin: 12.7 g/dL — ABNORMAL LOW (ref 13.0–17.0)
LYMPHS ABS: 0.8 10*3/uL (ref 0.7–4.0)
Lymphocytes Relative: 7 %
MCH: 34.9 pg — AB (ref 26.0–34.0)
MCHC: 34.7 g/dL (ref 30.0–36.0)
MCV: 100.5 fL — ABNORMAL HIGH (ref 78.0–100.0)
MONO ABS: 0.7 10*3/uL (ref 0.1–1.0)
MONOS PCT: 6 %
Neutro Abs: 9.9 10*3/uL — ABNORMAL HIGH (ref 1.7–7.7)
Neutrophils Relative %: 87 %
Platelets: 221 10*3/uL (ref 150–400)
RBC: 3.64 MIL/uL — ABNORMAL LOW (ref 4.22–5.81)
RDW: 12.3 % (ref 11.5–15.5)
WBC: 11.5 10*3/uL — ABNORMAL HIGH (ref 4.0–10.5)

## 2015-11-21 LAB — COMPREHENSIVE METABOLIC PANEL
ALK PHOS: 74 U/L (ref 38–126)
ALT: 17 U/L (ref 17–63)
AST: 15 U/L (ref 15–41)
Albumin: 2.9 g/dL — ABNORMAL LOW (ref 3.5–5.0)
Anion gap: 15 (ref 5–15)
BILIRUBIN TOTAL: 0.4 mg/dL (ref 0.3–1.2)
BUN: 25 mg/dL — AB (ref 6–20)
CALCIUM: 9.2 mg/dL (ref 8.9–10.3)
CO2: 19 mmol/L — ABNORMAL LOW (ref 22–32)
CREATININE: 1.76 mg/dL — AB (ref 0.61–1.24)
Chloride: 93 mmol/L — ABNORMAL LOW (ref 101–111)
GFR calc Af Amer: 52 mL/min — ABNORMAL LOW (ref >60)
GFR, EST NON AFRICAN AMERICAN: 45 mL/min — AB (ref >60)
Glucose, Bld: 401 mg/dL — ABNORMAL HIGH (ref 65–99)
POTASSIUM: 4.7 mmol/L (ref 3.5–5.1)
Sodium: 127 mmol/L — ABNORMAL LOW (ref 135–145)
TOTAL PROTEIN: 6.6 g/dL (ref 6.5–8.1)

## 2015-11-21 LAB — CREATININE, SERUM
CREATININE: 1.57 mg/dL — AB (ref 0.61–1.24)
GFR calc non Af Amer: 51 mL/min — ABNORMAL LOW (ref >60)
GFR, EST AFRICAN AMERICAN: 59 mL/min — AB (ref >60)

## 2015-11-21 LAB — CBC
HEMATOCRIT: 33.8 % — AB (ref 39.0–52.0)
Hemoglobin: 11.7 g/dL — ABNORMAL LOW (ref 13.0–17.0)
MCH: 34.7 pg — ABNORMAL HIGH (ref 26.0–34.0)
MCHC: 34.6 g/dL (ref 30.0–36.0)
MCV: 100.3 fL — ABNORMAL HIGH (ref 78.0–100.0)
PLATELETS: 199 10*3/uL (ref 150–400)
RBC: 3.37 MIL/uL — ABNORMAL LOW (ref 4.22–5.81)
RDW: 12.3 % (ref 11.5–15.5)
WBC: 9.6 10*3/uL (ref 4.0–10.5)

## 2015-11-21 LAB — I-STAT CG4 LACTIC ACID, ED
LACTIC ACID, VENOUS: 1.79 mmol/L (ref 0.5–2.0)
Lactic Acid, Venous: 3.75 mmol/L (ref 0.5–2.0)

## 2015-11-21 LAB — URINALYSIS, ROUTINE W REFLEX MICROSCOPIC
BILIRUBIN URINE: NEGATIVE
Glucose, UA: 250 mg/dL — AB
HGB URINE DIPSTICK: NEGATIVE
Ketones, ur: NEGATIVE mg/dL
Leukocytes, UA: NEGATIVE
Nitrite: NEGATIVE
PROTEIN: NEGATIVE mg/dL
Specific Gravity, Urine: 1.017 (ref 1.005–1.030)
pH: 5.5 (ref 5.0–8.0)

## 2015-11-21 LAB — GLUCOSE, CAPILLARY
GLUCOSE-CAPILLARY: 369 mg/dL — AB (ref 65–99)
Glucose-Capillary: 214 mg/dL — ABNORMAL HIGH (ref 65–99)

## 2015-11-21 MED ORDER — ENOXAPARIN SODIUM 40 MG/0.4ML ~~LOC~~ SOLN
40.0000 mg | SUBCUTANEOUS | Status: DC
  Administered 2015-11-21 – 2015-11-22 (×2): 40 mg via SUBCUTANEOUS
  Filled 2015-11-21 (×2): qty 0.4

## 2015-11-21 MED ORDER — EMTRICITABINE-TENOFOVIR AF 200-25 MG PO TABS
1.0000 | ORAL_TABLET | Freq: Every day | ORAL | Status: DC
  Administered 2015-11-22 – 2015-11-24 (×3): 1 via ORAL
  Filled 2015-11-21 (×5): qty 1

## 2015-11-21 MED ORDER — INSULIN GLARGINE 100 UNIT/ML ~~LOC~~ SOLN
30.0000 [IU] | Freq: Two times a day (BID) | SUBCUTANEOUS | Status: DC
  Administered 2015-11-21: 30 [IU] via SUBCUTANEOUS
  Filled 2015-11-21 (×4): qty 0.3

## 2015-11-21 MED ORDER — ACETAMINOPHEN 500 MG PO TABS
500.0000 mg | ORAL_TABLET | Freq: Four times a day (QID) | ORAL | Status: DC | PRN
  Administered 2015-11-21: 500 mg via ORAL
  Filled 2015-11-21: qty 1

## 2015-11-21 MED ORDER — EMTRICITAB-RILPIVIR-TENOFOV AF 200-25-25 MG PO TABS: 1.0000 | ORAL_TABLET | Freq: Every day | ORAL | Status: DC

## 2015-11-21 MED ORDER — RILPIVIRINE HCL 25 MG PO TABS
25.0000 mg | ORAL_TABLET | Freq: Every day | ORAL | Status: DC
  Administered 2015-11-22 – 2015-11-24 (×3): 25 mg via ORAL
  Filled 2015-11-21 (×5): qty 1

## 2015-11-21 MED ORDER — VANCOMYCIN HCL 10 G IV SOLR
1500.0000 mg | Freq: Once | INTRAVENOUS | Status: AC
  Administered 2015-11-21: 1500 mg via INTRAVENOUS
  Filled 2015-11-21: qty 1500

## 2015-11-21 MED ORDER — ASPIRIN EC 81 MG PO TBEC
81.0000 mg | DELAYED_RELEASE_TABLET | Freq: Every day | ORAL | Status: DC
  Administered 2015-11-22 – 2015-11-24 (×3): 81 mg via ORAL
  Filled 2015-11-21 (×4): qty 1

## 2015-11-21 MED ORDER — ASPIRIN 81 MG PO TABS: 81.0000 mg | ORAL_TABLET | Freq: Every day | ORAL | Status: DC

## 2015-11-21 MED ORDER — PRAVASTATIN SODIUM 40 MG PO TABS
40.0000 mg | ORAL_TABLET | Freq: Every day | ORAL | Status: DC
  Administered 2015-11-22 – 2015-11-23 (×2): 40 mg via ORAL
  Filled 2015-11-21 (×2): qty 1

## 2015-11-21 MED ORDER — INSULIN ASPART 100 UNIT/ML ~~LOC~~ SOLN
0.0000 [IU] | Freq: Three times a day (TID) | SUBCUTANEOUS | Status: DC
  Administered 2015-11-22 (×2): 15 [IU] via SUBCUTANEOUS
  Administered 2015-11-22: 8 [IU] via SUBCUTANEOUS
  Administered 2015-11-23: 11 [IU] via SUBCUTANEOUS
  Administered 2015-11-23: 8 [IU] via SUBCUTANEOUS
  Administered 2015-11-23: 5 [IU] via SUBCUTANEOUS
  Administered 2015-11-24: 11 [IU] via SUBCUTANEOUS
  Administered 2015-11-24: 8 [IU] via SUBCUTANEOUS

## 2015-11-21 MED ORDER — VANCOMYCIN HCL IN DEXTROSE 750-5 MG/150ML-% IV SOLN
750.0000 mg | Freq: Two times a day (BID) | INTRAVENOUS | Status: DC
  Administered 2015-11-22 – 2015-11-23 (×4): 750 mg via INTRAVENOUS
  Filled 2015-11-21 (×5): qty 150

## 2015-11-21 MED ORDER — SODIUM CHLORIDE 0.9 % IV BOLUS (SEPSIS)
3000.0000 mL | Freq: Once | INTRAVENOUS | Status: AC
  Administered 2015-11-21: 3000 mL via INTRAVENOUS

## 2015-11-21 MED ORDER — IOHEXOL 300 MG/ML  SOLN
80.0000 mL | Freq: Once | INTRAMUSCULAR | Status: AC | PRN
  Administered 2015-11-21: 80 mL via INTRAVENOUS

## 2015-11-21 MED ORDER — INSULIN ASPART 100 UNIT/ML ~~LOC~~ SOLN
0.0000 [IU] | Freq: Every day | SUBCUTANEOUS | Status: DC
  Administered 2015-11-21 – 2015-11-22 (×2): 5 [IU] via SUBCUTANEOUS
  Administered 2015-11-23: 4 [IU] via SUBCUTANEOUS

## 2015-11-21 MED ORDER — PROMETHAZINE HCL 25 MG/ML IJ SOLN
12.5000 mg | Freq: Four times a day (QID) | INTRAMUSCULAR | Status: DC | PRN
  Administered 2015-11-21: 12.5 mg via INTRAVENOUS
  Filled 2015-11-21: qty 1

## 2015-11-21 MED ORDER — SODIUM CHLORIDE 0.9 % IV SOLN
INTRAVENOUS | Status: AC
  Administered 2015-11-21: 19:00:00 via INTRAVENOUS

## 2015-11-21 MED ORDER — INSULIN ASPART 100 UNIT/ML ~~LOC~~ SOLN
0.0000 [IU] | Freq: Three times a day (TID) | SUBCUTANEOUS | Status: DC
  Administered 2015-11-21: 3 [IU] via SUBCUTANEOUS

## 2015-11-21 MED ORDER — PIPERACILLIN-TAZOBACTAM 3.375 G IVPB 30 MIN
3.3750 g | Freq: Once | INTRAVENOUS | Status: AC
  Administered 2015-11-21: 3.375 g via INTRAVENOUS
  Filled 2015-11-21: qty 50

## 2015-11-21 NOTE — ED Notes (Signed)
Attempted report 

## 2015-11-21 NOTE — ED Provider Notes (Signed)
CSN: [id number]     Arrival date & time 11/21/15  1226 History   First MD Initiated Contact with Patient 11/21/15 1325     Chief Complaint  Patient presents with  . Groin Swelling  . Cellulitis     (Consider location/radiation/quality/duration/timing/severity/associated sxs/prior Treatment) HPI   Patient is a 44 year old man with history of HIV and poorly controlled DM who presents with groin abscess. Abscess is located beneath his scrotum. Patient reports that he first noticed the abscess about a week ago, and presented to his PCP 5 days ago where he underwent and I&D. He was sent home with bactrim, however was not able to take it due to his sulfa drug allergy. Three days ago the patient was started on keflex. Patient presented to the ED today due to abscess not improving. Patient accidentally removed the packing, however states that the abscess has continued to drain. He reports fevers up to 102F a few days ago, which improved after starting the keflex. Also reports intermittent nausea and vomiting.   Past Medical History  Diagnosis Date  . HIV (human immunodeficiency virus infection) (HCC)     Followed by Dr. Ninetta LightsHatcher in ID clinic.   . Type II diabetes mellitus (HCC)   . Hyperlipidemia   . Tobacco abuse   . Hypertension   . History of thrush   . Insomnia    History reviewed. No pertinent past surgical history. Family History  Problem Relation Age of Onset  . Coronary artery disease Other   . Diabetes Other   . Kidney failure Mother    Social History  Substance Use Topics  . Smoking status: Current Every Day Smoker -- 1.00 packs/day for 20 years    Types: Cigarettes  . Smokeless tobacco: Never Used     Comment: trying to quit  . Alcohol Use: No    Review of Systems  Constitutional: Positive for fever and chills.  HENT: Negative.   Eyes: Negative.   Respiratory: Negative.   Cardiovascular: Negative.   Gastrointestinal: Positive for nausea, vomiting and diarrhea.  Negative for abdominal pain.  Genitourinary: Positive for scrotal swelling.  Musculoskeletal: Negative.   Skin: Positive for color change and rash.  Allergic/Immunologic: Positive for immunocompromised state.  Neurological: Negative.   Hematological: Negative.   Psychiatric/Behavioral: Negative.       Allergies  Sulfonamide derivatives  Home Medications   Prior to Admission medications   Medication Sig Start Date End Date Taking? Authorizing Provider  aspirin 81 MG tablet Take 81 mg by mouth daily.     Yes Historical Provider, MD  emtricitabine-rilpivir-tenofovir AF (ODEFSEY) 200-25-25 MG TABS per tablet Take 1 tablet by mouth daily with breakfast. 08/23/15  Yes Ginnie SmartJeffrey C Hatcher, MD  glipiZIDE (GLUCOTROL) 10 MG tablet Take 1 tablet (10 mg total) by mouth 2 (two) times daily before a meal. 06/04/12  Yes Darnelle MaffucciPatrick Tobbia, MD  insulin glargine (LANTUS) 100 UNIT/ML injection Inject 55 Units into the skin at bedtime. Patient taking differently: Inject 60 Units into the skin 2 (two) times daily.  03/08/12  Yes Darnelle MaffucciPatrick Tobbia, MD  lisinopril-hydrochlorothiazide (PRINZIDE,ZESTORETIC) 20-25 MG tablet TAKE 1 TABLET BY MOUTH DAILY 10/18/15  Yes Ginnie SmartJeffrey C Hatcher, MD  metFORMIN (GLUCOPHAGE) 1000 MG tablet Take 1 tablet (1,000 mg total) by mouth 2 (two) times daily with a meal. 07/19/12  Yes Margarito LinerJerry Joines, MD  pravastatin (PRAVACHOL) 40 MG tablet Take 1 tablet (40 mg total) by mouth daily. 04/04/12  Yes Darnelle MaffucciPatrick Tobbia, MD   BP  115/81 mmHg  Pulse 89  Temp(Src) 97.8 F (36.6 C) (Oral)  Resp 18  Ht  (1.854 m)  Wt 106.595 kg  BMI 31.01 kg/m2  SpO2 99% Physical Exam  Constitutional: He is oriented to person, place, and time. He appears well-developed and well-nourished.  HENT:  Head: Normocephalic and atraumatic.  Eyes: EOM are normal. Pupils are equal, round, and reactive to light.  Neck: Normal range of motion. Neck supple.  Cardiovascular: Normal rate, regular rhythm and normal heart  sounds.   No murmur heard. Pulmonary/Chest: Effort normal and breath sounds normal.  Abdominal: Soft. Bowel sounds are normal. He exhibits no distension. There is no tenderness.  Genitourinary:  Patient with erythematous and edematous scrotum. Erythema spreads from perianal region to suprapubic region. Approximately 5cm of induration noted in left inguinal crease. A separate 4-5cm area of induration with open wound noted at left perianal area with purulent drainage noted.   Musculoskeletal: Normal range of motion. He exhibits no edema or tenderness.  Neurological: He is alert and oriented to person, place, and time. No cranial nerve deficit.  Skin: Skin is warm and dry.  Rash as noted above  Psychiatric: He has a normal mood and affect. His behavior is normal.  Nursing note and vitals reviewed.   ED Course  Procedures (including critical care time) Labs Review Labs Reviewed  COMPREHENSIVE METABOLIC PANEL - Abnormal; Notable for the following:    Sodium 127 (*)    Chloride 93 (*)    CO2 19 (*)    Glucose, Bld 401 (*)    BUN 25 (*)    Creatinine, Ser 1.76 (*)    Albumin 2.9 (*)    GFR calc non Af Amer 45 (*)    GFR calc Af Amer 52 (*)    All other components within normal limits  URINALYSIS, ROUTINE W REFLEX MICROSCOPIC (NOT AT [facility]) - Abnormal; Notable for the following:    Glucose, UA 250 (*)    All other components within normal limits  CBC WITH DIFFERENTIAL/PLATELET - Abnormal; Notable for the following:    WBC 11.5 (*)    RBC 3.64 (*)    Hemoglobin 12.7 (*)    HCT 36.6 (*)    MCV 100.5 (*)    MCH 34.9 (*)    Neutro Abs 9.9 (*)    All other components within normal limits  I-STAT CG4 LACTIC ACID, ED - Abnormal; Notable for the following:    Lactic Acid, Venous 3.75 (*)    All other components within normal limits  CULTURE, BLOOD (ROUTINE X 2)  URINE CULTURE  CULTURE, BLOOD (ROUTINE X 2)  I-STAT CG4 LACTIC ACID, ED    Imaging Review Ct Abdomen Pelvis W  Contrast  11/21/2015  CLINICAL DATA:  Groin swelling, perianal abscess, incision and drainage 6 days ago EXAM: CT ABDOMEN AND PELVIS WITH CONTRAST TECHNIQUE: Multidetector CT imaging of the abdomen and pelvis was performed using the standard protocol following bolus administration of intravenous contrast. CONTRAST:  100 ml OMNIPAQUE IOHEXOL 300 MG/ML  SOLN COMPARISON:  07/08/2015 FINDINGS: Lower chest:  Normal Hepatobiliary: Diffuse mild to moderate hepatic steatosis. Otherwise negative. Pancreas: Normal Spleen: Normal Adrenals/Urinary Tract: Fullness of the left adrenal gland with mild nodularity similar to prior study. Bilateral moderate perinephric inflammatory change. This is also similar to the prior study and likely chronic. No hydronephrosis. Bladder negative. Stomach/Bowel: Mild diverticulosis throughout the large bowel. Nonobstructive bowel gas pattern. Normal appendix. Vascular/Lymphatic: Mild atherosclerotic calcification of the aortoiliac vessels.  No significant adenopathy. Reproductive: Negative Other: There is air in the perineum extending into the left inguinal region. This process is not completely visualized, extending off the inferior edge of the scan volume. There is inflammatory change accompanying this. Musculoskeletal: No acute osseous abnormalities IMPRESSION: Soft tissue air extending from the perianal region through the left perineum into the left inguinal region with accompanying inflammation. The full caudal extent of the process is not included on the images. Air may indicate soft tissue infection. There may also be related to recent instrumentation with history of incision and drainage. CT follow-up suggested, with attention to imaging the full extent of the process. Electronically Signed   By: Esperanza Heir M.D.   On: 11/21/2015 15:53   I have personally reviewed and evaluated these images and lab results as part of my medical decision-making.   EKG Interpretation None       MDM   Final diagnoses:  Cellulitis, unspecified cellulitis site, unspecified extremity site, unspecified laterality  Abscess   Patient is a 44 year old man with history of HIV and poorly controlled DM who presenting with perineum abscess. Labs notable for elevated lactate to 3.75 and WBC to 11.5. Blood cultures obtained. Patient started on vancomycin and zosyn. Vitals improving after 30cc/kg bolus. Discussed case with IM teaching service. Will see and admit patient.    Ardith Dark, MD 11/21/15 1619  Gerhard Munch, MD 11/27/15 [phone]

## 2015-11-21 NOTE — ED Notes (Signed)
Patient transported to CT 

## 2015-11-21 NOTE — ED Notes (Signed)
Patient returned from CT

## 2015-11-21 NOTE — ED Notes (Signed)
Pt in c/o Groin swelling d/t abcess, pt reports seeing PCP & having I&D x 6 days ago, pt started taking keflex x 2 days ago, pt reports that the packing was removed x 6 days ago by accident when he was moving the bathroom, pt reports fever & chills, pt A&O x4

## 2015-11-21 NOTE — Progress Notes (Signed)
Pt admitted from ED this evening with c/o groin abscess. Alert, oriented and able to voice needs. Reports 6/10 but does not need pain medication.

## 2015-11-21 NOTE — Progress Notes (Addendum)
ANTIBIOTIC CONSULT NOTE - INITIAL  Pharmacy Consult for vancomycin Indication: cellulitis  Allergies  Allergen Reactions  . Sulfonamide Derivatives     REACTION: skin welps, tongue swells, throat closes up    Patient Measurements: Height: 6\' 1"  (185.4 cm) Weight: 235 lb (106.595 kg) IBW/kg (Calculated) : 79.9   Vital Signs: Temp: 97.8 F (36.6 C) (11/27 1258) Temp Source: Oral (11/27 1258) BP: 105/73 mmHg (11/27 1346) Pulse Rate: 88 (11/27 1346) Intake/Output from previous day:   Intake/Output from this shift:    Labs:  Recent Labs  11/21/15 1310  WBC 11.5*  HGB 12.7*  PLT 221   CrCl cannot be calculated (Patient has no serum creatinine result on file.). No results for input(s): VANCOTROUGH, VANCOPEAK, VANCORANDOM, GENTTROUGH, GENTPEAK, GENTRANDOM, TOBRATROUGH, TOBRAPEAK, TOBRARND, AMIKACINPEAK, AMIKACINTROU, AMIKACIN in the last 72 hours.   Microbiology: No results found for this or any previous visit (from the past 720 hour(s)).  Medical History: Past Medical History  Diagnosis Date  . HIV (human immunodeficiency virus infection) (HCC)     Followed by Dr. Ninetta LightsHatcher in ID clinic.   . Type II diabetes mellitus (HCC)   . Hyperlipidemia   . Tobacco abuse   . Hypertension   . History of thrush   . Insomnia    Assessment: [age] YOM with HIV s/p I&D of groin abscess 5 days ago. Was given Bactrim as outpt, but d/t sulfa allergy he did not take. Started on keflex 3 days ago. Abscess not improving and he presents to ED today. To start IV vancomycin for cellulitis.  Also has 1 time order for Zosyn entered.  SCr 1.76, CrCl ~60-3470mL/min. WBC 11.5, afeb.  Goal of Therapy:  Vancomycin trough level 10-15 mcg/ml  Plan:  -vancomycin 1500mg  IV x1 as loading dose, then 750mg  IV q12h as maintenance -f/u c/s, clinical progression, renal function -follow for any other antibiotic orders  Treesa Mccully D. Noemie Devivo, PharmD, BCPS Clinical Pharmacist Pager: [phone] 11/21/2015 2:11  PM

## 2015-11-21 NOTE — H&P (Signed)
Date: 11/21/2015               Patient Name:  Harold Crosby MRN: [id number]  DOB: 1971/01/25 Age / Sex: 44 y.o., male   PCP: Pcp Not In System         Medical Service: Internal Medicine Teaching Service         Attending Physician: Dr. Levert FeinsteinJames M Granfortuna, MD    First Contact: Dr. Loney Lohathore Pager: [phone]  Second Contact: Dr. Tasia CatchingsAhmed Pager: [phone]       After Hours (After 5p/  First Contact Pager: [phone]  weekends / holidays): Second Contact Pager: [phone]   Chief Complaint: abscess in buttock area, fever, nausea, vomiting    History of Present Illness: Patient is a 44 year old Caucasian male with a past medical history of HIV, DM 2, hypertension, hyperlipidemia [facility] with the chief complaint of "boils." States he first noticed this "boil" in his buttock/ anal region about 1 week ago and went to his PCP on Wednesday (11/17/15). States his PCP performed an I&D that day and 2 days later he was prescribed an antibiotic (Keflex). Also reports having associated pain in the area, fevers, nausea, and vomiting since Wednesday night. States the "boil" got bigger in size after the I&D procedure and now has to spread from his buttock region to the groin. Denies having pain with defecation, blood per rectum, or dysuria. Denies shaving the area or any history of cuts in the area. Patient does report having a two-year history of frequent "boils" which resolve spontaneously within a month, most recent 1.5 weeks ago on his right lower back. States he normally "pops them with a pen" but did not do that for the buttock/ groin area.   Patient reports being compliant his HIV medications and following up with Dr. Ninetta LightsHatcher regularly.   Meds: Current Facility-Administered Medications  Medication Dose Route Frequency Provider Last Rate Last Dose  . 0.9 %  sodium chloride infusion   Intravenous Continuous Tasrif Ahmed, MD      . aspirin tablet 81 mg  81 mg Oral Daily Tasrif Ahmed, MD       . Melene Muller[START ON 11/22/2015] emtricitabine-rilpivir-tenofovir AF (ODEFSEY) 200-25-25 MG per tablet 1 tablet  1 tablet Oral Q breakfast Tasrif Ahmed, MD      . enoxaparin (LOVENOX) injection 40 mg  40 mg Subcutaneous Q24H Tasrif Ahmed, MD      . insulin aspart (novoLOG) injection 0-9 Units  0-9 Units Subcutaneous TID WC Tasrif Ahmed, MD      . insulin glargine (LANTUS) injection 30 Units  30 Units Subcutaneous BID Tasrif Ahmed, MD      . pravastatin (PRAVACHOL) tablet 40 mg  40 mg Oral Daily Tasrif Ahmed, MD      . Melene Muller[START ON 11/22/2015] vancomycin (VANCOCIN) IVPB 750 mg/150 ml premix  750 mg Intravenous Q12H Lauren D Bajbus, RPH       Current Outpatient Prescriptions  Medication Sig Dispense Refill  . aspirin 81 MG tablet Take 81 mg by mouth daily.      Marland Kitchen. emtricitabine-rilpivir-tenofovir AF (ODEFSEY) 200-25-25 MG TABS per tablet Take 1 tablet by mouth daily with breakfast. 90 tablet 3  . glipiZIDE (GLUCOTROL) 10 MG tablet Take 1 tablet (10 mg total) by mouth 2 (two) times daily before a meal. 180 tablet 0  . insulin glargine (LANTUS) 100 UNIT/ML injection Inject 55 Units into the skin at bedtime. (Patient taking differently: Inject 60 Units into the skin 2 (two)  times daily. ) 10 mL 11  . lisinopril-hydrochlorothiazide (PRINZIDE,ZESTORETIC) 20-25 MG tablet TAKE 1 TABLET BY MOUTH DAILY 60 tablet 4  . metFORMIN (GLUCOPHAGE) 1000 MG tablet Take 1 tablet (1,000 mg total) by mouth 2 (two) times daily with a meal. 60 tablet 2  . pravastatin (PRAVACHOL) 40 MG tablet Take 1 tablet (40 mg total) by mouth daily. 30 tablet 11   Facility-Administered Medications Ordered in Other Encounters  Medication Dose Route Frequency Provider Last Rate Last Dose  . cyanocobalamin ((VITAMIN B-12)) injection 1,000 mcg  1,000 mcg Intramuscular Once Ginnie Smart, MD        Allergies: Allergies as of 11/21/2015 - Review Complete 11/21/2015  Allergen Reaction Noted  . Sulfonamide derivatives Anaphylaxis    Past  Medical History  Diagnosis Date  . HIV (human immunodeficiency virus infection) (HCC)     Followed by Dr. [name] in ID clinic.   . Type II diabetes mellitus (HCC)   . Hyperlipidemia   . Tobacco abuse   . Hypertension   . History of thrush   . Insomnia    History reviewed. No pertinent past surgical history. Family History  Problem Relation Age of Onset  . Coronary artery disease Other   . Diabetes Other   . Kidney failure Mother    Social History   Social History  . Marital Status: Single    Spouse Name: N/A  . Number of Children: N/A  . Years of Education: N/A   Occupational History  . Not on file.   Social History Main Topics  . Smoking status: Current Every Day Smoker -- 1.00 packs/day for 20 years    Types: Cigarettes  . Smokeless tobacco: Never Used     Comment: trying to quit  . Alcohol Use: No  . Drug Use: No  . Sexual Activity:    Partners: Female     Comment: pt. declined condoms   Other Topics Concern  . Not on file   Social History Narrative   Same girlfriend x 7 years. No previous TFX. No previous male/male intercourse.  Un clear how many heterosexual partners he has had >10, <100.      [place] [place] till 03/25/11 Juanell Fairly benefits approved: patient eligible for 100% discount for outpatient lasbs and office visits. Patient available for no discount for other services. Kandice Robinsons 11/11.    Review of Systems: Review of Systems  Constitutional: Positive for fever.  HENT: Negative for ear pain.   Eyes: Negative for blurred vision and pain.  Respiratory: Negative for cough, shortness of breath and wheezing.   Cardiovascular: Negative for chest pain and leg swelling.  Gastrointestinal: Positive for nausea and vomiting. Negative for abdominal pain and blood in stool.  Genitourinary: Negative for dysuria.  Musculoskeletal: Negative for myalgias.  Skin: Negative for itching.       Abscess   Neurological: Negative for sensory change, focal  weakness and headaches.    Physical Exam: Blood pressure 112/71, pulse 99, temperature 97.8 F (36.6 C), temperature source Oral, resp. rate 18, height  (1.854 m), weight 235 lb (106.595 kg), SpO2 98 %. Physical Exam  Constitutional: He is oriented to person, place, and time. He appears well-developed and well-nourished. No distress.  HENT:  Head: Normocephalic and atraumatic.  Eyes: EOM are normal. Pupils are equal, round, and reactive to light.  Neck: Neck supple. No tracheal deviation present.  Cardiovascular: Normal rate, regular rhythm and intact distal pulses.  Exam reveals no gallop and no  friction rub.   No murmur heard. Pulmonary/Chest: Effort normal and breath sounds normal. No respiratory distress. He has no wheezes. He has no rales.  Abdominal: Soft. Bowel sounds are normal. He exhibits no distension. There is no tenderness. There is no rebound.  Musculoskeletal: He exhibits no edema.  Neurological: He is alert and oriented to person, place, and time.  Skin: Skin is warm and dry.  Perianal abscess: 1.5-2 inch area of induration spontaneously draining pus. Area of induration extending to his scrotum. No erythema or increased warmth noted.     Lab results: Basic Metabolic Panel:  Recent Labs  16/10/96 1310 11/21/15 1649  NA 127*  --   K 4.7  --   CL 93*  --   CO2 19*  --   GLUCOSE 401*  --   BUN 25*  --   CREATININE 1.76* 1.57*  CALCIUM 9.2  --    Liver Function Tests:  Recent Labs  11/21/15 1310  AST 15  ALT 17  ALKPHOS 74  BILITOT 0.4  PROT 6.6  ALBUMIN 2.9*   CBC:  Recent Labs  11/21/15 1310  WBC 11.5*  NEUTROABS 9.9*  HGB 12.7*  HCT 36.6*  MCV 100.5*  PLT 221   Urinalysis:  Recent Labs  11/21/15 1440  COLORURINE YELLOW  LABSPEC 1.017  PHURINE 5.5  GLUCOSEU 250*  HGBUR NEGATIVE  BILIRUBINUR NEGATIVE  KETONESUR NEGATIVE  PROTEINUR NEGATIVE  NITRITE NEGATIVE  LEUKOCYTESUR NEGATIVE   Imaging results:  Ct Abdomen Pelvis W  Contrast  11/21/2015  CLINICAL DATA:  Groin swelling, perianal abscess, incision and drainage 6 days ago EXAM: CT ABDOMEN AND PELVIS WITH CONTRAST TECHNIQUE: Multidetector CT imaging of the abdomen and pelvis was performed using the standard protocol following bolus administration of intravenous contrast. CONTRAST:  100 ml OMNIPAQUE IOHEXOL 300 MG/ML  SOLN COMPARISON:  07/08/2015 FINDINGS: Lower chest:  Normal Hepatobiliary: Diffuse mild to moderate hepatic steatosis. Otherwise negative. Pancreas: Normal Spleen: Normal Adrenals/Urinary Tract: Fullness of the left adrenal gland with mild nodularity similar to prior study. Bilateral moderate perinephric inflammatory change. This is also similar to the prior study and likely chronic. No hydronephrosis. Bladder negative. Stomach/Bowel: Mild diverticulosis throughout the large bowel. Nonobstructive bowel gas pattern. Normal appendix. Vascular/Lymphatic: Mild atherosclerotic calcification of the aortoiliac vessels. No significant adenopathy. Reproductive: Negative Other: There is air in the perineum extending into the left inguinal region. This process is not completely visualized, extending off the inferior edge of the scan volume. There is inflammatory change accompanying this. Musculoskeletal: No acute osseous abnormalities IMPRESSION: Soft tissue air extending from the perianal region through the left perineum into the left inguinal region with accompanying inflammation. The full caudal extent of the process is not included on the images. Air may indicate soft tissue infection. There may also be related to recent instrumentation with history of incision and drainage. CT follow-up suggested, with attention to imaging the full extent of the process. Electronically Signed   By: Esperanza Heir M.D.   On: 11/21/2015 15:53   Assessment & Plan by Problem: Principal Problem:   Perianal abscess Active Problems:   Diabetes mellitus type 2, uncontrolled, with  complications (HCC)   Hyperlipidemia   TOBACCO USER   Essential hypertension   HIV (human immunodeficiency virus infection) (HCC)  Sepsis 2/2 perianal abscess Patient is presenting with a 1 week history of a perianal abscess which was drained by his PCP 4 days ago. As per patient, the abscess is becoming progressively worse after the  I&D despite being of Keflex. He does report a history of frequent abscesses which resolve spontaneously in a month. As per labs from 4 months ago, patient's HIV was well controlled - CD4 430 and HIV 1 RNA <20. Patient was afebrile on admission but does report having fevers at home. Also reports having nausea and vomiting. White count 11.5 on admission and lactic acid 3.75. He was tachycardic (HR 100) and BP soft (96/93). Physical exam showing a 1.5-2 inch area of induration with spontaneous purulent drainage in the perianal region extending to the scrotal area. No increased warmth or erythema noted making cellulitis less likely. CT of abdomen and pelvis with contrast showing soft tissue infection extending from the perianal region through the left perineum into the left inguinal region with accompanying inflammation. He was given a dose of Vanc and Zosyn in the ED and bolused with 3L IVF.  -continue broad spectrum coverage with Vancomycin for MRSA coverage.  -D/c Zosyn -if abscess does not improve with antibiotics, consult surgery for possible I&D -NS @125  cc/hr -Pending wound culture -Pending blood culture -F/u am CBC with differential  -F/u am CMP -Wound care consult -Orthostatic vitals   AKI Scr 1.76 on admission.  -Continue IV hydration -F/u am CMP   HIV  Labs from 4 months ago showing CD4 430 and HIV 1 RNA <20.  -cont Odefsey  -F/u CD4 and HIV 1 RNA   HLD -Pravachol 40 mg daily   DM2 -Lantus 30 units BID -SSI -Hold Glipizide and Metformin  -Monitor CBG -A1c pending   DVT ppx: Lovenox   Diet: heart healthy   Code: Full  Dispo: Disposition  is deferred at this time, awaiting improvement of current medical problems. Anticipated discharge in approximately 2-3 day(s).   The patient does have a current PCP (Pcp Not In System) and does need an [facility] hospital follow-up appointment after discharge.  The patient does not have transportation limitations that hinder transportation to clinic appointments.  Signed: John Giovanni, MD 11/21/2015, 6:11 PM

## 2015-11-22 LAB — HIV-1 RNA QUANT-NO REFLEX-BLD
HIV 1 RNA Quant: <20 copies/mL
LOG10 HIV-1 RNA: UNDETERMINED {Log_copies}/mL

## 2015-11-22 LAB — CBC WITH DIFFERENTIAL/PLATELET
Basophils Absolute: 0 10*3/uL (ref 0.0–0.1)
Basophils Relative: 0 %
EOS ABS: 0 10*3/uL (ref 0.0–0.7)
EOS PCT: 0 %
HCT: 31.6 % — ABNORMAL LOW (ref 39.0–52.0)
Hemoglobin: 10.9 g/dL — ABNORMAL LOW (ref 13.0–17.0)
LYMPHS ABS: 0.9 10*3/uL (ref 0.7–4.0)
Lymphocytes Relative: 10 %
MCH: 34.9 pg — AB (ref 26.0–34.0)
MCHC: 34.5 g/dL (ref 30.0–36.0)
MCV: 101.3 fL — ABNORMAL HIGH (ref 78.0–100.0)
MONO ABS: 1 10*3/uL (ref 0.1–1.0)
Monocytes Relative: 11 %
Neutro Abs: 7.4 10*3/uL (ref 1.7–7.7)
Neutrophils Relative %: 79 %
PLATELETS: 210 10*3/uL (ref 150–400)
RBC: 3.12 MIL/uL — AB (ref 4.22–5.81)
RDW: 12.5 % (ref 11.5–15.5)
WBC: 9.4 10*3/uL (ref 4.0–10.5)

## 2015-11-22 LAB — COMPREHENSIVE METABOLIC PANEL
ALT: 19 U/L (ref 17–63)
AST: 12 U/L — AB (ref 15–41)
Albumin: 2.3 g/dL — ABNORMAL LOW (ref 3.5–5.0)
Alkaline Phosphatase: 63 U/L (ref 38–126)
Anion gap: 6 (ref 5–15)
BILIRUBIN TOTAL: 0.3 mg/dL (ref 0.3–1.2)
BUN: 17 mg/dL (ref 6–20)
CHLORIDE: 103 mmol/L (ref 101–111)
CO2: 26 mmol/L (ref 22–32)
CREATININE: 1.51 mg/dL — AB (ref 0.61–1.24)
Calcium: 8.5 mg/dL — ABNORMAL LOW (ref 8.9–10.3)
GFR calc Af Amer: >60 mL/min (ref >60)
GFR, EST NON AFRICAN AMERICAN: 54 mL/min — AB (ref >60)
GLUCOSE: 302 mg/dL — AB (ref 65–99)
Potassium: 4.6 mmol/L (ref 3.5–5.1)
Sodium: 135 mmol/L (ref 135–145)
Total Protein: 5.9 g/dL — ABNORMAL LOW (ref 6.5–8.1)

## 2015-11-22 LAB — URINE CULTURE: Culture: NO GROWTH

## 2015-11-22 LAB — GLUCOSE, CAPILLARY
GLUCOSE-CAPILLARY: 260 mg/dL — AB (ref 65–99)
GLUCOSE-CAPILLARY: 360 mg/dL — AB (ref 65–99)
GLUCOSE-CAPILLARY: 355 mg/dL — AB (ref 65–99)
Glucose-Capillary: 387 mg/dL — ABNORMAL HIGH (ref 65–99)

## 2015-11-22 LAB — HEMOGLOBIN A1C
Hgb A1c MFr Bld: 11.1 % — ABNORMAL HIGH (ref 4.8–5.6)
Mean Plasma Glucose: 272 mg/dL

## 2015-11-22 LAB — T-HELPER CELLS (CD4) COUNT (NOT AT ARMC)
CD4 % Helper T Cell: 32 % — ABNORMAL LOW (ref 33–55)
CD4 T Cell Abs: 280 /uL — ABNORMAL LOW (ref 400–2700)

## 2015-11-22 MED ORDER — PIPERACILLIN-TAZOBACTAM 3.375 G IVPB
3.3750 g | Freq: Three times a day (TID) | INTRAVENOUS | Status: DC
  Administered 2015-11-22 – 2015-11-24 (×6): 3.375 g via INTRAVENOUS
  Filled 2015-11-22 (×8): qty 50

## 2015-11-22 MED ORDER — SODIUM CHLORIDE 0.9 % IV SOLN
INTRAVENOUS | Status: DC
  Administered 2015-11-23 (×2): via INTRAVENOUS

## 2015-11-22 MED ORDER — HYDROMORPHONE HCL 1 MG/ML IJ SOLN
0.5000 mg | INTRAMUSCULAR | Status: DC | PRN
  Administered 2015-11-22 – 2015-11-23 (×5): 1 mg via INTRAVENOUS
  Filled 2015-11-22 (×5): qty 1

## 2015-11-22 MED ORDER — OXYCODONE-ACETAMINOPHEN 5-325 MG PO TABS
1.0000 | ORAL_TABLET | ORAL | Status: DC | PRN
  Administered 2015-11-22 – 2015-11-24 (×4): 2 via ORAL
  Filled 2015-11-22 (×4): qty 2

## 2015-11-22 MED ORDER — LIDOCAINE-EPINEPHRINE 2 %-1:100000 IJ SOLN
20.0000 mL | Freq: Once | INTRAMUSCULAR | Status: DC
  Filled 2015-11-22: qty 20

## 2015-11-22 MED ORDER — GLUCERNA SHAKE PO LIQD
237.0000 mL | Freq: Two times a day (BID) | ORAL | Status: DC
  Administered 2015-11-22 – 2015-11-24 (×3): 237 mL via ORAL

## 2015-11-22 MED ORDER — LIDOCAINE HCL (PF) 2 % IJ SOLN
0.0000 mL | Freq: Once | INTRAMUSCULAR | Status: DC | PRN
  Filled 2015-11-22: qty 20

## 2015-11-22 MED ORDER — NICOTINE 14 MG/24HR TD PT24
14.0000 mg | MEDICATED_PATCH | Freq: Every day | TRANSDERMAL | Status: DC
Start: 2015-11-22 — End: 2015-11-24
  Administered 2015-11-22 – 2015-11-24 (×3): 14 mg via TRANSDERMAL
  Filled 2015-11-22 (×3): qty 1

## 2015-11-22 MED ORDER — NICOTINE POLACRILEX 2 MG MT GUM
2.0000 mg | CHEWING_GUM | OROMUCOSAL | Status: DC | PRN
  Filled 2015-11-22: qty 1

## 2015-11-22 MED ORDER — INSULIN GLARGINE 100 UNIT/ML ~~LOC~~ SOLN
45.0000 [IU] | Freq: Two times a day (BID) | SUBCUTANEOUS | Status: DC
  Administered 2015-11-22 (×2): 45 [IU] via SUBCUTANEOUS
  Filled 2015-11-22 (×4): qty 0.45

## 2015-11-22 MED ORDER — SODIUM CHLORIDE 0.9 % IV SOLN
INTRAVENOUS | Status: DC
  Administered 2015-11-22: 16:00:00 via INTRAVENOUS

## 2015-11-22 MED ORDER — ADULT MULTIVITAMIN W/MINERALS CH
1.0000 | ORAL_TABLET | Freq: Every day | ORAL | Status: DC
  Administered 2015-11-22 – 2015-11-24 (×3): 1 via ORAL
  Filled 2015-11-22 (×3): qty 1

## 2015-11-22 NOTE — Progress Notes (Signed)
Pt given tylenol and phenergan last night, assessed his wound left groin and sacral pt refused to apply dressing.

## 2015-11-22 NOTE — Progress Notes (Signed)
Initial Nutrition Assessment  DOCUMENTATION CODES:   Obesity unspecified  INTERVENTION:   Glucerna Shake po BID, each supplement provides 220 kcal and 10 grams of protein MVI daily  NUTRITION DIAGNOSIS:   Inadequate oral intake related to poor appetite as evidenced by meal completion < 50%.  GOAL:   Patient will meet greater than or equal to 90% of their needs  MONITOR:   PO intake, Supplement acceptance, Labs, Weight trends, Skin, I & O's  REASON FOR ASSESSMENT:   Malnutrition Screening Tool    ASSESSMENT:   Patient is a 44 year old Caucasian male with a past medical history of HIV, DM 2, hypertension, hyperlipidemia presenting [facility] with the chief complaint of "boils." States he first noticed this "boil" in his buttock/ anal region about 1 week ago and went to his PCP on Wednesday (11/17/15). States his PCP performed an I&D that day and 2 days later he was prescribed an antibiotic (Keflex). Also reports having associated pain in the area, fevers, nausea, and vomiting since Wednesday night. States the "boil" got bigger in size after the I&D procedure and now has to spread from his buttock region to the groin. Denies having pain with defecation, blood per rectum, or dysuria. Denies shaving the area or any history of cuts in the area. Patient does report having a two-year history of frequent "boils" which resolve spontaneously within a month, most recent 1.5 weeks ago on his right lower back. States he normally "pops them with a pen" but did not do that for the buttock/ groin area.   Pt admitted with sepsis secondary to perineal abscess.   Pt receiving IV antiobiotics. Surgery consulted; plan for potential exam and I&D at bedside tomorrow.  Pt sleeping soundly at time of visit and would not respond to the sound of his name being called. Unable to complete Nutrition-Focused physical exam at this time. All except pt's head covered completely with multiple blankets.    Meal completion 45%. Per chart review, pt has had poor appetite > 1 day. Pt also with increased nutrient needs for wound healing.   Wt hx reviewed. UBW: 235#. Weight changes not significant for time frame.   Labs reviewed: CBGS: 214-369. No recent Hgb A1c, however, historically ranges from 7.4-9.4.   Diet Order:  Diet Heart Room service appropriate?: Yes; Fluid consistency:: Thin Diet NPO time specified  Skin:  Wound (see comment) (lt groin incision/ perineal abcess)  Last BM:  11/20/15  Height:   Ht Readings from Last 1 Encounters:  11/22/15 6\' 1"  (1.854 m)    Weight:   Wt Readings from Last 1 Encounters:  11/22/15 231 lb 4.2 oz (104.9 kg)    Ideal Body Weight:  83.6 kg  BMI:  Body mass index is 30.52 kg/(m^2).  Estimated Nutritional Needs:   Kcal:  2200-2400  Protein:  1110-120 grams  Fluid:  2.2-2.4 L  EDUCATION NEEDS:   Education needs no appropriate at this time  Taraneh Metheney A. Mayford KnifeWilliams, RD, LDN, CDE Pager: [phone] After hours Pager: [phone]

## 2015-11-22 NOTE — Progress Notes (Signed)
ANTIBIOTIC CONSULT NOTE - Follow Up  Pharmacy Consult for vancomycin, zosyn Indication: cellulitis  Allergies  Allergen Reactions  . Sulfonamide Derivatives Anaphylaxis    REACTION: skin welps, tongue swells, throat closes up    Patient Measurements: Height: 6\' 1"  (185.4 cm) Weight: 231 lb 4.2 oz (104.9 kg) IBW/kg (Calculated) : 79.9   Vital Signs: Temp: 99.5 F (37.5 C) (11/28 0505) Temp Source: Oral (11/28 0505) BP: 90/52 mmHg (11/28 0505) Pulse Rate: 98 (11/28 0505) Intake/Output from previous day: 11/27 0701 - 11/28 0700 In: 1907.1 [P.O.:480; I.V.:1277.1; IV Piggyback:150] Out: 800 [Urine:800] Intake/Output from this shift:    Labs:  Recent Labs  11/21/15 1310 11/21/15 1649 11/21/15 1925 11/22/15 0418  WBC 11.5*  --  9.6 9.4  HGB 12.7*  --  11.7* 10.9*  PLT 221  --  199 210  CREATININE 1.76* 1.57*  --  1.51*   Estimated Creatinine Clearance: 77.7 mL/min (by C-G formula based on Cr of 1.51). No results for input(s): VANCOTROUGH, VANCOPEAK, VANCORANDOM, GENTTROUGH, GENTPEAK, GENTRANDOM, TOBRATROUGH, TOBRAPEAK, TOBRARND, AMIKACINPEAK, AMIKACINTROU, AMIKACIN in the last 72 hours.   Microbiology: Recent Results (from the past 720 hour(s))  Culture, blood (routine x 2)     Status: None (Preliminary result)   Collection Time: 11/21/15  2:04 PM  Result Value Ref Range Status   Specimen Description RIGHT FOREARM  Final   Special Requests BOTTLES DRAWN AEROBIC AND ANAEROBIC 6ML  Final   Culture PENDING  Incomplete   Report Status PENDING  Incomplete    Medical History: Past Medical History  Diagnosis Date  . HIV (human immunodeficiency virus infection) (HCC)     Followed by Dr. Ninetta LightsHatcher in ID clinic.   . Type II diabetes mellitus (HCC)   . Hyperlipidemia   . Tobacco abuse   . Hypertension   . History of thrush   . Insomnia    Assessment: [age] YOM with HIV s/p I&D of groin abscess 5 days ago. Was given Bactrim as outpt, but d/t sulfa allergy he did not  take. Started on keflex 3 days prior to admission. Abscess not improving and he presents to ED 11/27. Pharmacy consulted for Vanc and Zosyn to treat cellulitis.  SCr 1.51, CrCl improving to ~2675mL/min (normalized CrCl 60) WBC 9.4, afeb, hypotensive  Vanc 11/27 >> Zosyn 11/27 >>  11/27 blood: pending 11/27 urine: sent  Goal of Therapy:  Vancomycin trough level 10-15 mcg/ml  Eradication of infection  Plan:  -Continue Vancomycin 750mg  IV q12h as maintenance -Initiate Zosyn 3.375 g IV q8h over 4h infusion -f/u c/s, clinical progression, renal function -[facility]   Kathlynn GrateAdam Corey PharmD candidate  11/22/2015 8:56 AM    I agree with the assessment and plan.   Agapito GamesAlison Kito Cuffe, PharmD, BCPS Clinical Pharmacist Pager: [phone] 11/22/2015 9:56 AM

## 2015-11-22 NOTE — Consult Note (Signed)
Reason for Consult:  Rectal abscess Referring Physician: Dr. Lonell Face    Harold Crosby is an 44 y.o. male.  HPI: Pt with well controlled HIV seen and underwent I&D of left perirectal abscess last week, with fever up to 102. On 11/17/15 his PCP did a local I&D of the site.  Post procedure he was placed on Keflex, he has an allergy to sulfa and could not take Bactrim ordered initially.   Since the I&D he has developed increasing pain, and drainage.  Abcess may be going to scrotal area, blood glucose is now uncontrolledand presented to the ED with pain and swelling.  He lost the packing several days ago after the I&D.  He is reporting fever and chills in the ED.  He was seen and admitted by Medicine yesterday. Work up in the ED showed he is currently afebrile, VSS BP down some this AM. Admit labs show some renal insuffiencey, creatinine up to 1.76.  WBC 11.5, lactate 3.75, Glucose 401. Blood and urine cultures pending.  CT of the abdomen and pelvis shows hepatic steatosis, Bilateral moderate perinephric inflammatory change, likely chronic. Soft tissue air extending from the perianal region through the left perineum into the left inguinal region with accompanying inflammation. The full caudal extent of the process is not included on the images. Air may indicate soft tissue infection. He was started on Vancomycin and Zosyn yesterday.  We are ask to see today.  He has has similar areas on the right, but they drain and he has never seen anyone for it.   Past Medical History  Diagnosis Date  . HIV (human immunodeficiency virus infection) (Harold Crosby)     Followed by Dr. Johnnye Crosby in [facility].   . Type II diabetes mellitus ([facility])   . Hyperlipidemia   . Tobacco abuse   . Hypertension   . History of thrush   . Insomnia     History reviewed. No pertinent past surgical history.  Family History  Problem Relation Age of Onset  . Coronary artery disease Other   . Diabetes Other   . Kidney failure Mother       Social History:  reports that he has been smoking Cigarettes.  He has a 20 pack-year smoking history. He has never used smokeless tobacco. He reports that he does not drink alcohol or use illicit drugs. 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 Sulfonamide Derivatives Anaphylaxis    REACTION: skin welps, tongue swells, throat closes up    Medications:  Prior to Admission:  Prescriptions prior to admission  Medication Sig Dispense Refill Last Dose  . aspirin 81 MG tablet Take 81 mg by mouth daily.     11/21/2015 at Unknown time  . emtricitabine-rilpivir-tenofovir AF (ODEFSEY) 200-25-25 MG TABS per tablet Take 1 tablet by mouth daily with breakfast. 90 tablet 3 11/21/2015 at Unknown time  . glipiZIDE (GLUCOTROL) 10 MG tablet Take 1 tablet (10 mg total) by mouth 2 (two) times daily before a meal. 180 tablet 0 11/21/2015 at Unknown time  . insulin glargine (LANTUS) 100 UNIT/ML injection Inject 55 Units into the skin at bedtime. (Patient taking differently: Inject 60 Units into the skin 2 (two) times daily. ) 10 mL 11 11/21/2015 at Unknown time  . lisinopril-hydrochlorothiazide (PRINZIDE,ZESTORETIC) 20-25 MG tablet TAKE 1 TABLET BY MOUTH DAILY 60 tablet 4 11/21/2015  at Unknown time  . metFORMIN (GLUCOPHAGE) 1000 MG tablet Take 1 tablet (1,000 mg total) by mouth 2 (two) times daily with a meal. 60 tablet 2 11/21/2015 at Unknown time  . pravastatin (PRAVACHOL) 40 MG tablet Take 1 tablet (40 mg total) by mouth daily. 30 tablet 11 11/21/2015 at Unknown time   Scheduled: . aspirin EC  81 mg Oral Daily  . emtricitabine-tenofovir AF  1 tablet Oral Q breakfast  . enoxaparin (LOVENOX) injection  40 mg Subcutaneous Q24H  . insulin aspart  0-15 Units Subcutaneous TID WC  . insulin aspart  0-5 Units Subcutaneous QHS  . insulin glargine  45 Units Subcutaneous BID  .  piperacillin-tazobactam (ZOSYN)  IV  3.375 g Intravenous 3 times per day  . pravastatin  40 mg Oral q1800  . rilpivirine  25 mg Oral Q breakfast  . vancomycin  750 mg Intravenous Q12H   Continuous: . sodium chloride     GYF:[id number], promethazine Anti-infectives    Start     Dose/Rate Route Frequency Ordered Stop   11/22/15 0900  piperacillin-tazobactam (ZOSYN) IVPB 3.375 g     3.375 g 12.5 mL/hr over 240 Minutes Intravenous 3 times per day 11/22/15 0826     11/22/15 0800  emtricitabine-rilpivir-tenofovir AF (ODEFSEY) 200-25-25 MG per tablet 1 tablet  Status:  Discontinued     1 tablet Oral Daily with breakfast 11/21/15 1636 11/21/15 1843   11/22/15 0800  rilpivirine (EDURANT) tablet 25 mg     25 mg Oral Daily with breakfast 11/21/15 1843     11/22/15 0800  emtricitabine-tenofovir AF (DESCOVY) 200-25 MG per tablet 1 tablet     1 tablet Oral Daily with breakfast 11/21/15 1843     11/22/15 0300  vancomycin (VANCOCIN) IVPB 750 mg/150 ml premix     750 mg 150 mL/hr over 60 Minutes Intravenous Every 12 hours 11/21/15 1450     11/21/15 1415  vancomycin (VANCOCIN) 1,500 mg in sodium chloride 0.9 % 500 mL IVPB     1,500 mg 250 mL/hr over 120 Minutes Intravenous  Once 11/21/15 1413 11/21/15 1828   11/21/15 1400  piperacillin-tazobactam (ZOSYN) IVPB 3.375 g     3.375 g 100 mL/hr over 30 Minutes Intravenous  Once 11/21/15 1351 11/21/15 1532      Results for orders placed or performed during the hospital encounter of 11/21/15 (from the past 48 hour(s))  Comprehensive metabolic panel     Status: Abnormal   Collection Time: 11/21/15  1:10 PM  Result Value Ref Range   Sodium 127 (L) 135 - 145 mmol/L   Potassium 4.7 3.5 - 5.1 mmol/L   Chloride 93 (L) 101 - 111 mmol/L   CO2 19 (L) 22 - 32 mmol/L   Glucose, Bld 401 (H) 65 - 99 mg/dL   BUN 25 (H) 6 - 20 mg/dL   Creatinine, Ser 1.76 (H) 0.61 - 1.24 mg/dL   Calcium 9.2 8.9 - 10.3 mg/dL   Total Protein 6.6 6.5 - 8.1 g/dL   Albumin 2.9 (L)  3.5 - 5.0 g/dL   AST 15 15 - 41 U/L   ALT 17 17 - 63 U/L   Alkaline Phosphatase 74 38 - 126 U/L   Total Bilirubin 0.4 0.3 - 1.2 mg/dL   GFR calc non Af Amer 45 (L) >60 mL/min   GFR calc Af Amer 52 (L) >60 mL/min    Comment: (NOTE) The eGFR has been calculated using the CKD EPI equation. This calculation has not been validated  in all clinical situations. eGFR's persistently <60 mL/min signify possible Chronic Kidney Disease.    Anion gap 15 5 - 15  CBC with Differential     Status: Abnormal   Collection Time: 11/21/15  1:10 PM  Result Value Ref Range   WBC 11.5 (H) 4.0 - 10.5 K/uL   RBC 3.64 (L) 4.22 - 5.81 MIL/uL   Hemoglobin 12.7 (L) 13.0 - 17.0 g/dL   HCT 36.6 (L) 39.0 - 52.0 %   MCV 100.5 (H) 78.0 - 100.0 fL   MCH 34.9 (H) 26.0 - 34.0 pg   MCHC 34.7 30.0 - 36.0 g/dL   RDW 12.3 11.5 - 15.5 %   Platelets 221 150 - 400 K/uL   Neutrophils Relative % 87 %   Neutro Abs 9.9 (H) 1.7 - 7.7 K/uL   Lymphocytes Relative 7 %   Lymphs Abs 0.8 0.7 - 4.0 K/uL   Monocytes Relative 6 %   Monocytes Absolute 0.7 0.1 - 1.0 K/uL   Eosinophils Relative 0 %   Eosinophils Absolute 0.0 0.0 - 0.7 K/uL   Basophils Relative 0 %   Basophils Absolute 0.0 0.0 - 0.1 K/uL  I-Stat CG4 Lactic Acid, ED  (not at [facility])     Status: Abnormal   Collection Time: 11/21/15  1:17 PM  Result Value Ref Range   Lactic Acid, Venous 3.75 (HH) 0.5 - 2.0 mmol/L   Comment NOTIFIED PHYSICIAN   Culture, blood (routine x 2)     Status: None (Preliminary result)   Collection Time: 11/21/15  2:04 PM  Result Value Ref Range   Specimen Description RIGHT FOREARM    Special Requests BOTTLES DRAWN AEROBIC AND ANAEROBIC 6ML    Culture PENDING    Report Status PENDING   Urinalysis, Routine w reflex microscopic (not at [facility])     Status: Abnormal   Collection Time: 11/21/15  2:40 PM  Result Value Ref Range   Color, Urine YELLOW YELLOW   APPearance CLEAR CLEAR   Specific Gravity, Urine 1.017 1.005 - 1.030   pH 5.5 5.0 - 8.0    Glucose, UA 250 (A) NEGATIVE mg/dL   Hgb urine dipstick NEGATIVE NEGATIVE   Bilirubin Urine NEGATIVE NEGATIVE   Ketones, ur NEGATIVE NEGATIVE mg/dL   Protein, ur NEGATIVE NEGATIVE mg/dL   Nitrite NEGATIVE NEGATIVE   Leukocytes, UA NEGATIVE NEGATIVE    Comment: MICROSCOPIC NOT DONE ON URINES WITH NEGATIVE PROTEIN, BLOOD, LEUKOCYTES, NITRITE, OR GLUCOSE <1000 mg/dL.  Urine culture     Status: None (Preliminary result)   Collection Time: 11/21/15  2:40 PM  Result Value Ref Range   Specimen Description URINE, CLEAN CATCH    Special Requests Immunocompromised    Culture NO GROWTH < 24 HOURS    Report Status PENDING   Hemoglobin A1c     Status: Abnormal   Collection Time: 11/21/15  4:49 PM  Result Value Ref Range   Hgb A1c MFr Bld 11.1 (H) 4.8 - 5.6 %    Comment: (NOTE)         Pre-diabetes: 5.7 - 6.4         Diabetes: >6.4         Glycemic control for adults with diabetes: <7.0    Mean Plasma Glucose 272 mg/dL    Comment: (NOTE) Performed At: [facility] [address] [place], [place] [id number] Lindon Romp MD LY:[id number]   Creatinine, serum     Status: Abnormal   Collection Time: 11/21/15  4:49 PM  Result Value Ref Range   Creatinine, Ser 1.57 (H) 0.61 - 1.24 mg/dL   GFR calc non Af Amer 51 (L) >60 mL/min   GFR calc Af Amer 59 (L) >60 mL/min    Comment: (NOTE) The eGFR has been calculated using the CKD EPI equation. This calculation has not been validated in all clinical situations. eGFR's persistently <60 mL/min signify possible Chronic Kidney Disease.   I-Stat CG4 Lactic Acid, ED  (not at [facility])     Status: None   Collection Time: 11/21/15  5:07 PM  Result Value Ref Range   Lactic Acid, Venous 1.79 0.5 - 2.0 mmol/L  Glucose, capillary     Status: Abnormal   Collection Time: 11/21/15  7:24 PM  Result Value Ref Range   Glucose-Capillary 214 (H) 65 - 99 mg/dL  CBC     Status: Abnormal   Collection Time: 11/21/15  7:25 PM  Result Value Ref Range   WBC  9.6 4.0 - 10.5 K/uL   RBC 3.37 (L) 4.22 - 5.81 MIL/uL   Hemoglobin 11.7 (L) 13.0 - 17.0 g/dL   HCT 33.8 (L) 39.0 - 52.0 %   MCV 100.3 (H) 78.0 - 100.0 fL   MCH 34.7 (H) 26.0 - 34.0 pg   MCHC 34.6 30.0 - 36.0 g/dL   RDW 12.3 11.5 - 15.5 %   Platelets 199 150 - 400 K/uL  Glucose, capillary     Status: Abnormal   Collection Time: 11/21/15 10:27 PM  Result Value Ref Range   Glucose-Capillary 369 (H) 65 - 99 mg/dL  Comprehensive metabolic panel     Status: Abnormal   Collection Time: 11/22/15  4:18 AM  Result Value Ref Range   Sodium 135 135 - 145 mmol/L    Comment: DELTA CHECK NOTED   Potassium 4.6 3.5 - 5.1 mmol/L   Chloride 103 101 - 111 mmol/L   CO2 26 22 - 32 mmol/L   Glucose, Bld 302 (H) 65 - 99 mg/dL   BUN 17 6 - 20 mg/dL   Creatinine, Ser 1.51 (H) 0.61 - 1.24 mg/dL   Calcium 8.5 (L) 8.9 - 10.3 mg/dL   Total Protein 5.9 (L) 6.5 - 8.1 g/dL   Albumin 2.3 (L) 3.5 - 5.0 g/dL   AST 12 (L) 15 - 41 U/L   ALT 19 17 - 63 U/L   Alkaline Phosphatase 63 38 - 126 U/L   Total Bilirubin 0.3 0.3 - 1.2 mg/dL   GFR calc non Af Amer 54 (L) >60 mL/min   GFR calc Af Amer >60 >60 mL/min    Comment: (NOTE) The eGFR has been calculated using the CKD EPI equation. This calculation has not been validated in all clinical situations. eGFR's persistently <60 mL/min signify possible Chronic Kidney Disease.    Anion gap 6 5 - 15  CBC WITH DIFFERENTIAL     Status: Abnormal   Collection Time: 11/22/15  4:18 AM  Result Value Ref Range   WBC 9.4 4.0 - 10.5 K/uL   RBC 3.12 (L) 4.22 - 5.81 MIL/uL   Hemoglobin 10.9 (L) 13.0 - 17.0 g/dL   HCT 31.6 (L) 39.0 - 52.0 %   MCV 101.3 (H) 78.0 - 100.0 fL   MCH 34.9 (H) 26.0 - 34.0 pg   MCHC 34.5 30.0 - 36.0 g/dL   RDW 12.5 11.5 - 15.5 %   Platelets 210 150 - 400 K/uL   Neutrophils Relative % 79 %   Neutro Abs 7.4 1.7 - 7.7  K/uL   Lymphocytes Relative 10 %   Lymphs Abs 0.9 0.7 - 4.0 K/uL   Monocytes Relative 11 %   Monocytes Absolute 1.0 0.1 - 1.0 K/uL    Eosinophils Relative 0 %   Eosinophils Absolute 0.0 0.0 - 0.7 K/uL   Basophils Relative 0 %   Basophils Absolute 0.0 0.0 - 0.1 K/uL  Glucose, capillary     Status: Abnormal   Collection Time: 11/22/15  7:47 AM  Result Value Ref Range   Glucose-Capillary 260 (H) 65 - 99 mg/dL   Comment 1 Notify RN     Ct Abdomen Pelvis W Contrast  11/21/2015  CLINICAL DATA:  Groin swelling, perianal abscess, incision and drainage 6 days ago EXAM: CT ABDOMEN AND PELVIS WITH CONTRAST TECHNIQUE: Multidetector CT imaging of the abdomen and pelvis was performed using the standard protocol following bolus administration of intravenous contrast. CONTRAST:  100 ml OMNIPAQUE IOHEXOL 300 MG/ML  SOLN COMPARISON:  07/08/2015 FINDINGS: Lower chest:  Normal Hepatobiliary: Diffuse mild to moderate hepatic steatosis. Otherwise negative. Pancreas: Normal Spleen: Normal Adrenals/Urinary Tract: Fullness of the left adrenal gland with mild nodularity similar to prior study. Bilateral moderate perinephric inflammatory change. This is also similar to the prior study and likely chronic. No hydronephrosis. Bladder negative. Stomach/Bowel: Mild diverticulosis throughout the large bowel. Nonobstructive bowel gas pattern. Normal appendix. Vascular/Lymphatic: Mild atherosclerotic calcification of the aortoiliac vessels. No significant adenopathy. Reproductive: Negative Other: There is air in the perineum extending into the left inguinal region. This process is not completely visualized, extending off the inferior edge of the scan volume. There is inflammatory change accompanying this. Musculoskeletal: No acute osseous abnormalities IMPRESSION: Soft tissue air extending from the perianal region through the left perineum into the left inguinal region with accompanying inflammation. The full caudal extent of the process is not included on the images. Air may indicate soft tissue infection. There may also be related to recent instrumentation with  history of incision and drainage. CT follow-up suggested, with attention to imaging the full extent of the process. Electronically Signed   By: Skipper Cliche M.D.   On: 11/21/2015 15:53    Review of Systems  Constitutional: Positive for fever and chills. Negative for weight loss.  HENT: Negative.   Eyes: Negative.   Respiratory: Negative.   Cardiovascular: Negative.   Gastrointestinal: Positive for blood in stool.  Genitourinary:       Groin is painful on left and scrotum and penis are tender.  Musculoskeletal: Negative.   Skin:       Scrotum is red and tender  Neurological: Negative.   Endo/Heme/Allergies: Negative.   Psychiatric/Behavioral: Negative.    Blood pressure 90/52, pulse 98, temperature 99.5 F (37.5 C), temperature source Oral, resp. rate 18, height 6' 1" (1.854 m), weight 104.9 kg (231 lb 4.2 oz), SpO2 96 %. Physical Exam  Constitutional: He is oriented to person, place, and time. He appears well-developed and well-nourished. No distress.  HENT:  Head: Normocephalic and atraumatic.  Nose: Nose normal.  Eyes: Conjunctivae and EOM are normal. Right eye exhibits no discharge. Left eye exhibits no discharge. No scleral icterus.  Neck: Neck supple. No JVD present. No tracheal deviation present. No thyromegaly present.  Cardiovascular: Normal rate, regular rhythm, normal heart sounds and intact distal pulses.   No murmur heard. Respiratory: Effort normal and breath sounds normal. No respiratory distress. He has no wheezes. He has no rales. He exhibits no tenderness.  GI: Soft. Bowel sounds are normal. He exhibits no  distension and no mass. There is no tenderness. There is no rebound and no guarding.  Genitourinary:        Musculoskeletal: He exhibits no edema or tenderness.  Lymphadenopathy:    He has no cervical adenopathy.  Neurological: He is alert and oriented to person, place, and time. No cranial nerve deficit.  Skin: Skin is warm and dry. No rash noted. He  is not diaphoretic. There is erythema. No pallor.  Psychiatric: He has a normal mood and affect. His behavior is normal. Judgment and thought content normal.    Assessment/Plan: 2 cm Left Perirectal abscess with air extending into the left perineium S/P I&D Left perirectal abscess with cellulitis  HIV reportedly controlled AODM  Poor control  He reports glucose runs around 300 at home.  A1C  11.1 Tobacco use 2PPD 25 years, down to 1PPD Hypertension Hyperlipidemia Body mass index is 30.5   Plan:  We are going to examine with applicator stick, pack with iodoform. Continue antibiotics, add Sitz baths, pack with iodoform.  I probed it at bedside, and initially opening was to small for tip of applicator stick.  I did finally get in and it is about 2 cm deep.  I will get a bedside debrement tray to bedside and if not better, make a larger opening tomorrow.    Dr. Rosendo Gros will see later and review CT.   Harold Crosby,Harold Crosby 11/22/2015, 10:42 AM

## 2015-11-22 NOTE — Progress Notes (Signed)
Inpatient Diabetes Program Recommendations  AACE/ADA: New Consensus Statement on Inpatient Glycemic Control (2015)  Target Ranges:  Prepandial:   less than 140 mg/dL      Peak postprandial:   less than 180 mg/dL (1-2 hours)      Critically ill patients:  140 - 180 mg/dL   Review of Glycemic Control:  Results for Harold Crosby, Lofton L (MRN [id number]) as of 11/22/2015 13:02  Ref. Range 11/21/2015 19:24 11/21/2015 22:27 11/22/2015 07:47 11/22/2015 11:33  Glucose-Capillary Latest Ref Range: 65-99 mg/dL 409214 (H) 811369 (H) 914260 (H) 360 (H)   Results for Harold Crosby, [name] (MRN [id number]) as of 11/22/2015 13:02  Ref. Range 11/21/2015 16:49  Hemoglobin A1C Latest Ref Range: 4.8-5.6 % 11.1 (H)   Diabetes history:  Type 2 diabetes Outpatient Diabetes medications: Patient reports that he takes 30 units of long-acting and 30 units of short acting twice daily.  [facility] on patient's report [facility] it sounds like patient is taking NPH and Regular at home since Lantus would be much more expensive.   Current orders for Inpatient glycemic control:  Lantus 45 units bid, Novolog moderate tid with meals Inpatient Diabetes Program Recommendations:   Called resident regarding patient's reports of home medications.  Also called pharmacist.   Patient states that he "cannot get his blood sugars down, no matter what".  He see's Dr. Mayford KnifeWilliams on [address].  May consider adding Novolog meal coverage 15 units tid with meals while in the hospital.  Note that patient will not likely be able to afford Lantus/Novolog regimen at discharge. He will likely then need to be restarted on home insulin regimen (however will likely need modified doses).  Will follow.  Thanks, Beryl MeagerJenny Adryen Cookson, RN, BC-ADM Inpatient Diabetes Coordinator Pager [phone] (8a-5p)

## 2015-11-22 NOTE — Progress Notes (Signed)
MEDICATION RELATED NOTE    Pharmacy Re:  Insulin Indication: Verification of Home Meds  Medications:  Prescriptions prior to admission  Medication Sig Dispense Refill Last Dose  . aspirin 81 MG tablet Take 81 mg by mouth daily.     11/21/2015 at Unknown time  . emtricitabine-rilpivir-tenofovir AF (ODEFSEY) 200-25-25 MG TABS per tablet Take 1 tablet by mouth daily with breakfast. 90 tablet 3 11/21/2015 at Unknown time  . glipiZIDE (GLUCOTROL) 10 MG tablet Take 1 tablet (10 mg total) by mouth 2 (two) times daily before a meal. 180 tablet 0 11/21/2015 at Unknown time  . insulin glargine (LANTUS) 100 UNIT/ML injection Inject 55 Units into the skin at bedtime. (Patient taking differently: Inject 60 Units into the skin 2 (two) times daily. ) 10 mL 11 11/21/2015 at Unknown time  . lisinopril-hydrochlorothiazide (PRINZIDE,ZESTORETIC) 20-25 MG tablet TAKE 1 TABLET BY MOUTH DAILY 60 tablet 4 11/21/2015 at Unknown time  . metFORMIN (GLUCOPHAGE) 1000 MG tablet Take 1 tablet (1,000 mg total) by mouth 2 (two) times daily with a meal. 60 tablet 2 11/21/2015 at Unknown time  . pravastatin (PRAVACHOL) 40 MG tablet Take 1 tablet (40 mg total) by mouth daily. 30 tablet 11 11/21/2015 at Unknown time    Assessment: 44 yo male with diabetes.  I was asked by the diabetes coordinator Victorino Dike(Jennifer S.) to verify if possible the insulin type and dose for the patient prior to admit.  Currently on his admission meds - there is a script with 11 refills for Lantus insulin.  I called two pharmacies [facility] has listed ([facility]) and neither of these pharmacies have filled insulin for him.  Patient's significant other admits that he has been using her Lantus insulin and injects 30 units twice daily of Lantus and 30 units of Novolog.  Plan:  - Will need finances verified as to ability to pay for his insulin and other medications (Glipizide/Metformin) are also on his admission meds.   Nadara MustardNita Zayvon Alicea, PharmD.,  MS Clinical Pharmacist Pager:  [phone] Thank you for allowing pharmacy to be part of this patients care team. 11/22/2015,1:48 PM

## 2015-11-22 NOTE — Progress Notes (Signed)
Subjective: Patient was seen and examined at bedside this morning. Still complaining of some pain at the site of the abscess. Denies having any fevers, chills, nausea, vomiting, or abdominal pain. No other complaints.    Objective: Vital signs in last 24 hours: Filed Vitals:   11/21/15 1800 11/21/15 1855 11/21/15 2140 11/22/15 0505  BP: 112/71 120/77 123/71 90/52  Pulse: 99 100 107 98  Temp:  99.5 F (37.5 C) 98.6 F (37 C) 99.5 F (37.5 C)  TempSrc:  Oral Oral Oral  Resp: 18 18 19 18   Height:    6\' 1"  (1.854 m)  Weight:    231 lb 4.2 oz (104.9 kg)  SpO2: 98% 94% 98% 96%   Weight change:   Intake/Output Summary (Last 24 hours) at 11/22/15 1213 Last data filed at 11/22/15 0900  Gross per 24 hour  Intake 2147.08 ml  Output    800 ml  Net 1347.08 ml   Physical Exam:  Constitutional: He is oriented to person, place, and time. He appears well-developed and well-nourished. No distress.  Cardiovascular: Normal rate, regular rhythm and intact distal pulses. Exam reveals no gallop and no friction rub.  No murmur heard. Pulmonary/Chest: Effort normal and breath sounds normal. No respiratory distress. He has no wheezes. He has no rales.  Abdominal: Soft. Bowel sounds are normal. He exhibits no distension. There is no tenderness. There is no rebound.  Musculoskeletal: He exhibits no edema.  Neurological: He is alert and oriented to person, place, and time.  Skin: Skin is warm and dry.  Perianal abscess: 1.5-2 inch area of induration spontaneously draining pus. Area of induration extending to his scrotum. No erythema or increased warmth noted.   Lab Results: Basic Metabolic Panel:  Recent Labs Lab 11/21/15 1310 11/21/15 1649 11/22/15 0418  NA 127*  --  135  K 4.7  --  4.6  CL 93*  --  103  CO2 19*  --  26  GLUCOSE 401*  --  302*  BUN 25*  --  17  CREATININE 1.76* 1.57* 1.51*  CALCIUM 9.2  --  8.5*   Liver Function Tests:  Recent Labs Lab 11/21/15 1310  11/22/15 0418  AST 15 12*  ALT 17 19  ALKPHOS 74 63  BILITOT 0.4 0.3  PROT 6.6 5.9*  ALBUMIN 2.9* 2.3*   CBC:  Recent Labs Lab 11/21/15 1310 11/21/15 1925 11/22/15 0418  WBC 11.5* 9.6 9.4  NEUTROABS 9.9*  --  7.4  HGB 12.7* 11.7* 10.9*  HCT 36.6* 33.8* 31.6*  MCV 100.5* 100.3* 101.3*  PLT 221 199 210   CBG:  Recent Labs Lab 11/21/15 1924 11/21/15 2227 11/22/15 0747 11/22/15 1133  GLUCAP 214* 369* 260* 360*   Hemoglobin A1C:  Recent Labs Lab 11/21/15 1649  HGBA1C 11.1*   Urinalysis:  Recent Labs Lab 11/21/15 1440  COLORURINE YELLOW  LABSPEC 1.017  PHURINE 5.5  GLUCOSEU 250*  HGBUR NEGATIVE  BILIRUBINUR NEGATIVE  KETONESUR NEGATIVE  PROTEINUR NEGATIVE  NITRITE NEGATIVE  LEUKOCYTESUR NEGATIVE   Micro Results: Recent Results (from the past 240 hour(s))  Culture, blood (routine x 2)     Status: None (Preliminary result)   Collection Time: 11/21/15  2:04 PM  Result Value Ref Range Status   Specimen Description RIGHT FOREARM  Final   Special Requests BOTTLES DRAWN AEROBIC AND ANAEROBIC 6ML  Final   Culture PENDING  Incomplete   Report Status PENDING  Incomplete  Urine culture     Status: None (Preliminary  result)   Collection Time: 11/21/15  2:40 PM  Result Value Ref Range Status   Specimen Description URINE, CLEAN CATCH  Final   Special Requests Immunocompromised  Final   Culture NO GROWTH < 24 HOURS  Final   Report Status PENDING  Incomplete   Studies/Results: Ct Abdomen Pelvis W Contrast  11/21/2015  CLINICAL DATA:  Groin swelling, perianal abscess, incision and drainage 6 days ago EXAM: CT ABDOMEN AND PELVIS WITH CONTRAST TECHNIQUE: Multidetector CT imaging of the abdomen and pelvis was performed using the standard protocol following bolus administration of intravenous contrast. CONTRAST:  100 ml OMNIPAQUE IOHEXOL 300 MG/ML  SOLN COMPARISON:  07/08/2015 FINDINGS: Lower chest:  Normal Hepatobiliary: Diffuse mild to moderate hepatic steatosis.  Otherwise negative. Pancreas: Normal Spleen: Normal Adrenals/Urinary Tract: Fullness of the left adrenal gland with mild nodularity similar to prior study. Bilateral moderate perinephric inflammatory change. This is also similar to the prior study and likely chronic. No hydronephrosis. Bladder negative. Stomach/Bowel: Mild diverticulosis throughout the large bowel. Nonobstructive bowel gas pattern. Normal appendix. Vascular/Lymphatic: Mild atherosclerotic calcification of the aortoiliac vessels. No significant adenopathy. Reproductive: Negative Other: There is air in the perineum extending into the left inguinal region. This process is not completely visualized, extending off the inferior edge of the scan volume. There is inflammatory change accompanying this. Musculoskeletal: No acute osseous abnormalities IMPRESSION: Soft tissue air extending from the perianal region through the left perineum into the left inguinal region with accompanying inflammation. The full caudal extent of the process is not included on the images. Air may indicate soft tissue infection. There may also be related to recent instrumentation with history of incision and drainage. CT follow-up suggested, with attention to imaging the full extent of the process. Electronically Signed   By: Esperanza Heir M.D.   On: 11/21/2015 15:53   Medications: I have reviewed the patient's current medications. Scheduled Meds: . aspirin EC  81 mg Oral Daily  . emtricitabine-tenofovir AF  1 tablet Oral Q breakfast  . enoxaparin (LOVENOX) injection  40 mg Subcutaneous Q24H  . insulin aspart  0-15 Units Subcutaneous TID WC  . insulin aspart  0-5 Units Subcutaneous QHS  . insulin glargine  45 Units Subcutaneous BID  . piperacillin-tazobactam (ZOSYN)  IV  3.375 g Intravenous 3 times per day  . pravastatin  40 mg Oral q1800  . rilpivirine  25 mg Oral Q breakfast  . vancomycin  750 mg Intravenous Q12H   Continuous Infusions: . sodium chloride 125  mL/hr at 11/22/15 1122   PRN Meds:.acetaminophen, promethazine Assessment/Plan: Principal Problem:   Sepsis (HCC) Active Problems:   Diabetes mellitus type 2, uncontrolled, with complications (HCC)   Hyperlipidemia   TOBACCO USER   Essential hypertension   HIV (human immunodeficiency virus infection) (HCC)   Perianal abscess  Sepsis 2/2 perianal abscess Patient is presenting with a 1 week history of a perianal abscess which was drained by his PCP 4 days ago. As per patient, the abscess is becoming progressively worse after the I&D despite being of Keflex. He does report a history of frequent abscesses which resolve spontaneously in a month. As per labs from 4 months ago, patient's HIV was well controlled - CD4 430 and HIV 1 RNA <20. Leukocytosis has now resolved. Lactic acid normal. Patient is not tachycardic but BP soft this morning (96/93). Urine culture showing no growth in <24 hours.  -Surgery on board, appreciate their recommendations  -Broad spectrum coverage of gram positives, gram negatives, and anaerobes with  Vancomycin and Zosyn -Iodoform packings  -Sitz baths  -NS  cc/hr -Pending wound culture -Pending blood culture -F/u am CBC  -F/u am BMP -Wound care consult -Orthostatic vitals   AKI Scr 1.76 on admission. Creatinine trended down to 1.51 today.  -Continue IV hydration -F/u am BMP  HIV  Labs from 4 months ago showing CD4 430 and HIV 1 RNA <20.  -cont home meds  -F/u CD4 and HIV 1 RNA   HLD -Pravachol 40 mg daily   Uncontrolled DM2: A1c 11.1. CBGs in the 200s-300s this morning.  -Lantus increased to 45 units BID -SSI -Hold home Glipizide and Metformin  -Monitor CBG  DVT ppx: Lovenox   Diet: heart healthy   Code: Full  Dispo: Disposition is deferred at this time, awaiting improvement of current medical problems.  Anticipated discharge in approximately 1-2 day(s).   The patient does have a current PCP (Pcp Not In System) and does need an [facility]  hospital follow-up appointment after discharge.  The patient does not have transportation limitations that hinder transportation to clinic appointments.  .Services Needed at time of discharge: Y = Yes, Blank = No PT:   OT:   RN:   Equipment:   Other:     LOS: 1 day   John Giovanni, MD 11/22/2015, 12:13 PM

## 2015-11-22 NOTE — Consult Note (Addendum)
WOC consult for abscess site requested prior to [facility] involvement.  They are now following for assessment and plan of care and have left orders for the bedside nurses regarding topical treatment. Please refer to the surgical team for further questions. Please re-consult if further assistance is needed.  Thank-you,  Cammie Mcgeeawn [place] MSN, RN, CWOCN, Se[place]iftWCN-AP, CNS [phone]

## 2015-11-22 NOTE — Care Management Note (Signed)
Case Management Note  Patient Details  Name: Harold Crosby MRN: [id number] Date of Birth: [birth year]  Subjective/Objective:                    Action/Plan:  Initial UR completed  Expected Discharge Date:  11/24/15               Expected Discharge Plan:  Home/Self Care  In-House Referral:     Discharge planning Services     Post Acute Care Choice:    Choice offered to:     DME Arranged:    DME Agency:     HH Arranged:    HH Agency:     Status of Service:  In process, will continue to follow  Medicare Important Message Given:    Date Medicare IM Given:    Medicare IM give by:    Date Additional Medicare IM Given:    Additional Medicare Important Message give by:     If discussed at Long Length of Stay Meetings, dates discussed:    Additional Comments:  Harold Crosby, Harold Heckert [name], RN 11/22/2015, 11:12 AM

## 2015-11-23 LAB — BASIC METABOLIC PANEL
Anion gap: 9 (ref 5–15)
BUN: 11 mg/dL (ref 6–20)
CO2: 24 mmol/L (ref 22–32)
Calcium: 8.9 mg/dL (ref 8.9–10.3)
Chloride: 105 mmol/L (ref 101–111)
Creatinine, Ser: 1.29 mg/dL — ABNORMAL HIGH (ref 0.61–1.24)
GFR calc Af Amer: >60 mL/min (ref >60)
GFR calc non Af Amer: >60 mL/min (ref >60)
GLUCOSE: 292 mg/dL — AB (ref 65–99)
POTASSIUM: 4.2 mmol/L (ref 3.5–5.1)
Sodium: 138 mmol/L (ref 135–145)

## 2015-11-23 LAB — CBC
HCT: 33.1 % — ABNORMAL LOW (ref 39.0–52.0)
Hemoglobin: 11.1 g/dL — ABNORMAL LOW (ref 13.0–17.0)
MCH: 34.2 pg — AB (ref 26.0–34.0)
MCHC: 33.5 g/dL (ref 30.0–36.0)
MCV: 101.8 fL — AB (ref 78.0–100.0)
PLATELETS: 266 10*3/uL (ref 150–400)
RBC: 3.25 MIL/uL — AB (ref 4.22–5.81)
RDW: 12.5 % (ref 11.5–15.5)
WBC: 6.7 10*3/uL (ref 4.0–10.5)

## 2015-11-23 LAB — GLUCOSE, CAPILLARY
GLUCOSE-CAPILLARY: 262 mg/dL — AB (ref 65–99)
GLUCOSE-CAPILLARY: 226 mg/dL — AB (ref 65–99)
GLUCOSE-CAPILLARY: 307 mg/dL — AB (ref 65–99)
GLUCOSE-CAPILLARY: 314 mg/dL — AB (ref 65–99)

## 2015-11-23 LAB — VANCOMYCIN, TROUGH: VANCOMYCIN TR: 7 ug/mL — AB (ref 10.0–20.0)

## 2015-11-23 MED ORDER — VANCOMYCIN HCL 10 G IV SOLR
1250.0000 mg | Freq: Two times a day (BID) | INTRAVENOUS | Status: DC
  Administered 2015-11-24: 1250 mg via INTRAVENOUS
  Filled 2015-11-23 (×2): qty 1250

## 2015-11-23 MED ORDER — ENOXAPARIN SODIUM 60 MG/0.6ML ~~LOC~~ SOLN
50.0000 mg | SUBCUTANEOUS | Status: DC
  Administered 2015-11-23: 50 mg via SUBCUTANEOUS
  Filled 2015-11-23: qty 0.6

## 2015-11-23 MED ORDER — INSULIN GLARGINE 100 UNIT/ML ~~LOC~~ SOLN
60.0000 [IU] | Freq: Two times a day (BID) | SUBCUTANEOUS | Status: DC
  Administered 2015-11-23 (×2): 60 [IU] via SUBCUTANEOUS
  Filled 2015-11-23 (×4): qty 0.6

## 2015-11-23 NOTE — Progress Notes (Signed)
Procedure note:   Further I&D left perirectal abscess:  Prior I&D left perirectal abscess, with ongoing drainage, Opening about 3 mm in diameter with ongoing drainage/pain left buttocks.  Area was cleaned with betadine swabs x 3.   10 ml of 2% plain lidocaine infiltrated around the open site.  After he was numb, we used a hemostat to explore and open up site.  We then opened site to a 1 cm length using #11 blade, with the hemostat as a guide.  A small 5 mm elliptical incision made to open site further.  It was about 2 cm deep.  We used direct pressure to control skin bleeding.  It was then packed with 1/4 inch iodoform.   We cleaned the skin with soap and water, then ABD was placed over site.  He tolerated the procedure well.  Bleeding was well controlled after the procedure.   Will [facility] Francis[facility]ennings PA-C  [facility] [phone]  11/23/2015 10:31 AM

## 2015-11-23 NOTE — Progress Notes (Signed)
Subjective: Still has pain and swelling around his abscess site. It was probed and packing was placed but packing fell out when he was taking shower. Denies having any fevers, chills, nausea, vomiting, or abdominal pain. No other complaints.    Objective: Vital signs in last 24 hours: Filed Vitals:   11/22/15 0505 11/22/15 1500 11/22/15 2124 11/23/15 0507  BP: 90/52 105/68 107/66 132/76  Pulse: 98 88 89 87  Temp: 99.5 F (37.5 C) 98.6 F (37 C) 99 F (37.2 C) 98.6 F (37 C)  TempSrc: Oral Oral Oral Oral  Resp: Height:  (1.854 m)     Weight: 231 lb 4.2 oz (104.9 kg)   231 lb 4.2 oz (104.9 kg)  SpO2: 96% 97% 97% 97%   Weight change: -3 lb 11.8 oz (-1.695 kg)  Intake/Output Summary (Last 24 hours) at 11/23/15 0834 Last data filed at 11/23/15 0548  Gross per 24 hour  Intake 3697.92 ml  Output      0 ml  Net 3697.92 ml   Physical Exam:  Constitutional: He is oriented to person, place, and time. He appears well-developed and well-nourished. No distress.  Cardiovascular: Normal rate, regular rhythm and intact distal pulses. Exam reveals no gallop and no friction rub.  No murmur heard. Pulmonary/Chest: Effort normal and breath sounds normal. No respiratory distress. He has no wheezes. He has no rales.  Abdominal: Soft. Bowel sounds are normal. He exhibits no distension. There is no tenderness. There is no rebound.  Musculoskeletal: He exhibits no edema.  Neurological: He is alert and oriented to person, place, and time.  Skin: Skin is warm and dry.  Perianal abscess: duration spontaneously draining pus, decreased in size about <1cm today. Area of induration extending to his scrotum. Erythema noted on scrotal region. There seems to be another hard fluid collection towards his left inguinal area.   Lab Results: Basic Metabolic Panel:  Recent Labs Lab 11/22/15 0418 11/23/15 0647  NA 135 138  K 4.6 4.2  CL 103 105  CO2 26 24  GLUCOSE 302* 292*  BUN 17  11  CREATININE 1.51* 1.29*  CALCIUM 8.5* 8.9   Liver Function Tests:  Recent Labs Lab 11/21/15 1310 11/22/15 0418  AST 15 12*  ALT 17 19  ALKPHOS 74 63  BILITOT 0.4 0.3  PROT 6.6 5.9*  ALBUMIN 2.9* 2.3*   CBC:  Recent Labs Lab 11/21/15 1310  11/22/15 0418 11/23/15 0647  WBC 11.5*  < > 9.4 6.7  NEUTROABS 9.9*  --  7.4  --   HGB 12.7*  < > 10.9* 11.1*  HCT 36.6*  < > 31.6* 33.1*  MCV 100.5*  < > 101.3* 101.8*  PLT 221  < > 210 266  < > = values in this interval not displayed. CBG:  Recent Labs Lab 11/21/15 2227 11/22/15 0747 11/22/15 1133 11/22/15 1725 11/22/15 2122 11/23/15 0751  GLUCAP 369* 260* 360* 355* 387* 262*   Hemoglobin A1C:  Recent Labs Lab 11/21/15 1649  HGBA1C 11.1*   Urinalysis:  Recent Labs Lab 11/21/15 1440  COLORURINE YELLOW  LABSPEC 1.017  PHURINE 5.5  GLUCOSEU 250*  HGBUR NEGATIVE  BILIRUBINUR NEGATIVE  KETONESUR NEGATIVE  PROTEINUR NEGATIVE  NITRITE NEGATIVE  LEUKOCYTESUR NEGATIVE   Micro Results: Recent Results (from the past 240 hour(s))  Culture, blood (routine x 2)     Status: None (Preliminary result)   Collection Time: 11/21/15  2:04 PM  Result Value Ref Range Status  Specimen Description RIGHT FOREARM  Final   Special Requests BOTTLES DRAWN AEROBIC AND ANAEROBIC  Final   Culture NO GROWTH < 24 HOURS  Final   Report Status PENDING  Incomplete  Culture, blood (routine x 2)     Status: None (Preliminary result)   Collection Time: 11/21/15  2:19 PM  Result Value Ref Range Status   Specimen Description BLOOD RIGHT ARM  Final   Special Requests BOTTLES DRAWN AEROBIC AND ANAEROBIC 5CC  Final   Culture NO GROWTH < 24 HOURS  Final   Report Status PENDING  Incomplete  Urine culture     Status: None   Collection Time: 11/21/15  2:40 PM  Result Value Ref Range Status   Specimen Description URINE, CLEAN CATCH  Final   Special Requests Immunocompromised  Final   Culture NO GROWTH 1 DAY  Final   Report Status  11/22/2015 FINAL  Final  Wound culture     Status: None (Preliminary result)   Collection Time: 11/22/15  6:44 PM  Result Value Ref Range Status   Specimen Description PERIRECTAL  Final   Special Requests Immunocompromised  Final   Gram Stain   Final    NO WBC SEEN NO SQUAMOUS EPITHELIAL CELLS SEEN ABUNDANT GRAM POSITIVE RODS MODERATE GRAM POSITIVE COCCI IN PAIRS IN CLUSTERS RARE GRAM NEGATIVE RODS    Culture PENDING  Incomplete   Report Status PENDING  Incomplete   Studies/Results: Ct Abdomen Pelvis W Contrast  11/21/2015  CLINICAL DATA:  Groin swelling, perianal abscess, incision and drainage 6 days ago EXAM: CT ABDOMEN AND PELVIS WITH CONTRAST TECHNIQUE: Multidetector CT imaging of the abdomen and pelvis was performed using the standard protocol following bolus administration of intravenous contrast. CONTRAST:  100 ml OMNIPAQUE IOHEXOL 300 MG/ML  SOLN COMPARISON:  07/08/2015 FINDINGS: Lower chest:  Normal Hepatobiliary: Diffuse mild to moderate hepatic steatosis. Otherwise negative. Pancreas: Normal Spleen: Normal Adrenals/Urinary Tract: Fullness of the left adrenal gland with mild nodularity similar to prior study. Bilateral moderate perinephric inflammatory change. This is also similar to the prior study and likely chronic. No hydronephrosis. Bladder negative. Stomach/Bowel: Mild diverticulosis throughout the large bowel. Nonobstructive bowel gas pattern. Normal appendix. Vascular/Lymphatic: Mild atherosclerotic calcification of the aortoiliac vessels. No significant adenopathy. Reproductive: Negative Other: There is air in the perineum extending into the left inguinal region. This process is not completely visualized, extending off the inferior edge of the scan volume. There is inflammatory change accompanying this. Musculoskeletal: No acute osseous abnormalities IMPRESSION: Soft tissue air extending from the perianal region through the left perineum into the left inguinal region with  accompanying inflammation. The full caudal extent of the process is not included on the images. Air may indicate soft tissue infection. There may also be related to recent instrumentation with history of incision and drainage. CT follow-up suggested, with attention to imaging the full extent of the process. Electronically Signed   By: Esperanza Heir M.D.   On: 11/21/2015 15:53   Medications: I have reviewed the patient's current medications. Scheduled Meds: . aspirin EC  81 mg Oral Daily  . emtricitabine-tenofovir AF  1 tablet Oral Q breakfast  . enoxaparin (LOVENOX) injection  40 mg Subcutaneous Q24H  . feeding supplement (GLUCERNA SHAKE)  237 mL Oral BID BM  . insulin aspart  0-15 Units Subcutaneous TID WC  . insulin aspart  0-5 Units Subcutaneous QHS  . insulin glargine  45 Units Subcutaneous BID  . multivitamin with minerals  1 tablet Oral Daily  .  nicotine  14 mg Transdermal Daily  . piperacillin-tazobactam (ZOSYN)  IV  3.375 g Intravenous 3 times per day  . pravastatin  40 mg Oral q1800  . rilpivirine  25 mg Oral Q breakfast  . vancomycin  750 mg Intravenous Q12H   Continuous Infusions: . sodium chloride 125 mL/hr at 11/23/15 0022   PRN Meds:.acetaminophen, HYDROmorphone (DILAUDID) injection, lidocaine, nicotine polacrilex, oxyCODONE-acetaminophen, promethazine Assessment/Plan: Principal Problem:   Sepsis (HCC) Active Problems:   Diabetes mellitus type 2, uncontrolled, with complications (HCC)   Hyperlipidemia   TOBACCO USER   Essential hypertension   HIV (human immunodeficiency virus infection) (HCC)   Perianal abscess  Sepsis 2/2 perianal abscess The site that was drained previously and is draining spontaneously, there is erythema tracking down in the scrotal and another adjacent area of fluid collection in the inguinal region.  - appreciate surgery recs: did a second I&D today to help better drain the abscess, put another packing material - appreciate wound care team for  their recs - wound cx growing GP Rods and GP cluster. Await final cultures - continue vanc+zosyn for now - continue fluids.   AKI Scr 1.76 on admission. Creatinine trended down to 1.2 today.  -d/c fluid as he is eating and seems euvolemic now. -F/u am BMP  HIV  Labs from 4 months ago showing CD4 430 and HIV 1 RNA <20. Now CD 4 280 (could be from acute illness) - f/up outpatient ID  HLD -Pravachol 40 mg daily   Uncontrolled DM2: A1c 11.1. CBGs in the 300s -Lantus increased to 60 units BID -SSI -Hold home Glipizide and Metformin  -Monitor CBG  DVT ppx: Lovenox   Diet: heart healthy   Code: Full  Dispo: Disposition is deferred at this time, awaiting improvement of current medical problems.  Anticipated discharge in approximately 1-2 day(s).   The patient does have a current PCP (Pcp Not In System) and [facility].  The patient does not have transportation limitations that hinder transportation to clinic appointments.  .Services Needed at time of discharge: Y = Yes, Blank = No PT:   OT:   RN:   Equipment:   Other:     LOS: 2 days   Hyacinth Meekerasrif Eriyana Sweeten, MD 11/23/2015, 8:34 AM

## 2015-11-23 NOTE — Progress Notes (Signed)
ANTIBIOTIC CONSULT NOTE - Follow Up  Pharmacy Consult for vancomycin Indication: cellulitis  Allergies  Allergen Reactions  . Sulfonamide Derivatives Anaphylaxis    REACTION: skin welps, tongue swells, throat closes up    Patient Measurements: Height: 6\' 1"  (185.4 cm) Weight: 231 lb 4.2 oz (104.9 kg) IBW/kg (Calculated) : 79.9   Vital Signs: Temp: 98.5 F (36.9 C) (11/29 1430) Temp Source: Oral (11/29 1430) BP: 111/59 mmHg (11/29 1430) Pulse Rate: 87 (11/29 1430) Intake/Output from previous day: 11/28 0701 - 11/29 0700 In: 3697.9 [P.O.:700; I.V.:2747.9; IV Piggyback:250] Out: -  Intake/Output from this shift: Total I/O In: 2400.4 [P.O.:240; I.V.:1210.4; IV Piggyback:950] Out: -   Labs:  Recent Labs  11/21/15 1649 11/21/15 1925 11/22/15 0418 11/23/15 0647  WBC  --  9.6 9.4 6.7  HGB  --  11.7* 10.9* 11.1*  PLT  --  199 210 266  CREATININE 1.57*  --  1.51* 1.29*   Estimated Creatinine Clearance: 91 mL/min (by C-G formula based on Cr of 1.29).  Recent Labs  11/23/15 1505  VANCOTROUGH 7*     Microbiology: Recent Results (from the past 720 hour(s))  Culture, blood (routine x 2)     Status: None (Preliminary result)   Collection Time: 11/21/15  2:04 PM  Result Value Ref Range Status   Specimen Description RIGHT FOREARM  Final   Special Requests BOTTLES DRAWN AEROBIC AND ANAEROBIC 6ML  Final   Culture NO GROWTH 2 DAYS  Final   Report Status PENDING  Incomplete  Culture, blood (routine x 2)     Status: None (Preliminary result)   Collection Time: 11/21/15  2:19 PM  Result Value Ref Range Status   Specimen Description BLOOD RIGHT ARM  Final   Special Requests BOTTLES DRAWN AEROBIC AND ANAEROBIC 5CC  Final   Culture NO GROWTH 2 DAYS  Final   Report Status PENDING  Incomplete  Urine culture     Status: None   Collection Time: 11/21/15  2:40 PM  Result Value Ref Range Status   Specimen Description URINE, CLEAN CATCH  Final   Special Requests  Immunocompromised  Final   Culture NO GROWTH 1 DAY  Final   Report Status 11/22/2015 FINAL  Final  Wound culture     Status: None (Preliminary result)   Collection Time: 11/22/15  6:44 PM  Result Value Ref Range Status   Specimen Description PERIRECTAL  Final   Special Requests Immunocompromised  Final   Gram Stain   Final    NO WBC SEEN NO SQUAMOUS EPITHELIAL CELLS SEEN ABUNDANT GRAM POSITIVE RODS MODERATE GRAM POSITIVE COCCI IN PAIRS IN CLUSTERS RARE GRAM NEGATIVE RODS    Culture PENDING  Incomplete   Report Status PENDING  Incomplete   Assessment: [age] YOM with HIV s/p I&D of groin abscess 5 days PTA. Was given Bactrim as outpt, but d/t sulfa allergy he did not take. Started on keflex 3 days PTA.  Abscess not improving and he presented to ED 11/27. Pharmacy consulted for Vanc and Zosyn to treat cellulitis.  SCr improved at 1.29, normalized CrCl improving to ~8075mL/min. WBC 6.7, afeb.  Vanc 11/27 >> Zosyn 11/27 >>  11/27 blood: ngtd 11/27 urine: neg 11/28 wound: GPR, GPC, GNR  A vancomycin trough drawn this evening was low at 617mcg/mL. There was some confusion as to whether this was a true trough since dose was charted as given by RN before phlebotomy reported the level as in process. See RN note for more details.  Goal of Therapy:  Vancomycin trough level 10-15 mcg/ml  Eradication of infection  Plan:  -Increase vancomycin to $Remo IV q12h starting at midnight -Zosyn 3.375 g IV q8h EI -f/u c/s, clinical progression, renal function   Kagan Mutchler D. Reis Pienta, PharmD, BCPS Clinical Pharmacist Pager: [phone] 11/23/2015 5:54 PM

## 2015-11-23 NOTE — Progress Notes (Signed)
Inpatient Diabetes Program Recommendations  AACE/ADA: New Consensus Statement on Inpatient Glycemic Control (2015)  Target Ranges:  Prepandial:   less than 140 mg/dL      Peak postprandial:   less than 180 mg/dL (1-2 hours)      Critically ill patients:  140 - 180 mg/dL   Review of Glycemic Control:  Results for Harold Crosby, Harold Crosby (MRN [id number]) as of 11/23/2015 14:09  Ref. Range 11/22/2015 11:33 11/22/2015 17:25 11/22/2015 21:22 11/23/2015 07:51 11/23/2015 12:57  Glucose-Capillary Latest Ref Range: 65-99 mg/dL 962360 (H) 952355 (H) 841387 (H) 262 (H) 226 (H)   Inpatient Diabetes Program Recommendations:   Note that Lantus increased to 60 units bid.  May consider reducing Lantus back to 45 units bid and adding Novolog meal coverage 15 units tid with meals.   Thanks, Beryl MeagerJenny Zsazsa Bahena, RN, BC-ADM Inpatient Diabetes Coordinator Pager [phone] (8a-5p)

## 2015-11-23 NOTE — Progress Notes (Signed)
Please note that the 11/29 1430 Vanc Trough was drawn prior to 1500 Vanc administration.  Phlebotomist and Patient contacted RN after blood draw, per RN's instructions, so that RN could hang Vanc after blood draw.  Pharmacy was notified twice that Vanc Trough was in fact drawn prior to Vanc administration.

## 2015-11-23 NOTE — Progress Notes (Signed)
Subjective: He says it's draining better last PM, but packing fell out.  No other complaints.   Objective: Vital signs in last 24 hours: Temp:  [98.6 F (37 C)-99 F (37.2 C)] 98.6 F (37 C) (11/29 0507) Pulse Rate:  [87-89] 87 (11/29 0507) Resp:  [18-20] 19 (11/29 0507) BP: (105-132)/(66-76) 132/76 mmHg (11/29 0507) SpO2:  [97 %] 97 % (11/29 0507) Weight:  [104.9 kg (231 lb 4.2 oz)] 104.9 kg (231 lb 4.2 oz) (11/29 0507) Last BM Date: 11/22/15 + BM Afebrile, VSS Creatinine is better, Glucose still up around 300 WBC normal  Intake/Output from previous day: 11/28 0701 - 11/29 0700 In: 3697.9 [P.O.:700; I.V.:2747.9; IV Piggyback:250] Out: -  Intake/Output this shift:    General appearance: alert, cooperative and no distress Skin: still draining from the site, no real erythema around open site.    Lab Results:   Recent Labs  11/22/15 0418 11/23/15 0647  WBC 9.4 6.7  HGB 10.9* 11.1*  HCT 31.6* 33.1*  PLT 210 266    BMET  Recent Labs  11/22/15 0418 11/23/15 0647  NA 135 138  K 4.6 4.2  CL 103 105  CO2 26 24  GLUCOSE 302* 292*  BUN 17 11  CREATININE 1.51* 1.29*  CALCIUM 8.5* 8.9   PT/INR No results for input(s): LABPROT, INR in the last 72 hours.   Recent Labs Lab 11/21/15 1310 11/22/15 0418  AST 15 12*  ALT 17 19  ALKPHOS 74 63  BILITOT 0.4 0.3  PROT 6.6 5.9*  ALBUMIN 2.9* 2.3*     Lipase  No results found for: LIPASE   Studies/Results: Ct Abdomen Pelvis W Contrast  11/21/2015  CLINICAL DATA:  Groin swelling, perianal abscess, incision and drainage 6 days ago EXAM: CT ABDOMEN AND PELVIS WITH CONTRAST TECHNIQUE: Multidetector CT imaging of the abdomen and pelvis was performed using the standard protocol following bolus administration of intravenous contrast. CONTRAST:  100 ml OMNIPAQUE IOHEXOL 300 MG/ML  SOLN COMPARISON:  07/08/2015 FINDINGS: Lower chest:  Normal Hepatobiliary: Diffuse mild to moderate hepatic steatosis. Otherwise negative.  Pancreas: Normal Spleen: Normal Adrenals/Urinary Tract: Fullness of the left adrenal gland with mild nodularity similar to prior study. Bilateral moderate perinephric inflammatory change. This is also similar to the prior study and likely chronic. No hydronephrosis. Bladder negative. Stomach/Bowel: Mild diverticulosis throughout the large bowel. Nonobstructive bowel gas pattern. Normal appendix. Vascular/Lymphatic: Mild atherosclerotic calcification of the aortoiliac vessels. No significant adenopathy. Reproductive: Negative Other: There is air in the perineum extending into the left inguinal region. This process is not completely visualized, extending off the inferior edge of the scan volume. There is inflammatory change accompanying this. Musculoskeletal: No acute osseous abnormalities IMPRESSION: Soft tissue air extending from the perianal region through the left perineum into the left inguinal region with accompanying inflammation. The full caudal extent of the process is not included on the images. Air may indicate soft tissue infection. There may also be related to recent instrumentation with history of incision and drainage. CT follow-up suggested, with attention to imaging the full extent of the process. Electronically Signed   By: Esperanza Heir M.D.   On: 11/21/2015 15:53    Medications: . aspirin EC  81 mg Oral Daily  . emtricitabine-tenofovir AF  1 tablet Oral Q breakfast  . enoxaparin (LOVENOX) injection  40 mg Subcutaneous Q24H  . feeding supplement (GLUCERNA SHAKE)  237 mL Oral BID BM  . insulin aspart  0-15 Units Subcutaneous TID WC  . insulin aspart  0-5 Units Subcutaneous QHS  . insulin glargine  45 Units Subcutaneous BID  . multivitamin with minerals  1 tablet Oral Daily  . nicotine  14 mg Transdermal Daily  . piperacillin-tazobactam (ZOSYN)  IV  3.375 g Intravenous 3 times per day  . pravastatin  40 mg Oral q1800  . rilpivirine  25 mg Oral Q breakfast  . vancomycin  750 mg  Intravenous Q12H    Assessment/Plan 2 cm Left Perirectal abscess with air extending into the left perineium S/P I&D Left perirectal abscess with cellulitis  HIV reportedly controlled AODM Poor control He reports glucose runs around 300 at home. A1C 11.1 Tobacco use 2PPD 25 years, down to 1PPD Hypertension Hyperlipidemia Body mass index is 30.5 Antibiotics: day 3 Vancomycin and Zosyn DVT:  Lovenox/SCD  Plan:  We will work on opening up the site more today.    LOS: 2 days    Hatsue Sime 11/23/2015

## 2015-11-24 DIAGNOSIS — K610 Anal abscess: Secondary | ICD-10-CM

## 2015-11-24 DIAGNOSIS — E1165 Type 2 diabetes mellitus with hyperglycemia: Secondary | ICD-10-CM

## 2015-11-24 DIAGNOSIS — Z21 Asymptomatic human immunodeficiency virus [HIV] infection status: Secondary | ICD-10-CM

## 2015-11-24 DIAGNOSIS — A419 Sepsis, unspecified organism: Principal | ICD-10-CM

## 2015-11-24 DIAGNOSIS — Z794 Long term (current) use of insulin: Secondary | ICD-10-CM

## 2015-11-24 LAB — BASIC METABOLIC PANEL
ANION GAP: 6 (ref 5–15)
BUN: 12 mg/dL (ref 6–20)
CALCIUM: 8.9 mg/dL (ref 8.9–10.3)
CO2: 28 mmol/L (ref 22–32)
Chloride: 102 mmol/L (ref 101–111)
Creatinine, Ser: 1.38 mg/dL — ABNORMAL HIGH (ref 0.61–1.24)
GFR, EST NON AFRICAN AMERICAN: 60 mL/min — AB (ref >60)
Glucose, Bld: 368 mg/dL — ABNORMAL HIGH (ref 65–99)
POTASSIUM: 4.6 mmol/L (ref 3.5–5.1)
Sodium: 136 mmol/L (ref 135–145)

## 2015-11-24 LAB — GLUCOSE, CAPILLARY
GLUCOSE-CAPILLARY: 320 mg/dL — AB (ref 65–99)
GLUCOSE-CAPILLARY: 275 mg/dL — AB (ref 65–99)

## 2015-11-24 MED ORDER — INSULIN ASPART 100 UNIT/ML ~~LOC~~ SOLN: 15.0000 [IU] | Freq: Three times a day (TID) | SUBCUTANEOUS | Status: DC

## 2015-11-24 MED ORDER — INSULIN GLARGINE 100 UNIT/ML ~~LOC~~ SOLN: 45.0000 [IU] | Freq: Two times a day (BID) | SUBCUTANEOUS | Status: DC

## 2015-11-24 MED ORDER — AMOXICILLIN-POT CLAVULANATE 875-125 MG PO TABS: 1.0000 | ORAL_TABLET | Freq: Two times a day (BID) | ORAL | Status: DC

## 2015-11-24 MED ORDER — INSULIN ASPART 100 UNIT/ML ~~LOC~~ SOLN
15.0000 [IU] | Freq: Three times a day (TID) | SUBCUTANEOUS | Status: DC
  Administered 2015-11-24: 15 [IU] via SUBCUTANEOUS

## 2015-11-24 MED ORDER — OXYCODONE-ACETAMINOPHEN 5-325 MG PO TABS: 1.0000 | ORAL_TABLET | Freq: Four times a day (QID) | ORAL | Status: DC | PRN

## 2015-11-24 MED ORDER — AMOXICILLIN-POT CLAVULANATE 875-125 MG PO TABS
1.0000 | ORAL_TABLET | Freq: Two times a day (BID) | ORAL | Status: DC
  Administered 2015-11-24: 1 via ORAL
  Filled 2015-11-24: qty 1

## 2015-11-24 MED ORDER — INSULIN GLARGINE 100 UNIT/ML ~~LOC~~ SOLN
45.0000 [IU] | Freq: Two times a day (BID) | SUBCUTANEOUS | Status: DC
  Administered 2015-11-24: 45 [IU] via SUBCUTANEOUS
  Filled 2015-11-24 (×2): qty 0.45

## 2015-11-24 MED ORDER — INSULIN GLARGINE 100 UNIT/ML ~~LOC~~ SOLN: 45.0000 [IU] | Freq: Two times a day (BID) | SUBCUTANEOUS | Status: AC

## 2015-11-24 NOTE — Discharge Instructions (Addendum)
-   Take Augmentin 875 mg: 1 tablet by mouth twice daily for 7 days  -Take Percocet 5-325 mg (as instructed) for pain  - Shower daily and after each bowel movement. Remember to apply new dry dressing everytime.   -Take Lantus 45 units twice a day  -Take Novolog 15 units three time a day before meals

## 2015-11-24 NOTE — Progress Notes (Signed)
Patient ID: Harold Crosby, male   DOB: [birth year], 44 y.o.   MRN: [id number]     [facility]      [address] [place], [place] [address]    Phone: [phone] FAX: [phone]     Subjective: Dressing changes started. Afebrile.   CBGs are high, poorly controlled diabetic.    Objective:  Vital signs:  Filed Vitals:   11/23/15 1430 11/23/15 2118 11/24/15 0500 11/24/15 0630  BP: 111/59 119/67  104/54  Pulse: 87 89  74  Temp: 98.5 F (36.9 C) 98.6 F (37 C)  98 F (36.7 C)  TempSrc: Oral Oral  Oral  Resp: $Remo'20 18  18  'Kscpn$ Height:      Weight:   104.2 kg (229 lb 11.5 oz)   SpO2: 98% 96%  97%    Last BM Date: 11/23/15  Intake/Output   Yesterday:  11/29 0701 - 11/30 0700 In: 3010.4 [P.O.:800; I.V.:1210.4; IV Piggyback:1000] Out: -  This shift:    I/O last 3 completed shifts: In: 4680.4 [P.O.:1020; I.V.:2660.4; IV Piggyback:1000] Out: -    Physical Exam: General: Pt awake/alert/oriented x4 in no acute distress Skin: small left perirectal wound open, draining adequately, purulent, no surrounding erythema or induration.    Problem List:   Principal Problem:   Sepsis ([facility]) Active Problems:   Diabetes mellitus type 2, uncontrolled, with complications ([place])   Hyperlipidemia   TOBACCO USER   Essential hypertension   HIV (human immunodeficiency virus infection) ([place])   Perianal abscess    Results:   Labs: Results for orders placed or performed during the hospital encounter of 11/21/15 (from the past 48 hour(s))  Glucose, capillary     Status: Abnormal   Collection Time: 11/22/15 11:33 AM  Result Value Ref Range   Glucose-Capillary 360 (H) 65 - 99 mg/dL   Comment 1 Notify RN   Glucose, capillary     Status: Abnormal   Collection Time: 11/22/15  5:25 PM  Result Value Ref Range   Glucose-Capillary 355 (H) 65 - 99 mg/dL   Comment 1 Notify RN   Wound culture     Status: None (Preliminary result)   Collection Time:  11/22/15  6:44 PM  Result Value Ref Range   Specimen Description PERIRECTAL    Special Requests Immunocompromised    Gram Stain      NO WBC SEEN NO SQUAMOUS EPITHELIAL CELLS SEEN ABUNDANT GRAM POSITIVE RODS MODERATE GRAM POSITIVE COCCI IN PAIRS IN CLUSTERS RARE GRAM NEGATIVE RODS    Culture      MODERATE ENTEROCOCCUS SPECIES Performed at [facility]    Report Status PENDING   Glucose, capillary     Status: Abnormal   Collection Time: 11/22/15  9:22 PM  Result Value Ref Range   Glucose-Capillary 387 (H) 65 - 99 mg/dL   Comment 1 Notify RN   CBC     Status: Abnormal   Collection Time: 11/23/15  6:47 AM  Result Value Ref Range   WBC 6.7 4.0 - 10.5 K/uL   RBC 3.25 (L) 4.22 - 5.81 MIL/uL   Hemoglobin 11.1 (L) 13.0 - 17.0 g/dL   HCT 33.1 (L) 39.0 - 52.0 %   MCV 101.8 (H) 78.0 - 100.0 fL   MCH 34.2 (H) 26.0 - 34.0 pg   MCHC 33.5 30.0 - 36.0 g/dL   RDW 12.5 11.5 - 15.5 %   Platelets 266 150 - 400 K/uL  Basic metabolic panel  Status: Abnormal   Collection Time: 11/23/15  6:47 AM  Result Value Ref Range   Sodium 138 135 - 145 mmol/L   Potassium 4.2 3.5 - 5.1 mmol/L   Chloride 105 101 - 111 mmol/L   CO2 24 22 - 32 mmol/L   Glucose, Bld 292 (H) 65 - 99 mg/dL   BUN 11 6 - 20 mg/dL   Creatinine, Ser 1.29 (H) 0.61 - 1.24 mg/dL   Calcium 8.9 8.9 - 10.3 mg/dL   GFR calc non Af Amer >60 >60 mL/min   GFR calc Af Amer >60 >60 mL/min    Comment: (NOTE) The eGFR has been calculated using the CKD EPI equation. This calculation has not been validated in all clinical situations. eGFR's persistently <60 mL/min signify possible Chronic Kidney Disease.    Anion gap 9 5 - 15  Glucose, capillary     Status: Abnormal   Collection Time: 11/23/15  7:51 AM  Result Value Ref Range   Glucose-Capillary 262 (H) 65 - 99 mg/dL   Comment 1 Notify RN   Glucose, capillary     Status: Abnormal   Collection Time: 11/23/15 12:57 PM  Result Value Ref Range   Glucose-Capillary 226 (H) 65 -  99 mg/dL  Vancomycin, trough     Status: Abnormal   Collection Time: 11/23/15  3:05 PM  Result Value Ref Range   Vancomycin Tr 7 (L) 10.0 - 20.0 ug/mL  Glucose, capillary     Status: Abnormal   Collection Time: 11/23/15  4:59 PM  Result Value Ref Range   Glucose-Capillary 307 (H) 65 - 99 mg/dL   Comment 1 Notify RN   Glucose, capillary     Status: Abnormal   Collection Time: 11/23/15  9:15 PM  Result Value Ref Range   Glucose-Capillary 314 (H) 65 - 99 mg/dL   Comment 1 Notify RN   Basic metabolic panel     Status: Abnormal   Collection Time: 11/24/15  3:38 AM  Result Value Ref Range   Sodium 136 135 - 145 mmol/L   Potassium 4.6 3.5 - 5.1 mmol/L   Chloride 102 101 - 111 mmol/L   CO2 28 22 - 32 mmol/L   Glucose, Bld 368 (H) 65 - 99 mg/dL   BUN 12 6 - 20 mg/dL   Creatinine, Ser 1.38 (H) 0.61 - 1.24 mg/dL   Calcium 8.9 8.9 - 10.3 mg/dL   GFR calc non Af Amer 60 (L) >60 mL/min   GFR calc Af Amer >60 >60 mL/min    Comment: (NOTE) The eGFR has been calculated using the CKD EPI equation. This calculation has not been validated in all clinical situations. eGFR's persistently <60 mL/min signify possible Chronic Kidney Disease.    Anion gap 6 5 - 15  Glucose, capillary     Status: Abnormal   Collection Time: 11/24/15  7:28 AM  Result Value Ref Range   Glucose-Capillary 320 (H) 65 - 99 mg/dL    Imaging / Studies: No results found.  Medications / Allergies:  Scheduled Meds: . aspirin EC  81 mg Oral Daily  . emtricitabine-tenofovir AF  1 tablet Oral Q breakfast  . enoxaparin (LOVENOX) injection  50 mg Subcutaneous Q24H  . feeding supplement (GLUCERNA SHAKE)  237 mL Oral BID BM  . insulin aspart  0-15 Units Subcutaneous TID WC  . insulin aspart  0-5 Units Subcutaneous QHS  . insulin glargine  60 Units Subcutaneous BID  . multivitamin with minerals  1 tablet Oral  Daily  . nicotine  14 mg Transdermal Daily  . piperacillin-tazobactam (ZOSYN)  IV  3.375 g Intravenous 3 times per  day  . pravastatin  40 mg Oral q1800  . rilpivirine  25 mg Oral Q breakfast  . vancomycin  1,250 mg Intravenous Q12H   Continuous Infusions:  PRN Meds:.acetaminophen, HYDROmorphone (DILAUDID) injection, lidocaine, nicotine polacrilex, oxyCODONE-acetaminophen, promethazine  Antibiotics: Anti-infectives    Start     Dose/Rate Route Frequency Ordered Stop   11/24/15 0000  vancomycin (VANCOCIN) 1,250 mg in sodium chloride 0.9 % 250 mL IVPB     1,250 mg 166.7 mL/hr over 90 Minutes Intravenous Every 12 hours 11/23/15 1758     11/22/15 0900  piperacillin-tazobactam (ZOSYN) IVPB 3.375 g     3.375 g 12.5 mL/hr over 240 Minutes Intravenous 3 times per day 11/22/15 0826     11/22/15 0800  emtricitabine-rilpivir-tenofovir AF (ODEFSEY) 200-25-25 MG per tablet 1 tablet  Status:  Discontinued     1 tablet Oral Daily with breakfast 11/21/15 1636 11/21/15 1843   11/22/15 0800  rilpivirine (EDURANT) tablet 25 mg     25 mg Oral Daily with breakfast 11/21/15 1843     11/22/15 0800  emtricitabine-tenofovir AF (DESCOVY) 200-25 MG per tablet 1 tablet     1 tablet Oral Daily with breakfast 11/21/15 1843     11/22/15 0300  vancomycin (VANCOCIN) IVPB 750 mg/150 ml premix  Status:  Discontinued     750 mg 150 mL/hr over 60 Minutes Intravenous Every 12 hours 11/21/15 1450 11/23/15 1758   11/21/15 1415  vancomycin (VANCOCIN) 1,500 mg in sodium chloride 0.9 % 500 mL IVPB     1,500 mg 250 mL/hr over 120 Minutes Intravenous  Once 11/21/15 1413 11/21/15 1828   11/21/15 1400  piperacillin-tazobactam (ZOSYN) IVPB 3.375 g     3.375 g 100 mL/hr over 30 Minutes Intravenous  Once 11/21/15 1351 11/21/15 1532        Assessment/Plan POD#1 I&D left perirectal abscess Stable for DC from a surgical standpoint Shower daily and after each bowel movement Apply dry dressing as it is too difficult to pack High risk of recurrence given poorly controlled diabetes Can change to PO antibiotics to complete a 5-7 day course May  follow up in our office if needed     Erby Pian, [facility] Pager [phone](7A-4:30P) For consults and floor pages call [phone](7A-4:30P)  11/24/2015 8:17 AM

## 2015-11-24 NOTE — Progress Notes (Signed)
Patient discharged to home with instructions. 

## 2015-11-24 NOTE — Discharge Summary (Signed)
Name: Harold Crosby MRN: [id number] DOB: 12/20/1971 44 y.o. PCP: Pcp Not In System  Date of Admission: 11/21/2015  1:20 PM Date of Discharge: 11/25/2015 Attending Physician: No att. providers found  Discharge Diagnosis: 1.  Principal Problem:   Sepsis ([facility]) Active Problems:   Diabetes mellitus type 2, uncontrolled, with complications ([facility])   Hyperlipidemia   TOBACCO USER   Essential hypertension   HIV (human immunodeficiency virus infection) ([facility])   Perianal abscess  Discharge Medications:   Medication List    TAKE these medications        amoxicillin-clavulanate 875-125 MG tablet  Commonly known as:  AUGMENTIN  Take 1 tablet by mouth every 12 (twelve) hours.     aspirin 81 MG tablet  Take 81 mg by mouth daily.     emtricitabine-rilpivir-tenofovir AF 200-25-25 MG Tabs tablet  Commonly known as:  ODEFSEY  Take 1 tablet by mouth daily with breakfast.     glipiZIDE 10 MG tablet  Commonly known as:  GLUCOTROL  Take 1 tablet (10 mg total) by mouth 2 (two) times daily before a meal.     insulin aspart 100 UNIT/ML injection  Commonly known as:  novoLOG  Inject 15 Units into the skin 3 (three) times daily with meals.     insulin glargine 100 UNIT/ML injection  Commonly known as:  LANTUS  Inject 0.45 mLs (45 Units total) into the skin 2 (two) times daily.     lisinopril-hydrochlorothiazide 20-25 MG tablet  Commonly known as:  PRINZIDE,ZESTORETIC  TAKE 1 TABLET BY MOUTH DAILY     metFORMIN 1000 MG tablet  Commonly known as:  GLUCOPHAGE  Take 1 tablet (1,000 mg total) by mouth 2 (two) times daily with a meal.     oxyCODONE-acetaminophen 5-325 MG tablet  Commonly known as:  PERCOCET/ROXICET  Take 1 tablet by mouth every 6 (six) hours as needed for moderate pain.     pravastatin 40 MG tablet  Commonly known as:  PRAVACHOL  Take 1 tablet (40 mg total) by mouth daily.        Disposition and follow-up:   Har[facility] Cros[facility].  At the hospital follow up visit please address:  1.  Perianal abcess - has it resolved? Did he finish his 7 day course of Augmentin?   Uncontrolled DM2 Blood sugars in the 400s on admission. A1c 11.1. Patient's CBGs were in the 200s-300s during the hospitalization. He was discharged with Lantus 45 u BID and Novolog 15 u TID. CBG 275 upon discharge.  -Please discuss diabetes medication regimen and medication compliance with the patient. -Repeat A1c in 3 months  HIV Labs from 4 months ago showing CD4 430 and HIV 1 RNA <20. Now CD 4 280.  -He needs to follow-up with infectious disease as outpatient.   2.  Labs / imaging needed at time of follow-up:  3.  Pending labs/ test needing follow-up: blood culture  Follow-up Appointments:     Follow-up Information    Follow up with Jearld Leschwight M Williams, MD. Schedule an appointment as soon as possible for a visit in 2 weeks.   Specialty:  Family Medicine   Why:  Follow up   Contact information:   [address] [place] [address] [phone]       Follow up with Marigene Ehlersamirez Jr., Jed LimerickArmando, MD. Schedule an appointment as soon as possible for a visit in 2 weeks.   Specialty:  General  Surgery   Why:  Follow up   Contact information:   [address] [place] [address] [phone]       Discharge Instructions: Discharge Instructions    Diet - low sodium heart healthy    Complete by:  As directed      Discharge instructions    Complete by:  As directed   Change dressing daily with gauze and shower after each bowel movement.  Take Augmentin for 7 days. Take Lantus 45 units twice a day and Novolog 15 units three times a day before meals.  Follow up with your primary care doctor.     Increase activity slowly    Complete by:  As directed            Consultations:    Dr. Derrell Lolling ([facility])   Procedures Performed:  Ct Abdomen Pelvis W Contrast  11/21/2015  CLINICAL DATA:   Groin swelling, perianal abscess, incision and drainage 6 days ago EXAM: CT ABDOMEN AND PELVIS WITH CONTRAST TECHNIQUE: Multidetector CT imaging of the abdomen and pelvis was performed using the standard protocol following bolus administration of intravenous contrast. CONTRAST:  100 ml OMNIPAQUE IOHEXOL 300 MG/ML  SOLN COMPARISON:  07/08/2015 FINDINGS: Lower chest:  Normal Hepatobiliary: Diffuse mild to moderate hepatic steatosis. Otherwise negative. Pancreas: Normal Spleen: Normal Adrenals/Urinary Tract: Fullness of the left adrenal gland with mild nodularity similar to prior study. Bilateral moderate perinephric inflammatory change. This is also similar to the prior study and likely chronic. No hydronephrosis. Bladder negative. Stomach/Bowel: Mild diverticulosis throughout the large bowel. Nonobstructive bowel gas pattern. Normal appendix. Vascular/Lymphatic: Mild atherosclerotic calcification of the aortoiliac vessels. No significant adenopathy. Reproductive: Negative Other: There is air in the perineum extending into the left inguinal region. This process is not completely visualized, extending off the inferior edge of the scan volume. There is inflammatory change accompanying this. Musculoskeletal: No acute osseous abnormalities IMPRESSION: Soft tissue air extending from the perianal region through the left perineum into the left inguinal region with accompanying inflammation. The full caudal extent of the process is not included on the images. Air may indicate soft tissue infection. There may also be related to recent instrumentation with history of incision and drainage. CT follow-up suggested, with attention to imaging the full extent of the process. Electronically Signed   By: Esperanza Heir M.D.   On: 11/21/2015 15:53   Admission HPI: Patient is a 44 year old Caucasian male with a past medical history of HIV, DM 2, hypertension, hyperlipidemia presenting to [facility] with the chief complaint  of "boils." States he first noticed this "boil" in his buttock/ anal region about 1 week ago and went to his PCP on Wednesday (11/17/15). States his PCP performed an I&D that day and 2 days later he was prescribed an antibiotic (Keflex). Also reports having associated pain in the area, fevers, nausea, and vomiting since Wednesday night. States the "boil" got bigger in size after the I&D procedure and now has to spread from his buttock region to the groin. Denies having pain with defecation, blood per rectum, or dysuria. Denies shaving the area or any history of cuts in the area. Patient does report having a two-year history of frequent "boils" which resolve spontaneously within a month, most recent 1.5 weeks ago on his right lower back. States he normally "pops them with a pen" but did not do that for the buttock/ groin area.   Patient reports being compliant his HIV medications and following  up with Dr. [name] regularly.   Hospital Course by problem list: Principal Problem:   Sepsis ([facility]) Active Problems:   Diabetes mellitus type 2, uncontrolled, with complications ([facility])   Hyperlipidemia   TOBACCO USER   Essential hypertension   HIV (human immunodeficiency virus infection) ([facility])   Perianal abscess    Sepsis 2/2 perianal abscess Patient was afebrile on admission but had a white count 11.5 and lactic acid 3.75. Additionally, he was tachycardic (HR 100) and BP soft (96/93). There was grossly purulent exudate coming from the abscess that had recently been incised 4 days prior to date of admission. Inflammation extending to the scrotal area. CT scan consistent with soft tissue infection extending from the perianal region through the left perineum into the left inguinal region with accompanying inflammation. Blood and wound cultures were drawn and patient received broad spectrum coverage with antibiotics Vanc and Zosyn. Leukocytosis resolved and lactic acid normalized the following day. Surgery was on  board. They tried iodoform packings initially but then performed bedside I&D on 11/29. Wound cx growing GP Rods, GP cocci (enterococcus species), and GN rods. The abscess appeared significantly reduced in size s/p I&D. On the day of discharge, patient was afebrile and no systemic signs of infection. Blood cx showing no growth in 3 days. IV antibiotics stopped and he was transitioned to PO antibiotic Augmentin 875 mg BID x 7 days. He was instructed to keep his wound clean and dry. Simple 4 x 4 gauze pads between the buttocks. He is to follow-up with his PCP in 2 weeks and with surgery as needed.   AKI Scr 1.76 on admission. SCr was 1.8 in 06/2015. Patient was hydrated with IV fluids and SCr improved to 1.3 by the day of discharge.   Uncontrolled DM2 Blood sugars in the 400s on admission. A1c 11.1. Patient's CBGs were in the 200s-300s during the hospitalization. He was discharged with Lantus 45 u BID and Novolog 15 u TID. CBG 275 upon discharge. He is to follow-up with PCP within 2 weeks of discharge.   HIV Labs from 4 months ago showing CD4 430 and HIV 1 RNA <20. Now CD 4 280 (could be from acute illness). He is to follow-up with infectious disease as outpatient.     Discharge Vitals:   BP 126/80 mmHg  Pulse 72  Temp(Src) 97.9 F (36.6 C) (Oral)  Resp 16  Ht  (1.854 m)  Wt 104.2 kg (229 lb 11.5 oz)  BMI 30.31 kg/m2  SpO2 99%  Discharge Labs:  No results found for this or any previous visit (from the past 24 hour(s)).  Signed: John Giovanni, MD 11/25/2015, 10:51 PM    Services Ordered on Discharge:  Equipment Ordered on Discharge:

## 2015-11-24 NOTE — Progress Notes (Signed)
Subjective: Patient had I&D of the abscess site yesterday (11/13/15). States he is doing well and pain is well controled with medications. States the packing fell out when he was taking shower and at present he has a gauze dressing at the site. Denies having any fevers, chills, nausea, vomiting, or abdominal pain. No other complaints.    Objective: Vital signs in last 24 hours: Filed Vitals:   11/23/15 2118 11/24/15 0500 11/24/15 0630 11/24/15 1309  BP: 119/67  104/54 126/80  Pulse: 89  74 72  Temp: 98.6 F (37 C)  98 F (36.7 C) 97.9 F (36.6 C)  TempSrc: Oral  Oral Oral  Resp: Height:      Weight:  104.2 kg (229 lb 11.5 oz)    SpO2: 96%  97% 99%   Weight change: -0.7 kg (-1 lb 8.7 oz)  Intake/Output Summary (Last 24 hours) at 11/24/15 1411 Last data filed at 11/24/15 0910  Gross per 24 hour  Intake   1000 ml  Output      0 ml  Net   1000 ml   Physical Exam:  Constitutional: He is oriented to person, place, and time. He appears well-developed and well-nourished. No distress.  Cardiovascular: Normal rate, regular rhythm and intact distal pulses. Exam reveals no gallop and no friction rub.  No murmur heard. Pulmonary/Chest: Effort normal and breath sounds normal. No respiratory distress. He has no wheezes. He has no rales.  Abdominal: Soft. Bowel sounds are normal. He exhibits no distension. There is no tenderness. There is no rebound.  Musculoskeletal: He exhibits no edema.  Neurological: He is alert and oriented to person, place, and time.  Skin: Skin is warm and dry.  Perianal abscess: Area was covered with a gauze dressing. Seems to be significantly decreased in size today and no purulent drainage noted. No erythema noted.    Lab Results: Basic Metabolic Panel:  Recent Labs Lab 11/23/15 0647 11/24/15 0338  NA 138 136  K 4.2 4.6  CL 105 102  CO2 24 28  GLUCOSE 292* 368*  BUN 11 12  CREATININE 1.29* 1.38*  CALCIUM 8.9 8.9   Liver Function  Tests:  Recent Labs Lab 11/21/15 1310 11/22/15 0418  AST 15 12*  ALT 17 19  ALKPHOS 74 63  BILITOT 0.4 0.3  PROT 6.6 5.9*  ALBUMIN 2.9* 2.3*   CBC:  Recent Labs Lab 11/21/15 1310  11/22/15 0418 11/23/15 0647  WBC 11.5*  < > 9.4 6.7  NEUTROABS 9.9*  --  7.4  --   HGB 12.7*  < > 10.9* 11.1*  HCT 36.6*  < > 31.6* 33.1*  MCV 100.5*  < > 101.3* 101.8*  PLT 221  < > 210 266  < > = values in this interval not displayed. CBG:  Recent Labs Lab 11/23/15 0751 11/23/15 1257 11/23/15 1659 11/23/15 2115 11/24/15 0728 11/24/15 1213  GLUCAP 262* 226* 307* 314* 320* 275*   Hemoglobin A1C:  Recent Labs Lab 11/21/15 1649  HGBA1C 11.1*   Urinalysis:  Recent Labs Lab 11/21/15 1440  COLORURINE YELLOW  LABSPEC 1.017  PHURINE 5.5  GLUCOSEU 250*  HGBUR NEGATIVE  BILIRUBINUR NEGATIVE  KETONESUR NEGATIVE  PROTEINUR NEGATIVE  NITRITE NEGATIVE  LEUKOCYTESUR NEGATIVE   Micro Results: Recent Results (from the past 240 hour(s))  Culture, blood (routine x 2)     Status: None (Preliminary result)   Collection Time: 11/21/15  2:04 PM  Result Value Ref Range Status  Specimen Description RIGHT FOREARM  Final   Special Requests BOTTLES DRAWN AEROBIC AND ANAEROBIC 6ML  Final   Culture NO GROWTH 3 DAYS  Final   Report Status PENDING  Incomplete  Culture, blood (routine x 2)     Status: None (Preliminary result)   Collection Time: 11/21/15  2:19 PM  Result Value Ref Range Status   Specimen Description BLOOD RIGHT ARM  Final   Special Requests BOTTLES DRAWN AEROBIC AND ANAEROBIC 5CC  Final   Culture NO GROWTH 3 DAYS  Final   Report Status PENDING  Incomplete  Urine culture     Status: None   Collection Time: 11/21/15  2:40 PM  Result Value Ref Range Status   Specimen Description URINE, CLEAN CATCH  Final   Special Requests Immunocompromised  Final   Culture NO GROWTH 1 DAY  Final   Report Status 11/22/2015 FINAL  Final  Wound culture     Status: None (Preliminary result)     Collection Time: 11/22/15  6:44 PM  Result Value Ref Range Status   Specimen Description PERIRECTAL  Final   Special Requests Immunocompromised  Final   Gram Stain   Final    NO WBC SEEN NO SQUAMOUS EPITHELIAL CELLS SEEN ABUNDANT GRAM POSITIVE RODS MODERATE GRAM POSITIVE COCCI IN PAIRS IN CLUSTERS RARE GRAM NEGATIVE RODS    Culture   Final    MODERATE ENTEROCOCCUS SPECIES Performed at [facility]    Report Status PENDING  Incomplete   Studies/Results: No results found. Medications: I have reviewed the patient's current medications. Scheduled Meds: . amoxicillin-clavulanate  1 tablet Oral Q12H  . aspirin EC  81 mg Oral Daily  . emtricitabine-tenofovir AF  1 tablet Oral Q breakfast  . enoxaparin (LOVENOX) injection  50 mg Subcutaneous Q24H  . feeding supplement (GLUCERNA SHAKE)  237 mL Oral BID BM  . insulin aspart  0-15 Units Subcutaneous TID WC  . insulin aspart  0-5 Units Subcutaneous QHS  . insulin aspart  15 Units Subcutaneous TID WC  . insulin glargine  45 Units Subcutaneous BID  . multivitamin with minerals  1 tablet Oral Daily  . nicotine  14 mg Transdermal Daily  . pravastatin  40 mg Oral q1800  . rilpivirine  25 mg Oral Q breakfast   Continuous Infusions:   PRN Meds:.acetaminophen, HYDROmorphone (DILAUDID) injection, lidocaine, nicotine polacrilex, oxyCODONE-acetaminophen, promethazine Assessment/Plan: Principal Problem:   Sepsis (HCC) Active Problems:   Diabetes mellitus type 2, uncontrolled, with complications (HCC)   Hyperlipidemia   TOBACCO USER   Essential hypertension   HIV (human immunodeficiency virus infection) (HCC)   Perianal abscess  Sepsis 2/2 perianal abscess S/p bedside I&D on 11/13/15. The abscess appears significantly reduced in size today and no purulent drainage noted. No erythema noted. Patient is afebrile and no systemic signs of infection. Blood cx showing no growth in 3 days. Wound cx growing GP Rods, GP clusters, GN rods.  Patient is stable and will likely be discharged today. -Appreciate surgery and wound care recs  -D/c IV antibiotics (vanc and zosyn). Patient has been transitioned to PO antibiotic Augmentin 875 mg BID x 7 days. -Shower daily and after each bowel movement - Apply dry dressing as it is too difficult to pack - F/u final wound cx  -Patient is to f/u with PCP within 2 weeks and f/u with surgery if needed   AKI Scr 1.76 on admission. Creatinine trended down to 1.3 today.  -patient is tolerating PO intake well and  creatinine is expected to improve   HIV  Labs from 4 months ago showing CD4 430 and HIV 1 RNA <20. Now CD4 280 (could be from acute illness) - f/up outpatient ID  HLD -Pravachol 40 mg daily   Uncontrolled DM2: A1c 11.1. CBGs in the 300s this morning, improved to 275 early this afternoon after making medication changes.   -Decrease Lantus to 45 u BID -Start Novolog 15 u TID -SSI -Hold home Glipizide and Metformin  -CBG improved to 275 early this afternoon.   DVT ppx: Lovenox   Diet: heart healthy   Code: Full  Dispo: Disposition is deferred at this time, awaiting improvement of current medical problems.  Anticipated discharge in approximately 0-1 day(s).   The patient does have a current PCP (Pcp Not In System) and does need an [facility] hospital follow-up appointment after discharge.  The patient does not have transportation limitations that hinder transportation to clinic appointments.  .Services Needed at time of discharge: Y = Yes, Blank = No PT:   OT:   RN:   Equipment:   Other:     LOS: 3 days   John Giovanni, MD 11/24/2015, 2:11 PM

## 2015-11-24 NOTE — Care Management Note (Addendum)
Case Management Note  Patient Details  Name: Harold Crosby MRN: [id number] Date of Birth: December 07, 1971  Subjective/Objective:                    Action/Plan:  MATCH letter given and explained . 14 day exception entered for Percocet . Patient has PCP Dr [address]ford [address]   [facility]  [address], [place], [place] [address] [phone]  Expected Discharge Date:  11/24/15               Expected Discharge Plan:  Home/Self Care  In-House Referral:     Discharge planning Services  CM Consult, MATCH Program, Medication Assistance  Post Acute Care Choice:    Choice offered to:     DME Arranged:    DME Agency:     HH Arranged:    HH Agency:     Status of Service:  Completed, signed off  Medicare Important Message Given:    Date Medicare IM Given:    Medicare IM give by:    Date Additional Medicare IM Given:    Additional Medicare Important Message give by:     If discussed at Long Length of Stay Meetings, dates discussed:    Additional Comments:  Kingsley PlanWile, Vandella Ord [name], RN 11/24/2015, 10:37 AM

## 2015-11-24 NOTE — Progress Notes (Signed)
Inpatient Diabetes Program Recommendations  AACE/ADA: New Consensus Statement on Inpatient Glycemic Control (2015)  Target Ranges:  Prepandial:   less than 140 mg/dL      Peak postprandial:   less than 180 mg/dL (1-2 hours)      Critically ill patients:  140 - 180 mg/dL   Review of Glycemic Control:  Results for Harold Crosby, Lennie L (MRN [id number]) as of 11/24/2015 08:02  Ref. Range 11/23/2015 07:51 11/23/2015 12:57 11/23/2015 16:59 11/23/2015 21:15 11/24/2015 07:28  Glucose-Capillary Latest Ref Range: 65-99 mg/dL 469262 (H) 629226 (H) 528307 (H) 314 (H) 320 (H)   Diabetes history: Type 2 diabetes  Inpatient Diabetes Program Recommendations:   Called and discussed with resident.  Please consider adding Novolog meal coverage 15 units tid with meals.  Thanks, Beryl MeagerJenny Eldra Word, RN, BC-ADM Inpatient Diabetes Coordinator Pager [phone] (8a-5p)

## 2015-11-25 LAB — WOUND CULTURE: GRAM STAIN: NONE SEEN

## 2015-11-26 LAB — CULTURE, BLOOD (ROUTINE X 2)
CULTURE: NO GROWTH
CULTURE: NO GROWTH

## 2016-03-08 ENCOUNTER — Ambulatory Visit: Payer: Self-pay

## 2016-03-08 ENCOUNTER — Other Ambulatory Visit (INDEPENDENT_AMBULATORY_CARE_PROVIDER_SITE_OTHER): Payer: Self-pay

## 2016-03-08 DIAGNOSIS — E785 Hyperlipidemia, unspecified: Secondary | ICD-10-CM

## 2016-03-08 DIAGNOSIS — Z79899 Other long term (current) drug therapy: Secondary | ICD-10-CM

## 2016-03-08 DIAGNOSIS — Z113 Encounter for screening for infections with a predominantly sexual mode of transmission: Secondary | ICD-10-CM

## 2016-03-08 DIAGNOSIS — B20 Human immunodeficiency virus [HIV] disease: Secondary | ICD-10-CM

## 2016-03-08 LAB — COMPLETE METABOLIC PANEL WITH GFR
ALK PHOS: 50 U/L (ref 40–115)
ALT: 38 U/L (ref 9–46)
AST: 17 U/L (ref 10–40)
Albumin: 3.8 g/dL (ref 3.6–5.1)
BUN: 18 mg/dL (ref 7–25)
CHLORIDE: 103 mmol/L (ref 98–110)
CO2: 28 mmol/L (ref 20–31)
CREATININE: 1.5 mg/dL — AB (ref 0.60–1.35)
Calcium: 9.9 mg/dL (ref 8.6–10.3)
GFR, EST NON AFRICAN AMERICAN: 55 mL/min — AB (ref >=60)
GFR, Est African American: 63 mL/min (ref >=60)
GLUCOSE: 108 mg/dL — AB (ref 65–99)
POTASSIUM: 4.3 mmol/L (ref 3.5–5.3)
SODIUM: 140 mmol/L (ref 135–146)
Total Bilirubin: 0.3 mg/dL (ref 0.2–1.2)
Total Protein: 6.6 g/dL (ref 6.1–8.1)

## 2016-03-08 LAB — CBC
HCT: 41.9 % (ref 39.0–52.0)
HEMOGLOBIN: 14.4 g/dL (ref 13.0–17.0)
MCH: 33.4 pg (ref 26.0–34.0)
MCHC: 34.4 g/dL (ref 30.0–36.0)
MCV: 97.2 fL (ref 78.0–100.0)
MPV: 9.4 fL (ref 8.6–12.4)
PLATELETS: 223 10*3/uL (ref 150–400)
RBC: 4.31 MIL/uL (ref 4.22–5.81)
RDW: 13.6 % (ref 11.5–15.5)
WBC: 5.3 10*3/uL (ref 4.0–10.5)

## 2016-03-08 LAB — LIPID PANEL
CHOL/HDL RATIO: 5.1 ratio — AB (ref <=5.0)
Cholesterol: 163 mg/dL (ref 125–200)
HDL: 32 mg/dL — ABNORMAL LOW (ref >=40)
LDL Cholesterol: 68 mg/dL (ref <130)
Triglycerides: 317 mg/dL — ABNORMAL HIGH (ref <150)
VLDL: 63 mg/dL — AB (ref <30)

## 2016-03-08 NOTE — Addendum Note (Signed)
Addended by: Delilah Mulgrew D on: 03/08/2016 10:54 AM   Modules accepted: Orders  

## 2016-03-09 LAB — HIV-1 RNA QUANT-NO REFLEX-BLD
HIV 1 RNA Quant: <20 copies/mL (ref <20)
HIV-1 RNA Quant, Log: <1.3 Log copies/mL (ref <1.30)

## 2016-03-09 LAB — URINE CYTOLOGY ANCILLARY ONLY
CHLAMYDIA, DNA PROBE: NEGATIVE
NEISSERIA GONORRHEA: NEGATIVE

## 2016-03-09 LAB — RPR

## 2016-03-09 LAB — T-HELPER CELL (CD4) - (RCID CLINIC ONLY)
CD4 T CELL HELPER: 32 % — AB (ref 33–55)
CD4 T Cell Abs: 340 /uL — ABNORMAL LOW (ref 400–2700)

## 2016-03-27 ENCOUNTER — Ambulatory Visit (INDEPENDENT_AMBULATORY_CARE_PROVIDER_SITE_OTHER): Payer: Self-pay | Admitting: Infectious Diseases

## 2016-03-27 ENCOUNTER — Encounter: Payer: Self-pay | Admitting: Infectious Diseases

## 2016-03-27 VITALS — BP 136/79 | HR 98 | Temp 97.4°F | Wt 243.0 lb

## 2016-03-27 DIAGNOSIS — E785 Hyperlipidemia, unspecified: Secondary | ICD-10-CM

## 2016-03-27 DIAGNOSIS — F17200 Nicotine dependence, unspecified, uncomplicated: Secondary | ICD-10-CM

## 2016-03-27 DIAGNOSIS — L97509 Non-pressure chronic ulcer of other part of unspecified foot with unspecified severity: Secondary | ICD-10-CM

## 2016-03-27 DIAGNOSIS — E08621 Diabetes mellitus due to underlying condition with foot ulcer: Secondary | ICD-10-CM

## 2016-03-27 DIAGNOSIS — B20 Human immunodeficiency virus [HIV] disease: Secondary | ICD-10-CM

## 2016-03-27 DIAGNOSIS — I10 Essential (primary) hypertension: Secondary | ICD-10-CM

## 2016-03-27 DIAGNOSIS — K610 Anal abscess: Secondary | ICD-10-CM

## 2016-03-27 NOTE — Progress Notes (Signed)
   Subjective:    Patient ID: Harold Crosby, male    DOB: 1971/11/17, 45 y.o.   MRN: [id number]  HPI 45 yo M with HIV+, adm to hospital November 2016 with peri-anal abscess/sepsis and uncontrolled DM2.  He had bedsire I & D on 11-29. His Cx grew enterococcus. He also had increased Cr (now 1.5, with CrCl ~54). He was d/c home on augmentin for 1 week.  This has healed well. Normal BM, no BRBPR. Has seen his PCP and had normal exam.  FSG have been better-avg ~ 160.  Taking odefsy without problems.   HIV 1 RNA QUANT (copies/mL)  Date Value  03/08/2016 <20  11/21/2015 <20  07/01/2015 <20   CD4 T CELL ABS (/uL)  Date Value  03/08/2016 340*  11/21/2015 280*  07/01/2015 430   Has been having arthritis, stiff getting out of bed in AM. Taking tylenol. Some improvement throughout the day.   Needs ophtho exam this year.   Review of Systems  Constitutional: Negative for appetite change and unexpected weight change.  Eyes: Negative for visual disturbance.  Respiratory: Negative for cough and shortness of breath.   Cardiovascular: Negative for chest pain.  Gastrointestinal: Negative for diarrhea, constipation, blood in stool and anal bleeding.  Genitourinary: Negative for difficulty urinating.  Musculoskeletal: Positive for arthralgias.  Neurological: Positive for numbness.  RUE      Objective:   Physical Exam  Constitutional: He appears well-developed and well-nourished.  HENT:  Mouth/Throat: No oropharyngeal exudate.  Eyes: EOM are normal. Pupils are equal, round, and reactive to light.  Cardiovascular: Normal rate, regular rhythm and normal heart sounds.   Pulmonary/Chest: Effort normal and breath sounds normal.  Abdominal: Soft. Bowel sounds are normal. There is no tenderness. There is no rebound.  Lymphadenopathy:    He has no cervical adenopathy.  Neurological:  Grossly normal light touch BLE  Skin:          Assessment & Plan:

## 2016-03-27 NOTE — Assessment & Plan Note (Signed)
He will f/u with his PCP in 2 weeks.  i asked that he bring this up with his PCP.  I asked him to monitor this closely and to let us know if worsening.

## 2016-03-27 NOTE — Assessment & Plan Note (Signed)
Has resolved.  He will f/u with PCP

## 2016-03-27 NOTE — Assessment & Plan Note (Signed)
Will continue statin, f/u with PCP

## 2016-03-27 NOTE — Assessment & Plan Note (Signed)
Doing well  Will continue ACE-I  Need to watch Cr F/u with PCP, states he has not seen renal

## 2016-03-27 NOTE — Assessment & Plan Note (Signed)
Encouraged to cut back to 1/2 ppd by next visit.  He is currently at 1 ppd (down from 2ppd)

## 2016-03-27 NOTE — Assessment & Plan Note (Signed)
Doing well.  Will continue odefsy.  Offered/refused condoms.  Will see him back in 6 months.

## 2016-07-13 ENCOUNTER — Ambulatory Visit (INDEPENDENT_AMBULATORY_CARE_PROVIDER_SITE_OTHER): Payer: Self-pay | Admitting: Family Medicine

## 2016-07-13 ENCOUNTER — Encounter: Payer: Self-pay | Admitting: Family Medicine

## 2016-07-13 VITALS — BP 96/54 | HR 102 | Temp 98.1°F | Resp 16 | Ht 73.0 in | Wt 245.0 lb

## 2016-07-13 DIAGNOSIS — M7989 Other specified soft tissue disorders: Secondary | ICD-10-CM

## 2016-07-13 DIAGNOSIS — Z794 Long term (current) use of insulin: Secondary | ICD-10-CM

## 2016-07-13 DIAGNOSIS — E083399 Diabetes mellitus due to underlying condition with moderate nonproliferative diabetic retinopathy without macular edema, unspecified eye: Secondary | ICD-10-CM

## 2016-07-13 DIAGNOSIS — IMO0002 Reserved for concepts with insufficient information to code with codable children: Secondary | ICD-10-CM

## 2016-07-13 DIAGNOSIS — E119 Type 2 diabetes mellitus without complications: Secondary | ICD-10-CM

## 2016-07-13 DIAGNOSIS — B20 Human immunodeficiency virus [HIV] disease: Secondary | ICD-10-CM

## 2016-07-13 DIAGNOSIS — I10 Essential (primary) hypertension: Secondary | ICD-10-CM

## 2016-07-13 DIAGNOSIS — M79605 Pain in left leg: Secondary | ICD-10-CM

## 2016-07-13 DIAGNOSIS — R600 Localized edema: Secondary | ICD-10-CM | POA: Insufficient documentation

## 2016-07-13 DIAGNOSIS — E785 Hyperlipidemia, unspecified: Secondary | ICD-10-CM

## 2016-07-13 DIAGNOSIS — E118 Type 2 diabetes mellitus with unspecified complications: Secondary | ICD-10-CM

## 2016-07-13 DIAGNOSIS — M79604 Pain in right leg: Secondary | ICD-10-CM

## 2016-07-13 DIAGNOSIS — E1165 Type 2 diabetes mellitus with hyperglycemia: Secondary | ICD-10-CM

## 2016-07-13 LAB — CBC WITH DIFFERENTIAL/PLATELET
BASOS ABS: 0 {cells}/uL (ref 0–200)
Basophils Relative: 0 %
EOS ABS: 138 {cells}/uL (ref 15–500)
Eosinophils Relative: 2 %
HCT: 42.1 % (ref 38.5–50.0)
Hemoglobin: 14.4 g/dL (ref 13.2–17.1)
LYMPHS PCT: 25 %
Lymphs Abs: 1725 cells/uL (ref 850–3900)
MCH: 33.9 pg — AB (ref 27.0–33.0)
MCHC: 34.2 g/dL (ref 32.0–36.0)
MCV: 99.1 fL (ref 80.0–100.0)
MONO ABS: 690 {cells}/uL (ref 200–950)
MONOS PCT: 10 %
MPV: 9.9 fL (ref 7.5–12.5)
Neutro Abs: 4347 cells/uL (ref 1500–7800)
Neutrophils Relative %: 63 %
PLATELETS: 242 10*3/uL (ref 140–400)
RBC: 4.25 MIL/uL (ref 4.20–5.80)
RDW: 13.9 % (ref 11.0–15.0)
WBC: 6.9 10*3/uL (ref 3.8–10.8)

## 2016-07-13 LAB — COMPLETE METABOLIC PANEL WITH GFR
ALT: 54 U/L — AB (ref 9–46)
AST: 26 U/L (ref 10–40)
Albumin: 4.2 g/dL (ref 3.6–5.1)
Alkaline Phosphatase: 57 U/L (ref 40–115)
BUN: 40 mg/dL — AB (ref 7–25)
CHLORIDE: 102 mmol/L (ref 98–110)
CO2: 24 mmol/L (ref 20–31)
CREATININE: 1.65 mg/dL — AB (ref 0.60–1.35)
Calcium: 9.6 mg/dL (ref 8.6–10.3)
GFR, Est African American: 56 mL/min — ABNORMAL LOW (ref >=60)
GFR, Est Non African American: 49 mL/min — ABNORMAL LOW (ref >=60)
GLUCOSE: 151 mg/dL — AB (ref 65–99)
Potassium: 4.9 mmol/L (ref 3.5–5.3)
Sodium: 138 mmol/L (ref 135–146)
Total Bilirubin: 0.3 mg/dL (ref 0.2–1.2)
Total Protein: 7 g/dL (ref 6.1–8.1)

## 2016-07-13 LAB — LIPID PANEL
CHOLESTEROL: 181 mg/dL (ref 125–200)
HDL: 33 mg/dL — ABNORMAL LOW (ref >=40)
Total CHOL/HDL Ratio: 5.5 Ratio — ABNORMAL HIGH (ref <=5.0)
Triglycerides: 421 mg/dL — ABNORMAL HIGH (ref <150)

## 2016-07-13 LAB — POCT GLYCOSYLATED HEMOGLOBIN (HGB A1C): Hemoglobin A1C: 8.7

## 2016-07-13 MED ORDER — LISINOPRIL 10 MG PO TABS: 10.0000 mg | ORAL_TABLET | Freq: Every day | ORAL | Status: DC

## 2016-07-13 MED ORDER — INSULIN ASPART 100 UNIT/ML ~~LOC~~ SOLN: 30.0000 [IU] | Freq: Three times a day (TID) | SUBCUTANEOUS | Status: DC

## 2016-07-13 NOTE — Addendum Note (Signed)
Addended by: Alease FrameARTER, SONYA S on: 07/13/2016 04:40 PM   Modules accepted: Orders

## 2016-07-13 NOTE — Progress Notes (Signed)
Name: Harold Crosby   MRN: [id number]    DOB: [birth year]   Date:07/13/2016       Progress Note  Subjective  Chief Complaint  Chief Complaint  Patient presents with  . Establish Care    HPI Here as new patient to establish care.  He has DM, poorly controlled,  HBP, elevated lipoids, HIV (x 10 years).   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  He c/o severe bilateral leg weakness and muscle pain.  This has been  going on for 2-3 months.   This has impeded his walking and climbing stairs.   His ID doctor Education officer, community) feels that he is doing well with his HIV at this time.  BSs at home running 120-225 (AM fasting). No problem-specific assessment & plan notes found for this encounter.   Past Medical History  Diagnosis Date  . HIV (human immunodeficiency virus infection) (HCC)     Followed by Dr. [name] in ID clinic.   . Type II diabetes mellitus (HCC)   . Hyperlipidemia   . Tobacco abuse   . Hypertension   . History of thrush   . Insomnia     History reviewed. No pertinent past surgical history.  Family History  Problem Relation Age of Onset  . Coronary artery disease Other   . Diabetes Other   . Kidney failure Mother     Social History   Social History  . Marital Status: Single    Spouse Name: N/A  . Number of Children: N/A  . Years of Education: N/A   Occupational History  . Not on file.   Social History Main Topics  . Smoking status: Current Every Day Smoker -- 1.00 packs/day for 20 years    Types: Cigarettes  . Smokeless tobacco: Never Used     Comment: trying to quit  . Alcohol Use: No  . Drug Use: No  . Sexual Activity:    Partners: Female     Comment: pt. declined condoms   Other Topics Concern  . Not on file   Social History Narrative   Same girlfriend x 7 years. No previous TFX. No previous male/male intercourse.  Un clear how many heterosexual partners he has had >10, <100.      [place] till 03[place]25/11 Juanell Fairly benefits  approved: patient eligible for 100% discount for outpatient lasbs and office visits. Patient available for no discount for other services. Kandice Robinsons 11/11.     Current outpatient prescriptions:  .  aspirin 81 MG tablet, Take 81 mg by mouth daily.  , Disp: , Rfl:  .  emtricitabine-rilpivir-tenofovir AF (ODEFSEY) 200-25-25 MG TABS per tablet, Take 1 tablet by mouth daily with breakfast., Disp: 90 tablet, Rfl: 3 .  finasteride (PROSCAR) 5 MG tablet, Take 5 mg by mouth daily., Disp: , Rfl:  .  gabapentin (NEURONTIN) 300 MG capsule, Take 300 mg by mouth 3 (three) times daily., Disp: , Rfl:  .  glipiZIDE (GLUCOTROL) 10 MG tablet, Take 1 tablet (10 mg total) by mouth 2 (two) times daily before a meal., Disp: 180 tablet, Rfl: 0 .  insulin aspart (NOVOLOG) 100 UNIT/ML injection, Inject 30 Units into the skin 3 (three) times daily with meals., Disp: 100 mL, Rfl: 11 .  insulin glargine (LANTUS) 100 UNIT/ML injection, Inject 0.45 mLs (45 Units total) into the skin 2 (two) times daily. (Patient taking differently: Inject 50 Units into the skin 2 (two) times daily. ), Disp: 15 mL, Rfl: 11 .  metFORMIN (GLUCOPHAGE) 1000 MG tablet, Take 1 tablet (1,000 mg total) by mouth 2 (two) times daily with a meal., Disp: 60 tablet, Rfl: 2 .  lisinopril (PRINIVIL,ZESTRIL) 10 MG tablet, Take 1 tablet (10 mg total) by mouth daily., Disp: 90 tablet, Rfl: 3 No current facility-administered medications for this visit.  Facility-Administered Medications Ordered in Other Visits:  .  cyanocobalamin ((VITAMIN B-12)) injection 1,000 mcg, 1,000 mcg, Intramuscular, Once, Ginnie Smart, MD  Allergies  Allergen Reactions  . Sulfonamide Derivatives Anaphylaxis    REACTION: skin welps, tongue swells, throat closes up     Review of Systems  Constitutional: Positive for malaise/fatigue. Negative for fever, chills and weight loss.  HENT: Negative for hearing loss.   Eyes: Negative for blurred vision and double vision.   Respiratory: Positive for cough and sputum production. Negative for shortness of breath and wheezing.   Cardiovascular: Positive for leg swelling. Negative for chest pain, palpitations and claudication.  Skin: Negative for rash.  Neurological: Positive for weakness. Negative for headaches.      Objective  Filed Vitals:   07/13/16 1416  BP: 96/54  Pulse: 102  Temp: 98.1 F (36.7 C)  TempSrc: Oral  Resp: 16  Height: 6\' 1"  (1.854 m)  Weight: 245 lb (111.131 kg)    Physical Exam  Constitutional: He is oriented to person, place, and time and well-developed, well-nourished, and in no distress. No distress.  HENT:  Head: Normocephalic and atraumatic.  Eyes: Conjunctivae and EOM are normal. Pupils are equal, round, and reactive to light. No scleral icterus.  Neck: Normal range of motion. Neck supple. Carotid bruit is not present. No thyromegaly present.  Cardiovascular: Normal rate, regular rhythm and normal heart sounds.  Exam reveals no gallop and no friction rub.   No murmur heard. Pulmonary/Chest: Effort normal and breath sounds normal. No respiratory distress. He has no wheezes. He has no rales.  Abdominal: Soft. Bowel sounds are normal. He exhibits no distension and no mass. There is no tenderness.  Musculoskeletal: He exhibits edema (bilateral  edema of lower legs with painful p;alpation).  Lymphadenopathy:    He has no cervical adenopathy.  Neurological: He is alert and oriented to person, place, and time.  Some decreased sensation of both lower legs and fewet.  Vitals reviewed.      Recent Results (from the past 2160 hour(s))  POCT HgB A1C     Status: Abnormal   Collection Time: 07/13/16  2:49 PM  Result Value Ref Range   Hemoglobin A1C 8.7%      Assessment & Plan  Problem List Items Addressed This Visit      Cardiovascular and Mediastinum   Essential hypertension   Relevant Medications   lisinopril (PRINIVIL,ZESTRIL) 10 MG tablet     Endocrine    Diabetes mellitus type 2, uncontrolled, with complications (HCC)   Relevant Medications   lisinopril (PRINIVIL,ZESTRIL) 10 MG tablet   insulin aspart (NOVOLOG) 100 UNIT/ML injection   Moderate nonproliferative diabetic retinopathy (HCC)   Relevant Medications   lisinopril (PRINIVIL,ZESTRIL) 10 MG tablet   insulin aspart (NOVOLOG) 100 UNIT/ML injection     Other   Hyperlipidemia   Relevant Medications   lisinopril (PRINIVIL,ZESTRIL) 10 MG tablet   Other Relevant Orders   Lipid Profile   Human immunodeficiency virus disease (HCC)   Leg pain, bilateral   Relevant Orders   CBC with Differential   VAS [place] LOWER EXTREMITY VENOUS (DVT)   Leg swelling   Relevant Orders   VAS  US LOWER EXTREMITY VENOUS (DVT)    Other Visit Diagnoses    Type 2 diabetes mellitus without complication, unspecified long term insulin use status (HCC)    -  Primary    Relevant Medications    lisinopril (PRINIVIL,ZESTRIL) 10 MG tablet    insulin aspart (NOVOLOG) 100 UNIT/ML injection    Other Relevant Orders    POCT HgB A1C (Completed)    COMPLETE METABOLIC PANEL WITH GFR       Meds ordered this encounter  Medications  . gabapentin (NEURONTIN) 300 MG capsule    Sig: Take 300 mg by mouth 3 (three) times daily.  . finasteride (PROSCAR) 5 MG tablet    Sig: Take 5 mg by mouth daily.  Marland Kitchen. lisinopril (PRINIVIL,ZESTRIL) 10 MG tablet    Sig: Take 1 tablet (10 mg total) by mouth daily.    Dispense:  90 tablet    Refill:  3  . insulin aspart (NOVOLOG) 100 UNIT/ML injection    Sig: Inject 30 Units into the skin 3 (three) times daily with meals.    Dispense:  100 mL    Refill:  11   1. Type 2 diabetes mellitus without complication, unspecified long term insulin use status (HCC)  - POCT HgB A1C-8.7 - COMPLETE METABOLIC PANEL WITH GFR  2. Uncontrolled type 2 diabetes mellitus with complication, with long-term current use of insulin (HCC)  - insulin aspart (NOVOLOG) 100 UNIT/ML injection; Inject 30 Units into  the skin 3 (three) times daily with meals.  Dispense: 100 mL; Refill: 11  3. Essential hypertension  - lisinopril (PRINIVIL,ZESTRIL) 10 MG tablet; Take 1 tablet (10 mg total) by mouth daily.  Dispense: 90 tablet; Refill: 3  4. Moderate nonproliferative diabetic retinopathy without macular edema associated with diabetes mellitus due to underlying condition (HCC)   5. Human immunodeficiency virus disease (HCC) Cont meds  6. Hyperlipidemia Stop Pravahol - Lipid Profile  7. Leg pain, bilateral  - CBC with Differential - VAS [place] LOWER EXTREMITY VENOUS (DVT); Future  8. Leg swelling  - VAS US LOWER EXTREMITY VENOUS (DVT); Future

## 2016-07-13 NOTE — Addendum Note (Signed)
Addended by: Alease FrameARTER, SONYA S on: 07/13/2016 04:15 PM   Modules accepted: Orders

## 2016-07-14 ENCOUNTER — Ambulatory Visit
Admission: RE | Admit: 2016-07-14 | Discharge: 2016-07-14 | Disposition: A | Discharge status: Home / Self Care | Payer: Self-pay | Source: Ambulatory Visit | Attending: Family Medicine | Admitting: Family Medicine

## 2016-07-14 ENCOUNTER — Other Ambulatory Visit: Payer: Self-pay | Admitting: Family Medicine

## 2016-07-14 DIAGNOSIS — M79662 Pain in left lower leg: Secondary | ICD-10-CM

## 2016-07-14 DIAGNOSIS — M7989 Other specified soft tissue disorders: Secondary | ICD-10-CM | POA: Insufficient documentation

## 2016-07-17 ENCOUNTER — Other Ambulatory Visit: Payer: Self-pay | Admitting: *Deleted

## 2016-07-17 MED ORDER — ATORVASTATIN CALCIUM 20 MG PO TABS: 20.0000 mg | ORAL_TABLET | Freq: Every day | ORAL | 6 refills | Status: DC

## 2016-08-03 ENCOUNTER — Telehealth: Payer: Self-pay | Admitting: Family Medicine

## 2016-08-03 MED ORDER — GABAPENTIN 400 MG PO CAPS: 400.0000 mg | ORAL_CAPSULE | Freq: Three times a day (TID) | ORAL | 3 refills | Status: DC

## 2016-08-03 NOTE — Telephone Encounter (Signed)
He may take Gabapentin 400 mg., 1 three times a day until he returns for f/u appt.  Send in this med art this dose to his pharmacy please.  (#90 /3 refills).-jh

## 2016-08-03 NOTE — Telephone Encounter (Signed)
Pt. Called request that his gabapentin be increased. Pt call back # [phone]

## 2016-08-20 ENCOUNTER — Other Ambulatory Visit: Payer: Self-pay | Admitting: Infectious Diseases

## 2016-08-20 DIAGNOSIS — B20 Human immunodeficiency virus [HIV] disease: Secondary | ICD-10-CM

## 2016-08-31 ENCOUNTER — Encounter: Payer: Self-pay | Admitting: Family Medicine

## 2016-08-31 ENCOUNTER — Ambulatory Visit (INDEPENDENT_AMBULATORY_CARE_PROVIDER_SITE_OTHER): Payer: Self-pay | Admitting: Family Medicine

## 2016-08-31 VITALS — BP 110/60 | HR 87 | Temp 98.4°F | Resp 16 | Ht 71.0 in | Wt 247.0 lb

## 2016-08-31 DIAGNOSIS — B20 Human immunodeficiency virus [HIV] disease: Secondary | ICD-10-CM

## 2016-08-31 DIAGNOSIS — M79605 Pain in left leg: Secondary | ICD-10-CM

## 2016-08-31 DIAGNOSIS — M7989 Other specified soft tissue disorders: Secondary | ICD-10-CM

## 2016-08-31 DIAGNOSIS — E785 Hyperlipidemia, unspecified: Secondary | ICD-10-CM

## 2016-08-31 DIAGNOSIS — N183 Chronic kidney disease, stage 3 unspecified: Secondary | ICD-10-CM

## 2016-08-31 DIAGNOSIS — IMO0002 Reserved for concepts with insufficient information to code with codable children: Secondary | ICD-10-CM

## 2016-08-31 DIAGNOSIS — E118 Type 2 diabetes mellitus with unspecified complications: Secondary | ICD-10-CM

## 2016-08-31 DIAGNOSIS — M79604 Pain in right leg: Secondary | ICD-10-CM

## 2016-08-31 DIAGNOSIS — E1165 Type 2 diabetes mellitus with hyperglycemia: Secondary | ICD-10-CM

## 2016-08-31 DIAGNOSIS — F17200 Nicotine dependence, unspecified, uncomplicated: Secondary | ICD-10-CM

## 2016-08-31 DIAGNOSIS — I10 Essential (primary) hypertension: Secondary | ICD-10-CM

## 2016-08-31 DIAGNOSIS — Z794 Long term (current) use of insulin: Secondary | ICD-10-CM

## 2016-08-31 MED ORDER — GABAPENTIN 300 MG PO CAPS: 600.0000 mg | ORAL_CAPSULE | Freq: Three times a day (TID) | ORAL | 6 refills | Status: DC

## 2016-08-31 NOTE — Progress Notes (Signed)
Name: Harold Crosby   MRN: [id number]    DOB: Jul 25, 1971   Date:08/31/2016       Progress Note  Subjective  Chief Complaint  Chief Complaint  Patient presents with  . Diabetes    Diabetes  Pertinent negatives for hypoglycemia include no dizziness, headaches or tremors. Associated symptoms include weakness (in legs). Pertinent negatives for diabetes include no blurred vision, no chest pain and no weight loss.   Here for f/u of DM and HBP and pedal edema.  He has adjusted his own meds.  He did not take Lantus.  Taking Novolin N, 50 units bid and Novolin R, 30 units before each meal.  He also restarted his Lisinopril/HCTZ on his own because of his feet swelling.  BPs at home in 115-120 sys range.  BSs in 180-200 fasting, and 200 at night before going to bed.  He does not seem to want to follow any directions from me on his care.  He has not had any  Hypoglycemic episodes. Reports that his diabetic peripheral neuropathy is getting worse again, even with increased dose of Gabapentin. He reports that his HIV is doing well per Dr. Johnnye Sima. No problem-specific Assessment & Plan notes found for this encounter.   Past Medical History:  Diagnosis Date  . History of thrush   . HIV (human immunodeficiency virus infection) ([place])    Followed by Dr. Johnnye Sima in [place] [facility].   Marland Kitchen Hyperlipidemia   . Hypertension   . Insomnia   . Tobacco abuse   . Type II diabetes mellitus ([place])     No past surgical history on file.  Family History  Problem Relation Age of Onset  . Coronary artery disease Other   . Diabetes Other   . Kidney failure Mother     Social History   Social History  . Marital status: Single    Spouse name: N/A  . Number of children: N/A  . Years of education: N/A   Occupational History  . Not on file.   Social History Main Topics  . Smoking status: Current Every Day Smoker    Packs/day: 1.00    Years: 20.00    Types: Cigarettes  . Smokeless tobacco: Never Used     Comment:  trying to quit  . Alcohol use No  . Drug use: No  . Sexual activity: Yes    Partners: Female     Comment: pt. declined condoms   Other Topics Concern  . Not on file   Social History Narrative   Same girlfriend x 7 years. No previous TFX. No previous male/male intercourse.  Un clear how many heterosexual partners he has had >10, <100.      [employer] approved till 03/25/11 [place] benefits approved: patient eligible for 100% discount for outpatient lasbs and office visits. Patient available for no discount for other services. Irish Elders 11/11.     Current Outpatient Prescriptions:  .  aspirin 81 MG tablet, Take 81 mg by mouth daily.  , Disp: , Rfl:  .  atorvastatin (LIPITOR) 20 MG tablet, Take 1 tablet (20 mg total) by mouth daily., Disp: 30 tablet, Rfl: 6 .  emtricitabine-rilpivir-tenofovir AF (ODEFSEY) 200-25-25 MG TABS per tablet, Take 1 tablet by mouth daily with breakfast., Disp: 90 tablet, Rfl: 3 .  finasteride (PROSCAR) 5 MG tablet, Take 5 mg by mouth daily., Disp: , Rfl:  .  gabapentin (NEURONTIN) 400 MG capsule, Take 1 capsule (400 mg total) by mouth 3 (three) times  daily., Disp: 90 capsule, Rfl: 3 .  glipiZIDE (GLUCOTROL) 10 MG tablet, Take 1 tablet (10 mg total) by mouth 2 (two) times daily before a meal., Disp: 180 tablet, Rfl: 0 .  insulin aspart (NOVOLOG) 100 UNIT/ML injection, Inject 30 Units into the skin 3 (three) times daily with meals., Disp: 100 mL, Rfl: 11 .  insulin glargine (LANTUS) 100 UNIT/ML injection, Inject 0.45 mLs (45 Units total) into the skin 2 (two) times daily. (Patient taking differently: Inject 50 Units into the skin 2 (two) times daily. ), Disp: 15 mL, Rfl: 11 .  lisinopril-hydrochlorothiazide (PRINZIDE,ZESTORETIC) 20-25 MG tablet, TAKE 1 TABLET BY MOUTH DAILY, Disp: 60 tablet, Rfl: 4 .  metFORMIN (GLUCOPHAGE) 1000 MG tablet, Take 1 tablet (1,000 mg total) by mouth 2 (two) times daily with a meal., Disp: 60 tablet, Rfl: 2 .  ODEFSEY 200-25-25 MG TABS  tablet, TAKE 1 TABLET BY MOUTH DAILY WITH BREAKFAST, Disp: 90 tablet, Rfl: 1 .  lisinopril (PRINIVIL,ZESTRIL) 10 MG tablet, Take 1 tablet (10 mg total) by mouth daily. (Patient not taking: Reported on 08/31/2016), Disp: 90 tablet, Rfl: 3 No current facility-administered medications for this visit.   Facility-Administered Medications Ordered in Other Visits:  .  cyanocobalamin ((VITAMIN B-12)) injection 1,000 mcg, 1,000 mcg, Intramuscular, Once, Campbell Riches, MD  Allergies  Allergen Reactions  . Sulfonamide Derivatives Anaphylaxis    REACTION: skin welps, tongue swells, throat closes up     Review of Systems  Constitutional: Positive for malaise/fatigue. Negative for chills, fever and weight loss.  HENT: Negative for hearing loss.   Eyes: Negative for blurred vision and double vision.  Respiratory: Negative for cough, shortness of breath and wheezing.   Cardiovascular: Positive for leg swelling. Negative for chest pain and palpitations.  Gastrointestinal: Negative for blood in stool, heartburn and vomiting.  Genitourinary: Negative for dysuria, frequency and urgency.  Musculoskeletal: Positive for back pain (long standing).  Skin: Negative for rash.  Neurological: Positive for tingling and weakness (in legs). Negative for dizziness, tremors and headaches.      Objective  Vitals:   08/31/16 0841  BP: 112/73  Pulse: 87  Resp: 16  Temp: 98.4 F (36.9 C)  TempSrc: Oral  Weight: 247 lb (112 kg)  Height: '5\' 11"'$  (1.803 m)    Physical Exam  Constitutional: He is oriented to person, place, and time and well-developed, well-nourished, and in no distress. No distress.  HENT:  Head: Normocephalic and atraumatic.  Eyes: Conjunctivae and EOM are normal. Pupils are equal, round, and reactive to light. No scleral icterus.  Neck: Normal range of motion. Neck supple. No thyromegaly present.  Cardiovascular: Normal rate, regular rhythm and normal heart sounds.  Exam reveals no gallop  and no friction rub.   No murmur heard. Pulmonary/Chest: Effort normal and breath sounds normal. No respiratory distress. He has no wheezes. He exhibits no tenderness.  Abdominal: Soft. Bowel sounds are normal. He exhibits no distension and no mass. There is no tenderness.  Musculoskeletal: He exhibits edema (trace to 1+ pedal edema bilaterally.).  Lymphadenopathy:    He has no cervical adenopathy.  Neurological: He is alert and oriented to person, place, and time.  Vitals reviewed.      Recent Results (from the past 2160 hour(s))  POCT HgB A1C     Status: Abnormal   Collection Time: 07/13/16  2:49 PM  Result Value Ref Range   Hemoglobin A1C 8.7%   Lipid Profile     Status: Abnormal   Collection  Time: 07/13/16  4:27 PM  Result Value Ref Range   Cholesterol 181 125 - 200 mg/dL   Triglycerides 421 (H) <150 mg/dL   HDL 33 (L) >=40 mg/dL   Total CHOL/HDL Ratio 5.5 (H) <=5.0 Ratio   VLDL NOT CALC <30 mg/dL    Comment:   Not calculated due to Triglyceride >400. Suggest ordering Direct LDL (Unit Code: [phone]).    LDL Cholesterol NOT CALC <130 mg/dL    Comment:   Not calculated due to Triglyceride >400. Suggest ordering Direct LDL (Unit Code: [phone]).   Total Cholesterol/HDL Ratio:CHD Risk                        Coronary Heart Disease Risk Table                                        Men       Women          1/2 Average Risk              3.4        3.3              Average Risk              5.0        4.4           2X Average Risk              9.6        7.1           3X Average Risk             23.4       11.0 Use the calculated Patient Ratio above and the CHD Risk table  to determine the patient's CHD Risk.   COMPLETE METABOLIC PANEL WITH GFR     Status: Abnormal   Collection Time: 07/13/16  4:27 PM  Result Value Ref Range   Sodium 138 135 - 146 mmol/L   Potassium 4.9 3.5 - 5.3 mmol/L   Chloride 102 98 - 110 mmol/L   CO2 24 20 - 31 mmol/L   Glucose, Bld 151 (H) 65 - 99  mg/dL   BUN 40 (H) 7 - 25 mg/dL   Creat 1.65 (H) 0.60 - 1.35 mg/dL   Total Bilirubin 0.3 0.2 - 1.2 mg/dL   Alkaline Phosphatase 57 40 - 115 U/L   AST 26 10 - 40 U/L   ALT 54 (H) 9 - 46 U/L   Total Protein 7.0 6.1 - 8.1 g/dL   Albumin 4.2 3.6 - 5.1 g/dL   Calcium 9.6 8.6 - 10.3 mg/dL   GFR, Est African American 56 (L) >=60 mL/min   GFR, Est Non African American 49 (L) >=60 mL/min  CBC with Differential     Status: Abnormal   Collection Time: 07/13/16  4:27 PM  Result Value Ref Range   WBC 6.9 3.8 - 10.8 K/uL   RBC 4.25 4.20 - 5.80 MIL/uL   Hemoglobin 14.4 13.2 - 17.1 g/dL   HCT 42.1 38.5 - 50.0 %   MCV 99.1 80.0 - 100.0 fL   MCH 33.9 (H) 27.0 - 33.0 pg   MCHC 34.2 32.0 - 36.0 g/dL   RDW 13.9 11.0 - 15.0 %   Platelets 242 140 - 400 K/uL   MPV  9.9 7.5 - 12.5 fL   Neutro Abs 4,347 1,500 - 7,800 cells/uL   Lymphs Abs 1,725 850 - 3,900 cells/uL   Monocytes Absolute 690 200 - 950 cells/uL   Eosinophils Absolute 138 15 - 500 cells/uL   Basophils Absolute 0 0 - 200 cells/uL   Neutrophils Relative % 63 %   Lymphocytes Relative 25 %   Monocytes Relative 10 %   Eosinophils Relative 2 %   Basophils Relative 0 %   Smear Review Criteria for review not met     Comment: ** Please note change in unit of measure and reference range(s). **     Assessment & Plan  Problem List Items Addressed This Visit    None    Visit Diagnoses   None.     No orders of the defined types were placed in this encounter.

## 2016-09-26 ENCOUNTER — Other Ambulatory Visit: Payer: Self-pay | Admitting: Family Medicine

## 2016-09-26 ENCOUNTER — Other Ambulatory Visit: Payer: Self-pay

## 2016-09-26 LAB — BASIC METABOLIC PANEL WITH GFR
BUN: 29 mg/dL — AB (ref 7–25)
CALCIUM: 9.8 mg/dL (ref 8.6–10.3)
CHLORIDE: 99 mmol/L (ref 98–110)
CO2: 27 mmol/L (ref 20–31)
CREATININE: 1.73 mg/dL — AB (ref 0.60–1.35)
GFR, Est African American: 53 mL/min — ABNORMAL LOW (ref >=60)
GFR, Est Non African American: 46 mL/min — ABNORMAL LOW (ref >=60)
Glucose, Bld: 269 mg/dL — ABNORMAL HIGH (ref 65–99)
Potassium: 4.7 mmol/L (ref 3.5–5.3)
SODIUM: 138 mmol/L (ref 135–146)

## 2016-09-28 ENCOUNTER — Ambulatory Visit: Payer: Self-pay | Admitting: Family Medicine

## 2016-09-28 NOTE — Progress Notes (Signed)
Patient notified and r/s appt for tomorrow.

## 2016-09-28 NOTE — Progress Notes (Signed)
Patient notified and appt made for tomorrow.

## 2016-09-29 ENCOUNTER — Ambulatory Visit (INDEPENDENT_AMBULATORY_CARE_PROVIDER_SITE_OTHER): Payer: Self-pay | Admitting: Family Medicine

## 2016-09-29 ENCOUNTER — Encounter: Payer: Self-pay | Admitting: Family Medicine

## 2016-09-29 VITALS — BP 120/79 | HR 94 | Temp 98.2°F | Resp 16 | Ht 71.0 in | Wt 246.0 lb

## 2016-09-29 DIAGNOSIS — R600 Localized edema: Secondary | ICD-10-CM

## 2016-09-29 DIAGNOSIS — Z794 Long term (current) use of insulin: Secondary | ICD-10-CM

## 2016-09-29 DIAGNOSIS — E1122 Type 2 diabetes mellitus with diabetic chronic kidney disease: Secondary | ICD-10-CM

## 2016-09-29 DIAGNOSIS — IMO0002 Reserved for concepts with insufficient information to code with codable children: Secondary | ICD-10-CM

## 2016-09-29 DIAGNOSIS — E1165 Type 2 diabetes mellitus with hyperglycemia: Secondary | ICD-10-CM

## 2016-09-29 DIAGNOSIS — N183 Chronic kidney disease, stage 3 unspecified: Secondary | ICD-10-CM | POA: Insufficient documentation

## 2016-09-29 DIAGNOSIS — E118 Type 2 diabetes mellitus with unspecified complications: Secondary | ICD-10-CM

## 2016-09-29 DIAGNOSIS — E782 Mixed hyperlipidemia: Secondary | ICD-10-CM

## 2016-09-29 DIAGNOSIS — I10 Essential (primary) hypertension: Secondary | ICD-10-CM

## 2016-09-29 NOTE — Assessment & Plan Note (Signed)
Concern with gradual trend of raising Cr. Recently 1.73, recent prior steady trend from 1.4 to 1.6, prior review with baseline previously 1.0-1.3 back in 2015, then significant rise in 2016 as high as 1.7 to 1.8, then improved again. Suspect uncontrolled DM, some component of chronic HTN affecting, also concern with chronic NSAID use of Meloxicam, and regular Lasix use for edema.  Plan: 1. Continue ACE/HCTZ at this time given gradual raise and not considered AKI 2. Improve hydration, limit diet mtn dew, inc water 3. Taper off Lasix, may use PRN only, future consider only half tab 4. Stop Meloxicam, and other NSAID. Advised on safe Tylenol dosing for back OA 5. Follow-up 1 month, re-check BMET follow Cr trend. If significant still worsening, also depending on DM/HTN control, consider initial Renal Consultation.

## 2016-09-29 NOTE — Assessment & Plan Note (Signed)
Poor control, last A1c 8.7% (07/13/16), recent chemistry with glucose 269. Recent poor control home CBGs, concern with some changing home meds with switching from Lantus to Novolin, still taking Glipizide 10 BID (concern not effective) Complicated with peripheral neuropathy, retinopathy, and CKD   Plan:  1. Without CBG logs today, difficult to specifically adjust insulin. However discussed if fasting AM CBG >150 persistently, go up on Lantus 1u BID every other day until reach 50 BID, can go up to 55 BID max. May similarly titrate Novolin if decides to switch back. 2. Limited options given no insurance coverage, discussed some alternative DM medications but unable to proceed for patient at this time. Will consider coupon discount for Lantus or Tresiba for long acting insulin, also will provide patient with Med Management [facility] contact to establish for med assistance. 3. Improve DM diet, reduce portions, limit carbs, stop sodas, given limited time for exercise, but will need start regular exercise. Already been to dietitian 4. Follow-up 1 month for A1c, chemistry, anticipate will need to resume statin in future

## 2016-09-29 NOTE — Patient Instructions (Signed)
Thank you for coming in to clinic today.  1. Check blood sugar 1-2 times a day, once fasting in morning, and second time 2 hour after lunch OR dinner OR bedtime - Write down numbers on the hand out bring to next visit - Keep gradually going up on your Lantus by 1 unit every other day if morning fasting blood sugar is consistently above 150, goal to reach 50 twice a day. Caution if you develop low sugar < 100 and symptoms. Let us know - You may go up on the Novolin in future when you run out of lantus as well as discussed - Keep improving diet - Reduce or stop Diet Mtn Dew, improve water intake, this was likely dehydrating you as well  2. Stop Meloxicam, this is harming your kidneys most likely with the diabetes - DO NOT TAKE any ibuprofen, aleve, motrin while you are taking this medicine mend to start taking Tylenol Extra Strength 500mg  tabs - take 1 to 2 tabs (max 1000mg  per dose) every 6 hours for pain (take regularly, don't skip a dose for next 3-7 days), max 24 hour daily dose is 6 to 8 tablets or 3000mg  (never more than 4000mg ).  3. Leg swelling - Stop Furosemide (lasix) 40mg  twice a day. You may switch to once a day for a few days, then and STOP completely, and go to once ONLY AS NEEDED. - Try to  Keep compression stockings and elevate your legs (above your heart, or toes above your nose) - Stay active, try to do some regular walking for exercise  Next time may start cholesterol medication  Please schedule a follow-up appointment with Dr. Juanetta GoslingHawkins in 1 month for Diabetes, A1c, BMET followup kidneys  If you have any other questions or concerns, please feel free to call the clinic or send a message through MyChart. You may also schedule an earlier appointment if necessary.  Saralyn PilarAlexander Karter Hellmer, DO [facility]

## 2016-09-29 NOTE — Assessment & Plan Note (Signed)
BP remains well controlled. Recent acute on chronic rise in Cr, suspect related to DM also chronic NSAID and lasix  Plan: 1. Continue Lisinopril-HCTZ 20-25mg  daily 2. Reduce Lasix from 40mg  BID to 40 daily for few days then stop. May use once daily PRN only, consider future half tab use. Especially given controlled BP, not indicated for BP.

## 2016-09-29 NOTE — Assessment & Plan Note (Signed)
Abnormal lipids previously, with HLD in DM Prior on pravastatin Recommended to resume this, has been off for period of time. Concern with financial cost. Will re-address next visit.

## 2016-09-29 NOTE — Progress Notes (Signed)
Subjective:    Patient ID: Harold Crosby, male    DOB: 17-Nov-1971, 45 y.o.   MRN: [id number]  Harold Crosby is a 45 y.o. male presenting on 09/29/2016 for Diabetes   HPI   CHRONIC DM, Type 2: Reports difficulty improving blood sugar, does not seem worse but not improving. CBGs: Avg 140-200 (fasting), 180-200 (evening), lowest 114, highest 289. Checks CBGs twice a day fasting in AM or 2 hr hour eating lunch. Meds: Metformin 1000mg  BID, Glipizide 10mg  BID (on for >20 years), Novolin-N (55u BID wc) and Novolin-R (40 TID wc), will use Lantus instead of Novolin when can afford it, last was 3 weeks, tried 45 twice a day (just got more lantus, able to get supply through friend who gets "too much"). Currently on ACEi - Trying to improve low carb diet, has seen several dieitian in the past, not interested in following up with them again - Drinks regular diet mountain dew and water. - Admits neuropathy in feet with some numbness, tingling and discomfort - Improved on Gabapentin - Admits occasional weakness of bilateral lower extremities, symmetrical Denies hypoglycemia, polyuria, visual changes  CKD-III - Chronic problem with elevated Cr in setting of DM and HTN - Taking Meloxicam 15mg  daily for past few years for osteoarthritis for back and hips, infrequently uses NSAIDs with ibuprofen or aleve. Using heating pad and ice packs PRN. - Denies any decreased urine out put, difficulty voiding, hematuria  HYPERLIPIDEMIA / OBESITY - Last lipid panel 06/2016, with elevated abnormal TG, low HDL and unable to calc LDL - Previously on pravastatin, has been off of this, denied any myalgias or muscle aches - Limited regular exercise due to long work hours - Weight gain +10 lbs in 3 months  CHRONIC HTN: Reports BP has been mostly well controlled Current Meds - Lisinopril-HCTZ 20-25mg  daily Reports good compliance, took meds today. Tolerating well, w/o complaints. - Admits bilateral lower extremity edema for  several years, no prior dx DVT or other abnormality, takes Lasix 40mg  BID with some relief, still taking. Denies CP, dyspnea, HA, dizziness / lightheadedness   Social History  Substance Use Topics  . Smoking status: Current Every Day Smoker    Packs/day: 1.00    Years: 20.00    Types: Cigarettes  . Smokeless tobacco: Never Used     Comment: trying to quit  . Alcohol use No    Review of Systems Per HPI unless specifically indicated above     Objective:    BP 120/79 (BP Location: Left Arm, Patient Position: Sitting, Cuff Size: Large)   Pulse 94   Temp 98.2 F (36.8 C) (Oral)   Resp 16   Ht 5\' 11"  (1.803 m)   Wt 246 lb (111.6 kg)   BMI 34.31 kg/m   Wt Readings from Last 3 Encounters:  09/29/16 246 lb (111.6 kg)  08/31/16 247 lb (112 kg)  07/13/16 245 lb (111.1 kg)    Physical Exam  Constitutional: He appears well-developed and well-nourished. No distress.  Well-appearing, comfortable, cooperative, overweight  Cardiovascular: Normal rate, regular rhythm, normal heart sounds and intact distal pulses.   No murmur heard. Pulmonary/Chest: Effort normal and breath sounds normal. No respiratory distress. He has no wheezes. He has no rales.  Musculoskeletal: He exhibits edema (minimal bilateral non pitting edema, has some light compression stockings on). He exhibits no tenderness.  Symmetrical calves without redness or tenderness  Neurological: He is alert.  Skin: Skin is warm and dry. He is not  diaphoretic.  Nursing note and vitals reviewed.    I have personally reviewed the below lab results from 09/26/16.  Results for orders placed or performed in visit on 09/26/16  BASIC METABOLIC PANEL WITH GFR  Result Value Ref Range   Sodium 138 135 - 146 mmol/L   Potassium 4.7 3.5 - 5.3 mmol/L   Chloride 99 98 - 110 mmol/L   CO2 27 20 - 31 mmol/L   Glucose, Bld 269 (H) 65 - 99 mg/dL   BUN 29 (H) 7 - 25 mg/dL   Creat 1.61 (H) 0.96 - 1.35 mg/dL   Calcium 9.8 8.6 - 04.5 mg/dL    GFR, Est African American 53 (L) >=60 mL/min   GFR, Est Non African American 46 (L) >=60 mL/min      Assessment & Plan:   Problem List Items Addressed This Visit    Hyperlipidemia    Abnormal lipids previously, with HLD in DM Prior on pravastatin Recommended to resume this, has been off for period of time. Concern with financial cost. Will re-address next visit.      Relevant Medications   furosemide (LASIX) 40 MG tablet   Essential hypertension    BP remains well controlled. Recent acute on chronic rise in Cr, suspect related to DM also chronic NSAID and lasix  Plan: 1. Continue Lisinopril-HCTZ 20-25mg  daily 2. Reduce Lasix from 40mg  BID to 40 daily for few days then stop. May use once daily PRN only, consider future half tab use. Especially given controlled BP, not indicated for BP.      Relevant Medications   furosemide (LASIX) 40 MG tablet   Diabetes mellitus type 2, uncontrolled, with complications ([facility]) - Primary    Poor control, last A1c 8.7% (07/13/16), recent chemistry with glucose 269. Recent poor control home CBGs, concern with some changing home meds with switching from Lantus to Novolin, still taking Glipizide 10 BID (concern not effective) Complicated with peripheral neuropathy, retinopathy, and CKD   Plan:  1. Without CBG logs today, difficult to specifically adjust insulin. However discussed if fasting AM CBG >150 persistently, go up on Lantus 1u BID every other day until reach 50 BID, can go up to 55 BID max. May similarly titrate Novolin if decides to switch back. 2. Limited options given no insurance coverage, discussed some alternative DM medications but unable to proceed for patient at this time. Will consider coupon discount for Lantus or Tresiba for long acting insulin, also will provide patient with Med Management [facility] contact to establish for med assistance. 3. Improve DM diet, reduce portions, limit carbs, stop sodas, given limited time for  exercise, but will need start regular exercise. Already been to dietitian 4. Follow-up 1 month for A1c, chemistry, anticipate will need to resume statin in future        CKD stage 3 secondary to diabetes [facility])    Concern with gradual trend of raising Cr. Recently 1.73, recent prior steady trend from 1.4 to 1.6, prior review with baseline previously 1.0-1.3 back in 2015, then significant rise in 2016 as high as 1.7 to 1.8, then improved again. Suspect uncontrolled DM, some component of chronic HTN affecting, also concern with chronic NSAID use of Meloxicam, and regular Lasix use for edema.  Plan: 1. Continue ACE/HCTZ at this time given gradual raise and not considered AKI 2. Improve hydration, limit diet mtn dew, inc water 3. Taper off Lasix, may use PRN only, future consider only half tab 4. Stop Meloxicam, and other NSAID.  Advised on safe Tylenol dosing for back OA 5. Follow-up 1 month, re-check BMET follow Cr trend. If significant still worsening, also depending on DM/HTN control, consider initial Renal Consultation.      Bilateral leg edema    Other Visit Diagnoses   None.     Meds ordered this encounter  Medications  . furosemide (LASIX) 40 MG tablet    Sig: Take 40 mg by mouth daily as needed.      Follow up plan: Return in about 4 weeks (around 10/27/2016) for diabetes, labs A1c, BMET.  Saralyn Pilar, DO [facility] 09/29/2016, 5:28 PM

## 2016-10-06 ENCOUNTER — Ambulatory Visit: Payer: Self-pay

## 2016-10-06 ENCOUNTER — Other Ambulatory Visit: Payer: Self-pay | Admitting: *Deleted

## 2016-10-06 DIAGNOSIS — B20 Human immunodeficiency virus [HIV] disease: Secondary | ICD-10-CM

## 2016-10-06 MED ORDER — EMTRICITAB-RILPIVIR-TENOFOV AF 200-25-25 MG PO TABS: 1.0000 | ORAL_TABLET | Freq: Every day | ORAL | 5 refills | Status: DC

## 2016-10-09 ENCOUNTER — Encounter: Payer: Self-pay | Admitting: Infectious Diseases

## 2017-01-16 ENCOUNTER — Telehealth: Payer: Self-pay | Admitting: *Deleted

## 2017-01-16 ENCOUNTER — Other Ambulatory Visit: Payer: Self-pay | Admitting: Internal Medicine

## 2017-01-16 DIAGNOSIS — B20 Human immunodeficiency virus [HIV] disease: Secondary | ICD-10-CM

## 2017-01-16 MED ORDER — OSELTAMIVIR PHOSPHATE 75 MG PO CAPS: 75.0000 mg | ORAL_CAPSULE | Freq: Every day | ORAL | 0 refills | Status: DC

## 2017-01-16 NOTE — Telephone Encounter (Signed)
Pharmacist called to clarify quantity on tamiflu and per Dr. Orvan Falconerampbell quantity should be seven. Harold Crosby

## 2017-01-16 NOTE — Telephone Encounter (Signed)
Patient called asking for a prescription of Tamiflu.  His significant other, Harold Crosby, is currently hospitalized with the the flu and he states that she was told by her doctor that everyone in the house should be on tamiflu. RN spoke with Dr Orvan Falconerampbell. He sent in a prescription to [facility].  RN notified the patient. He would like to add Harold Crosby as an emergency contact, states that we are allowed to speak to her on his behalf. Harold Crosby, Harold Faughn M, RN

## 2017-01-24 ENCOUNTER — Ambulatory Visit (INDEPENDENT_AMBULATORY_CARE_PROVIDER_SITE_OTHER): Payer: Self-pay | Admitting: Infectious Diseases

## 2017-01-24 ENCOUNTER — Encounter: Payer: Self-pay | Admitting: Infectious Diseases

## 2017-01-24 VITALS — BP 134/84 | HR 92 | Temp 98.0°F | Ht 73.0 in | Wt 240.8 lb

## 2017-01-24 DIAGNOSIS — Z113 Encounter for screening for infections with a predominantly sexual mode of transmission: Secondary | ICD-10-CM

## 2017-01-24 DIAGNOSIS — N183 Chronic kidney disease, stage 3 (moderate): Secondary | ICD-10-CM

## 2017-01-24 DIAGNOSIS — E1165 Type 2 diabetes mellitus with hyperglycemia: Secondary | ICD-10-CM

## 2017-01-24 DIAGNOSIS — Z794 Long term (current) use of insulin: Secondary | ICD-10-CM

## 2017-01-24 DIAGNOSIS — E118 Type 2 diabetes mellitus with unspecified complications: Secondary | ICD-10-CM

## 2017-01-24 DIAGNOSIS — B20 Human immunodeficiency virus [HIV] disease: Secondary | ICD-10-CM

## 2017-01-24 DIAGNOSIS — Z79899 Other long term (current) drug therapy: Secondary | ICD-10-CM

## 2017-01-24 DIAGNOSIS — Z23 Encounter for immunization: Secondary | ICD-10-CM

## 2017-01-24 DIAGNOSIS — IMO0002 Reserved for concepts with insufficient information to code with codable children: Secondary | ICD-10-CM

## 2017-01-24 DIAGNOSIS — E1122 Type 2 diabetes mellitus with diabetic chronic kidney disease: Secondary | ICD-10-CM

## 2017-01-24 LAB — COMPREHENSIVE METABOLIC PANEL
ALT: 39 U/L (ref 9–46)
AST: 17 U/L (ref 10–40)
Albumin: 3.8 g/dL (ref 3.6–5.1)
Alkaline Phosphatase: 73 U/L (ref 40–115)
BILIRUBIN TOTAL: 0.3 mg/dL (ref 0.2–1.2)
BUN: 17 mg/dL (ref 7–25)
CHLORIDE: 103 mmol/L (ref 98–110)
CO2: 25 mmol/L (ref 20–31)
CREATININE: 1.21 mg/dL (ref 0.60–1.35)
Calcium: 9.4 mg/dL (ref 8.6–10.3)
GLUCOSE: 214 mg/dL — AB (ref 65–99)
Potassium: 4.5 mmol/L (ref 3.5–5.3)
SODIUM: 138 mmol/L (ref 135–146)
Total Protein: 6.9 g/dL (ref 6.1–8.1)

## 2017-01-24 LAB — LIPID PANEL
CHOL/HDL RATIO: 6.4 ratio — AB (ref <5.0)
Cholesterol: 187 mg/dL (ref <200)
HDL: 29 mg/dL — ABNORMAL LOW (ref >40)
LDL CALC: 95 mg/dL (ref <100)
TRIGLYCERIDES: 317 mg/dL — AB (ref <150)
VLDL: 63 mg/dL — AB (ref <30)

## 2017-01-24 LAB — CBC
HCT: 44.5 % (ref 38.5–50.0)
Hemoglobin: 15 g/dL (ref 13.2–17.1)
MCH: 32.8 pg (ref 27.0–33.0)
MCHC: 33.7 g/dL (ref 32.0–36.0)
MCV: 97.2 fL (ref 80.0–100.0)
MPV: 9.2 fL (ref 7.5–12.5)
Platelets: 232 10*3/uL (ref 140–400)
RBC: 4.58 MIL/uL (ref 4.20–5.80)
RDW: 13.5 % (ref 11.0–15.0)
WBC: 5.2 10*3/uL (ref 3.8–10.8)

## 2017-01-24 NOTE — Assessment & Plan Note (Addendum)
Will continue his current art (DTGV will interact with metformin).  Needs mening vax Needs flu vax. Will see if we can get him in with med assistance.  Offered/refused condoms.   Will see him back in 5 months.

## 2017-01-24 NOTE — Progress Notes (Signed)
   Subjective:    Patient ID: Harold Crosby, male    DOB: 1971/03/16, 46 y.o.   MRN: [id number]  HPI 46 yo M with HIV+, adm to hospital November 2016 with peri-anal abscess/sepsis and uncontrolled DM2.  He had bedsire I & D on 11-29. His Cx grew enterococcus. He also had increased Cr (now 1.5, with CrCl ~54). FSG have been not "great but not terrible".  He has been on odefsy.  His prev diabetic foot ulcer has healed.   No problems with ART. Was off for 1 week (ADAP lapse) and noted that his leg and foot swelling resolved. When he resumed his swelling can back. He was told he has CKD and was started on rx for this. His swelling has improved but not resolved. Taking Meloxicam sparingly now- twice in last month.   Last ophtho 2-3 months ago.   HIV 1 RNA Quant (copies/mL)  Date Value  03/08/2016 <20  11/21/2015 <20  07/01/2015 <20   CD4 T Cell Abs (/uL)  Date Value  03/08/2016 340 (L)  11/21/2015 280 (L)  07/01/2015 430    Review of Systems  Constitutional: Negative for appetite change and unexpected weight change.  Respiratory: Negative for shortness of breath.   Cardiovascular: Positive for leg swelling.  Gastrointestinal: Negative for constipation and diarrhea.  Genitourinary: Negative for difficulty urinating.  Neurological: Positive for numbness. Negative for dizziness and light-headedness.  oocas neuropathy in LE. Mostly when swollen.      Objective:   Physical Exam  Constitutional: He appears well-developed and well-nourished.  HENT:  Mouth/Throat: No oropharyngeal exudate.  Eyes: EOM are normal. Pupils are equal, round, and reactive to light.  Neck: Neck supple.  Cardiovascular: Normal rate, regular rhythm and normal heart sounds.   Pulmonary/Chest: Effort normal and breath sounds normal.  Abdominal: Soft. Bowel sounds are normal. There is no tenderness. There is no rebound.  Musculoskeletal: He exhibits no edema.  Lymphadenopathy:    He has no cervical adenopathy.        Assessment & Plan:

## 2017-01-24 NOTE — Assessment & Plan Note (Signed)
Greatly appreciate PCP f/u.  Will try to help him get his insulin.

## 2017-01-24 NOTE — Assessment & Plan Note (Signed)
Will check his labs today.  Greatly appreciate PCP eval.

## 2017-01-24 NOTE — Addendum Note (Signed)
Addended by: Linnell FullingBRANNON, Yohanna Tow N on: 01/24/2017 01:29 PM   Modules accepted: Orders, SmartSet

## 2017-01-25 LAB — URINE CYTOLOGY ANCILLARY ONLY
CHLAMYDIA, DNA PROBE: NEGATIVE
Neisseria Gonorrhea: NEGATIVE

## 2017-01-25 LAB — RPR

## 2017-01-25 LAB — T-HELPER CELL (CD4) - (RCID CLINIC ONLY)
CD4 T CELL ABS: 410 /uL (ref 400–2700)
CD4 T CELL HELPER: 33 % (ref 33–55)

## 2017-01-26 LAB — HIV-1 RNA QUANT-NO REFLEX-BLD
HIV 1 RNA Quant: <20 {copies}/mL
HIV-1 RNA Quant, Log: <1.3 {Log_copies}/mL

## 2017-02-07 ENCOUNTER — Other Ambulatory Visit: Payer: Self-pay | Admitting: *Deleted

## 2017-02-07 ENCOUNTER — Other Ambulatory Visit: Payer: Self-pay | Admitting: Infectious Diseases

## 2017-02-07 DIAGNOSIS — B20 Human immunodeficiency virus [HIV] disease: Secondary | ICD-10-CM

## 2017-02-07 MED ORDER — EMTRICITAB-RILPIVIR-TENOFOV AF 200-25-25 MG PO TABS: 1.0000 | ORAL_TABLET | Freq: Every day | ORAL | 1 refills | Status: DC

## 2017-02-12 ENCOUNTER — Encounter: Payer: Self-pay | Admitting: Infectious Diseases

## 2017-03-01 IMAGING — US US SCROTUM
1 series · 14 of 25 positions shown · non-contrast
Comparison: None.

CLINICAL DATA: 44-year-old male with testicular pain.

EXAM:
ULTRASOUND OF SCROTUM
TECHNIQUE: Complete ultrasound examination of the testicles, epididymis, and
other scrotal structures was performed.

[Series 1: us scrotum · 0.06mm/px · 14 of 44 slices shown]
[im 1/44]
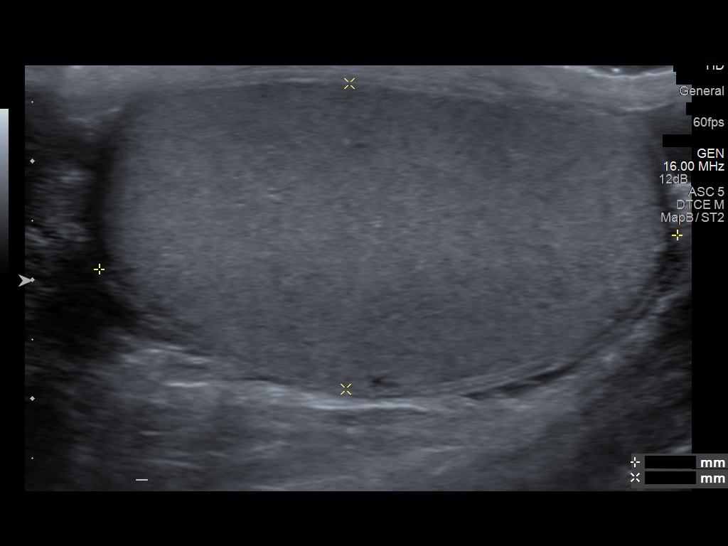
[im 4/44]
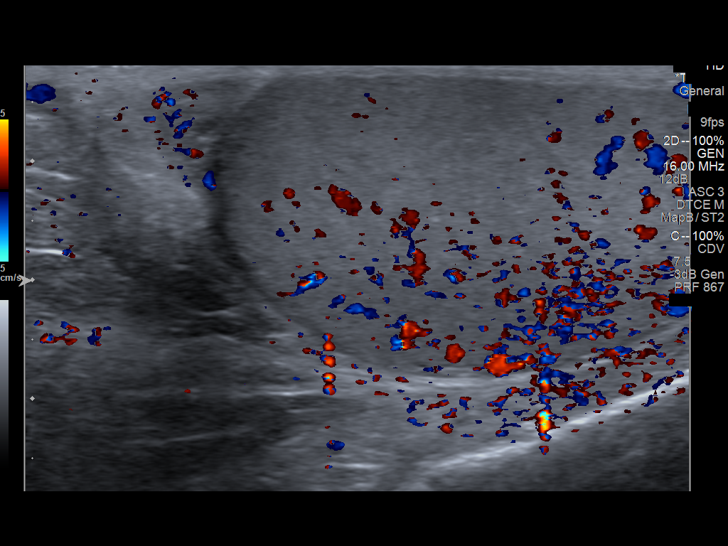
[im 8/44]
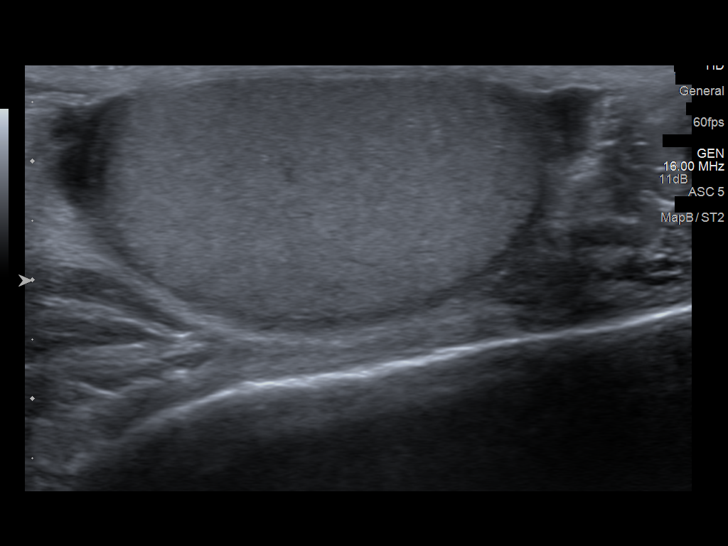
[im 11/44]
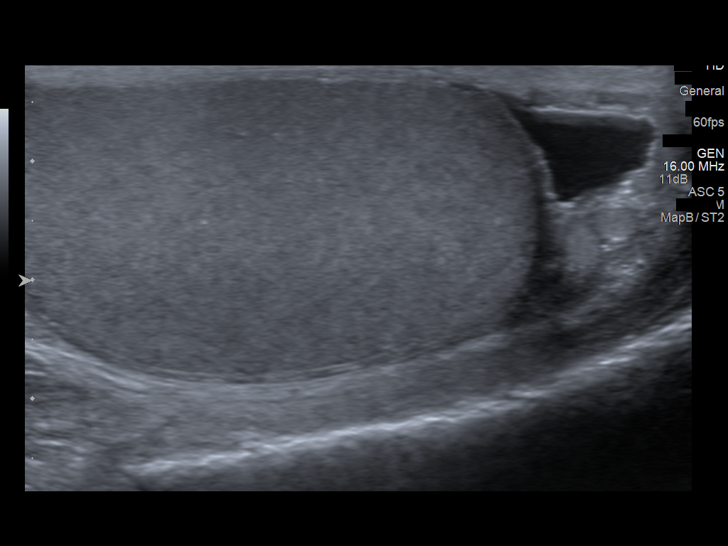
[im 15/44]
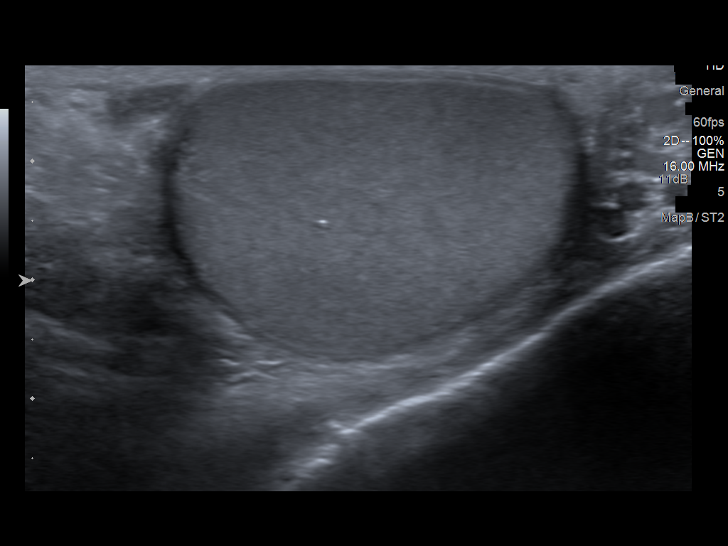
[im 17/44]
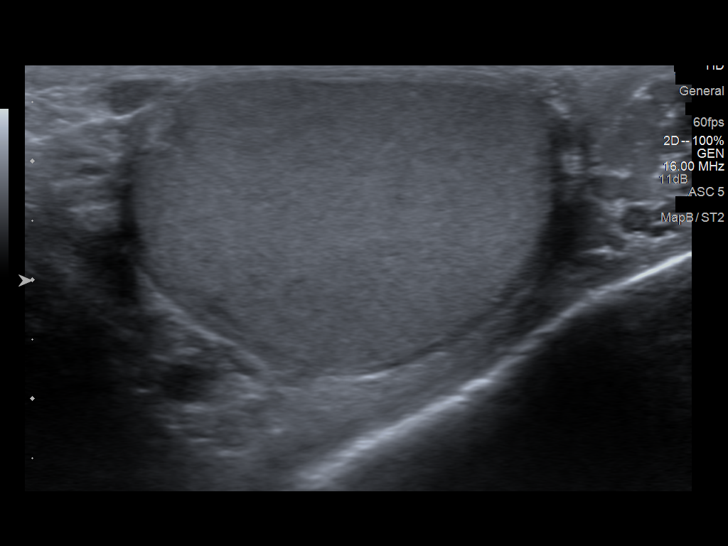
[im 20/44]
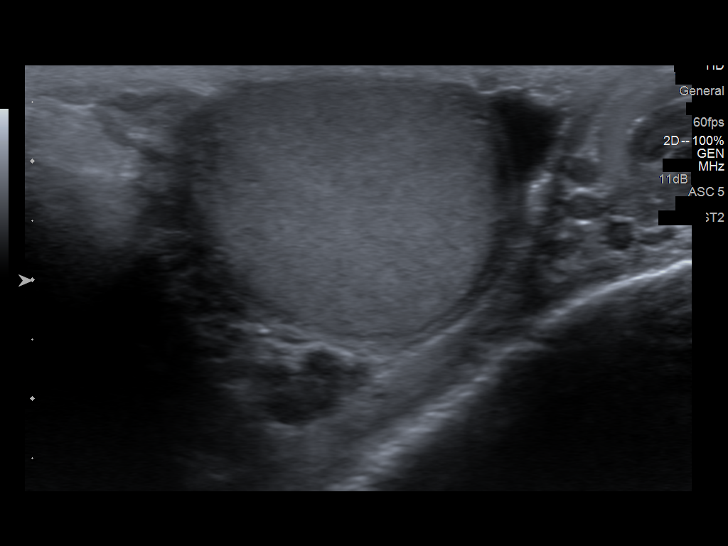
[im 24/44]
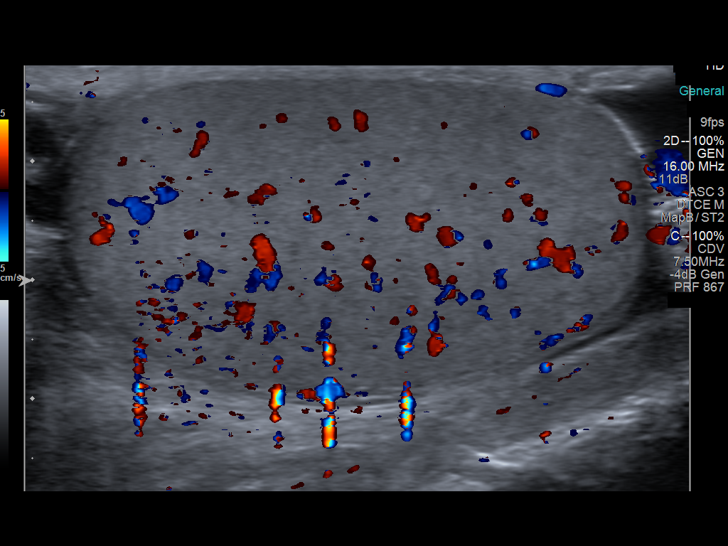
[im 27/44]
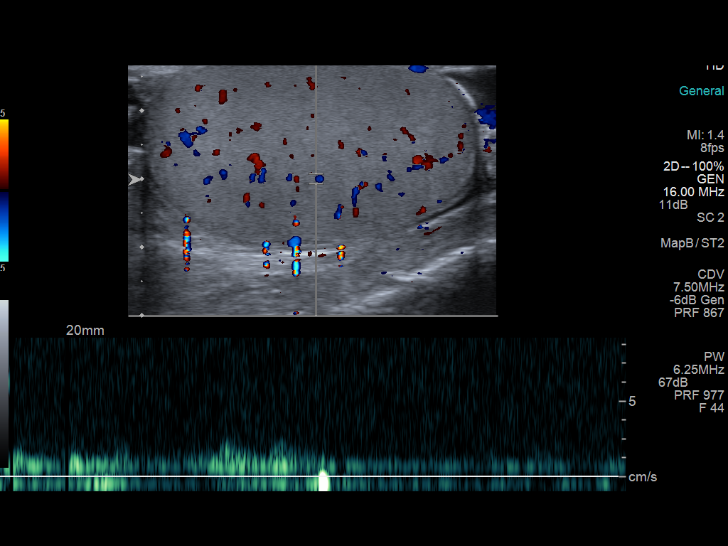
[im 29/44]
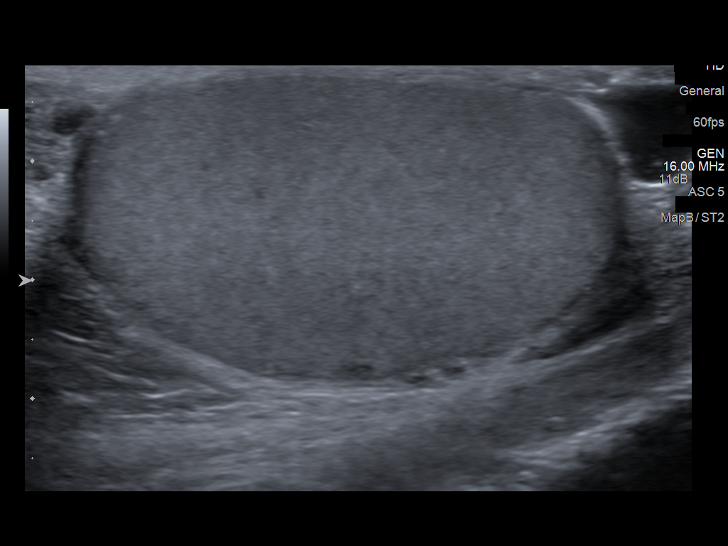
[im 33/44]
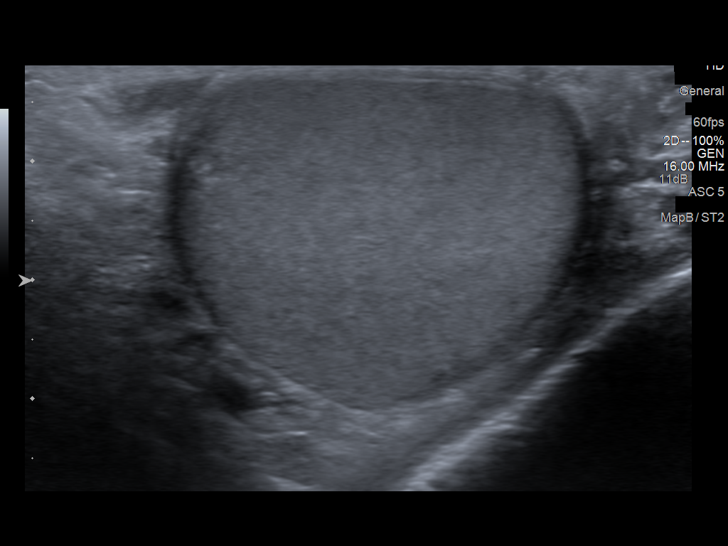
[im 36/44]
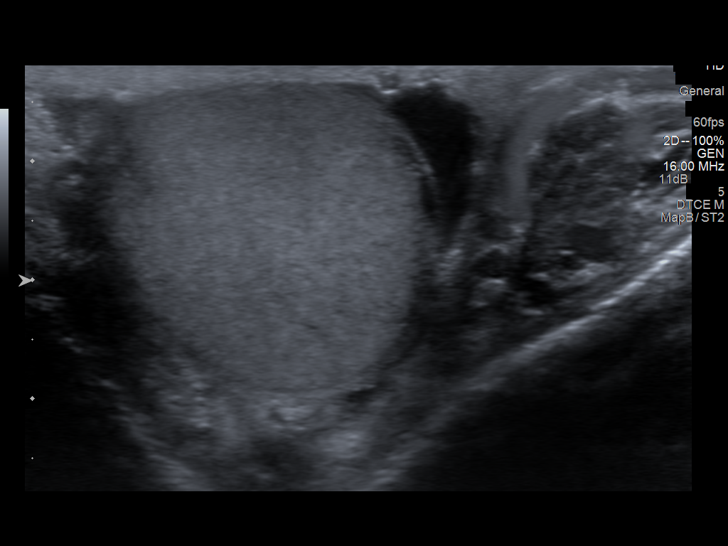
[im 40/44]
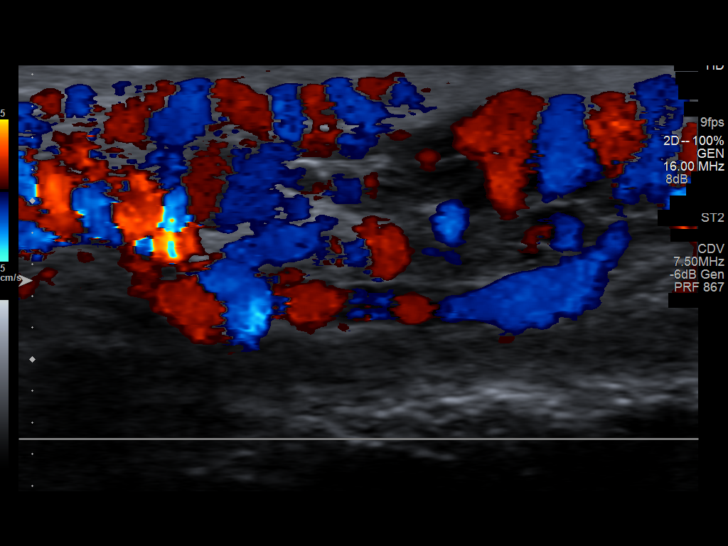
[im 44/44]
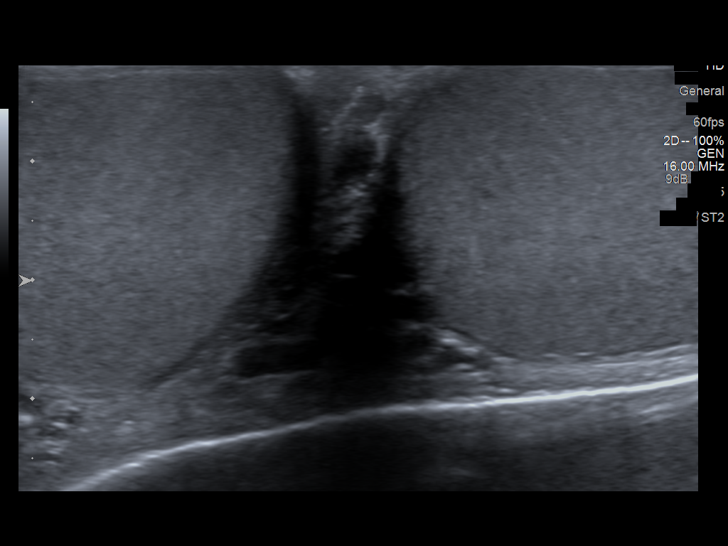

[14 of 25 positions shown; findings below may reference images not displayed]

FINDINGS: Right testicle

Measurements: 4.9 x 2.6 x 3.5 cm. No mass or microlithiasis
visualized.

Left testicle

Measurements: 5.0 x 2.6 x 3.5 cm. No mass or microlithiasis
visualized.

Right epididymis:  Normal in size and appearance.

Left epididymis:  Normal in size and appearance.

Hydrocele:  Small bilateral hydroceles.

Varicocele:  Left-sided varicoceles noted.
IMPRESSION: Left-sided varicoceles otherwise unremarkable testicular ultrasound.

## 2017-04-12 ENCOUNTER — Other Ambulatory Visit: Payer: Self-pay | Admitting: Infectious Diseases

## 2017-04-12 DIAGNOSIS — B20 Human immunodeficiency virus [HIV] disease: Secondary | ICD-10-CM

## 2017-06-28 ENCOUNTER — Ambulatory Visit: Payer: Self-pay

## 2017-06-29 ENCOUNTER — Encounter: Payer: Self-pay | Admitting: Infectious Diseases

## 2017-08-14 ENCOUNTER — Other Ambulatory Visit: Payer: Self-pay | Admitting: Infectious Diseases

## 2017-10-25 ENCOUNTER — Other Ambulatory Visit: Payer: Self-pay | Admitting: Infectious Diseases

## 2017-10-25 DIAGNOSIS — B20 Human immunodeficiency virus [HIV] disease: Secondary | ICD-10-CM

## 2017-11-26 ENCOUNTER — Other Ambulatory Visit: Payer: Self-pay | Admitting: Infectious Diseases

## 2017-11-26 DIAGNOSIS — I10 Essential (primary) hypertension: Secondary | ICD-10-CM

## 2017-11-26 DIAGNOSIS — B20 Human immunodeficiency virus [HIV] disease: Secondary | ICD-10-CM

## 2017-11-28 ENCOUNTER — Other Ambulatory Visit: Payer: Self-pay

## 2017-12-12 ENCOUNTER — Ambulatory Visit (INDEPENDENT_AMBULATORY_CARE_PROVIDER_SITE_OTHER): Payer: Self-pay | Admitting: Infectious Diseases

## 2017-12-12 ENCOUNTER — Encounter: Payer: Self-pay | Admitting: Infectious Diseases

## 2017-12-12 VITALS — BP 171/83 | HR 83 | Temp 98.3°F | Wt 257.0 lb

## 2017-12-12 DIAGNOSIS — I10 Essential (primary) hypertension: Secondary | ICD-10-CM

## 2017-12-12 DIAGNOSIS — E118 Type 2 diabetes mellitus with unspecified complications: Secondary | ICD-10-CM

## 2017-12-12 DIAGNOSIS — E1165 Type 2 diabetes mellitus with hyperglycemia: Secondary | ICD-10-CM

## 2017-12-12 DIAGNOSIS — IMO0002 Reserved for concepts with insufficient information to code with codable children: Secondary | ICD-10-CM

## 2017-12-12 DIAGNOSIS — B20 Human immunodeficiency virus [HIV] disease: Secondary | ICD-10-CM

## 2017-12-12 DIAGNOSIS — Z79899 Other long term (current) drug therapy: Secondary | ICD-10-CM

## 2017-12-12 DIAGNOSIS — F17200 Nicotine dependence, unspecified, uncomplicated: Secondary | ICD-10-CM

## 2017-12-12 DIAGNOSIS — Z113 Encounter for screening for infections with a predominantly sexual mode of transmission: Secondary | ICD-10-CM

## 2017-12-12 LAB — COMPREHENSIVE METABOLIC PANEL
AG RATIO: 1.6 (calc) (ref 1.0–2.5)
ALBUMIN MSPROF: 4 g/dL (ref 3.6–5.1)
ALT: 25 U/L (ref 9–46)
AST: 13 U/L (ref 10–40)
Alkaline phosphatase (APISO): 87 U/L (ref 40–115)
BUN: 19 mg/dL (ref 7–25)
CO2: 28 mmol/L (ref 20–32)
Calcium: 9.8 mg/dL (ref 8.6–10.3)
Chloride: 106 mmol/L (ref 98–110)
Creat: 1.19 mg/dL (ref 0.60–1.35)
GLUCOSE: 131 mg/dL — AB (ref 65–99)
Globulin: 2.5 g/dL (calc) (ref 1.9–3.7)
POTASSIUM: 4.5 mmol/L (ref 3.5–5.3)
SODIUM: 141 mmol/L (ref 135–146)
TOTAL PROTEIN: 6.5 g/dL (ref 6.1–8.1)
Total Bilirubin: 0.2 mg/dL (ref 0.2–1.2)

## 2017-12-12 LAB — CBC
HCT: 47.2 % (ref 38.5–50.0)
HEMOGLOBIN: 16 g/dL (ref 13.2–17.1)
MCH: 32.7 pg (ref 27.0–33.0)
MCHC: 33.9 g/dL (ref 32.0–36.0)
MCV: 96.5 fL (ref 80.0–100.0)
MPV: 9.9 fL (ref 7.5–12.5)
PLATELETS: 197 10*3/uL (ref 140–400)
RBC: 4.89 10*6/uL (ref 4.20–5.80)
RDW: 11.8 % (ref 11.0–15.0)
WBC: 5.6 10*3/uL (ref 3.8–10.8)

## 2017-12-12 NOTE — Assessment & Plan Note (Signed)
appreciate f/u with his PCP

## 2017-12-12 NOTE — Assessment & Plan Note (Signed)
I encouraged him to quit.

## 2017-12-12 NOTE — Progress Notes (Signed)
   Subjective:    Patient ID: Harold Crosby, male    DOB: 05-23-1971, 46 y.o.   MRN: [id number]  HPI 46 yo M with HIV+, adm to hospital November 2016 with peri-anal abscess/sepsis and uncontrolled DM2.  He had bedsire I &D on 11-29. His Cx grew enterococcus.  He has been on odefsy.  He also has a hx of CKD (last Cr 1.21).  Has been busy with work.  DM has been getting better- "constantly adjusting insulin" Continues to smoke.   HIV 1 RNA Quant (copies/mL)  Date Value  01/24/2017 <20 NOT DETECTED  03/08/2016 <20  11/21/2015 <20   CD4 T Cell Abs (/uL)  Date Value  01/24/2017 410  03/08/2016 340 (L)  11/21/2015 280 (L)    Review of Systems  Constitutional: Negative for appetite change, chills, fever and unexpected weight change.  HENT: Positive for hearing loss.   Respiratory: Negative for cough and shortness of breath.   Cardiovascular: Negative for chest pain.  Gastrointestinal: Negative for constipation and diarrhea.  Genitourinary: Negative for difficulty urinating.  Neurological: Positive for numbness. Negative for headaches.  ear feels numb (L). Going to see "specialist" Please see HPI. All other systems reviewed and negative.      Objective:   Physical Exam  Constitutional: He appears well-developed and well-nourished.  HENT:  Right Ear: No drainage, swelling or tenderness.  Left Ear: No drainage, swelling or tenderness. Tympanic membrane is perforated.  Ears:  Mouth/Throat: No oropharyngeal exudate.  Eyes: EOM are normal. Pupils are equal, round, and reactive to light.  Neck: Neck supple.  Cardiovascular: Normal rate, regular rhythm and normal heart sounds.  Pulmonary/Chest: Effort normal and breath sounds normal.  Abdominal: Soft. Bowel sounds are normal. There is no tenderness. There is no rebound.  Musculoskeletal: He exhibits edema.  Lymphadenopathy:    He has no cervical adenopathy.  Psychiatric: He has a normal mood and affect.      Assessment &  Plan:

## 2017-12-12 NOTE — Assessment & Plan Note (Signed)
Appreciate f/u with his PCP I encouraged him to lose wt.

## 2017-12-12 NOTE — Assessment & Plan Note (Signed)
He is doing well Will check his labs today Flu shot today Offered/refused condoms.  rtc in 6 months

## 2017-12-13 LAB — T-HELPER CELL (CD4) - (RCID CLINIC ONLY)
CD4 T CELL ABS: 530 /uL (ref 400–2700)
CD4 T CELL HELPER: 36 % (ref 33–55)

## 2017-12-14 LAB — HIV-1 RNA QUANT-NO REFLEX-BLD
HIV 1 RNA Quant: <20 copies/mL
HIV-1 RNA QUANT, LOG: NOT DETECTED {Log_copies}/mL

## 2017-12-21 ENCOUNTER — Other Ambulatory Visit: Payer: Self-pay | Admitting: Infectious Diseases

## 2017-12-21 DIAGNOSIS — I10 Essential (primary) hypertension: Secondary | ICD-10-CM

## 2017-12-21 DIAGNOSIS — B20 Human immunodeficiency virus [HIV] disease: Secondary | ICD-10-CM

## 2018-01-22 ENCOUNTER — Other Ambulatory Visit: Payer: Self-pay | Admitting: Infectious Diseases

## 2018-01-22 DIAGNOSIS — B20 Human immunodeficiency virus [HIV] disease: Secondary | ICD-10-CM

## 2018-01-22 DIAGNOSIS — I10 Essential (primary) hypertension: Secondary | ICD-10-CM

## 2018-04-01 ENCOUNTER — Ambulatory Visit: Payer: Self-pay

## 2018-04-01 ENCOUNTER — Other Ambulatory Visit: Payer: Self-pay | Admitting: *Deleted

## 2018-04-01 DIAGNOSIS — B20 Human immunodeficiency virus [HIV] disease: Secondary | ICD-10-CM

## 2018-04-01 MED ORDER — EMTRICITAB-RILPIVIR-TENOFOV AF 200-25-25 MG PO TABS: 1.0000 | ORAL_TABLET | Freq: Every day | ORAL | 1 refills | Status: DC

## 2018-04-03 DIAGNOSIS — B20 Human immunodeficiency virus [HIV] disease: Secondary | ICD-10-CM | POA: Insufficient documentation

## 2018-04-03 DIAGNOSIS — E1140 Type 2 diabetes mellitus with diabetic neuropathy, unspecified: Secondary | ICD-10-CM | POA: Insufficient documentation

## 2018-05-29 ENCOUNTER — Other Ambulatory Visit: Payer: Self-pay | Admitting: Infectious Diseases

## 2018-05-29 DIAGNOSIS — B20 Human immunodeficiency virus [HIV] disease: Secondary | ICD-10-CM

## 2018-05-29 DIAGNOSIS — I10 Essential (primary) hypertension: Secondary | ICD-10-CM

## 2018-07-31 ENCOUNTER — Other Ambulatory Visit: Payer: Self-pay | Admitting: Infectious Diseases

## 2018-07-31 DIAGNOSIS — I10 Essential (primary) hypertension: Secondary | ICD-10-CM

## 2018-07-31 DIAGNOSIS — B20 Human immunodeficiency virus [HIV] disease: Secondary | ICD-10-CM

## 2018-08-05 ENCOUNTER — Other Ambulatory Visit: Payer: Self-pay | Admitting: *Deleted

## 2018-08-05 DIAGNOSIS — B20 Human immunodeficiency virus [HIV] disease: Secondary | ICD-10-CM

## 2018-08-05 DIAGNOSIS — I10 Essential (primary) hypertension: Secondary | ICD-10-CM

## 2018-08-05 MED ORDER — LISINOPRIL-HYDROCHLOROTHIAZIDE 20-25 MG PO TABS: 1.0000 | ORAL_TABLET | Freq: Every day | ORAL | 1 refills | Status: DC

## 2018-08-05 MED ORDER — EMTRICITAB-RILPIVIR-TENOFOV AF 200-25-25 MG PO TABS: 1.0000 | ORAL_TABLET | Freq: Every day | ORAL | 1 refills | Status: DC

## 2018-08-07 ENCOUNTER — Ambulatory Visit: Payer: Self-pay

## 2018-08-07 ENCOUNTER — Other Ambulatory Visit: Payer: Self-pay

## 2018-08-07 DIAGNOSIS — B20 Human immunodeficiency virus [HIV] disease: Secondary | ICD-10-CM

## 2018-08-07 DIAGNOSIS — E1165 Type 2 diabetes mellitus with hyperglycemia: Secondary | ICD-10-CM

## 2018-08-07 DIAGNOSIS — IMO0002 Reserved for concepts with insufficient information to code with codable children: Secondary | ICD-10-CM

## 2018-08-07 DIAGNOSIS — Z113 Encounter for screening for infections with a predominantly sexual mode of transmission: Secondary | ICD-10-CM

## 2018-08-07 DIAGNOSIS — Z79899 Other long term (current) drug therapy: Secondary | ICD-10-CM

## 2018-08-07 DIAGNOSIS — E118 Type 2 diabetes mellitus with unspecified complications: Secondary | ICD-10-CM

## 2018-08-08 LAB — T-HELPER CELL (CD4) - (RCID CLINIC ONLY)
CD4 T CELL ABS: 480 /uL (ref 400–2700)
CD4 T CELL HELPER: 36 % (ref 33–55)

## 2018-08-08 LAB — URINE CYTOLOGY ANCILLARY ONLY
CHLAMYDIA, DNA PROBE: NEGATIVE
NEISSERIA GONORRHEA: NEGATIVE

## 2018-08-09 LAB — RPR: RPR: NONREACTIVE

## 2018-08-09 LAB — CBC
HEMATOCRIT: 41.4 % (ref 38.5–50.0)
Hemoglobin: 14.5 g/dL (ref 13.2–17.1)
MCH: 34.3 pg — ABNORMAL HIGH (ref 27.0–33.0)
MCHC: 35 g/dL (ref 32.0–36.0)
MCV: 97.9 fL (ref 80.0–100.0)
MPV: 10 fL (ref 7.5–12.5)
Platelets: 217 10*3/uL (ref 140–400)
RBC: 4.23 10*6/uL (ref 4.20–5.80)
RDW: 13 % (ref 11.0–15.0)
WBC: 5.6 10*3/uL (ref 3.8–10.8)

## 2018-08-09 LAB — LIPID PANEL
CHOL/HDL RATIO: 8.3 (calc) — AB (ref <5.0)
Cholesterol: 240 mg/dL — ABNORMAL HIGH (ref <200)
HDL: 29 mg/dL — ABNORMAL LOW (ref >40)
NON-HDL CHOLESTEROL (CALC): 211 mg/dL — AB (ref <130)
TRIGLYCERIDES: 564 mg/dL — AB (ref <150)

## 2018-08-09 LAB — COMPREHENSIVE METABOLIC PANEL
AG Ratio: 1.7 (calc) (ref 1.0–2.5)
ALT: 31 U/L (ref 9–46)
AST: 19 U/L (ref 10–40)
Albumin: 4.1 g/dL (ref 3.6–5.1)
Alkaline phosphatase (APISO): 80 U/L (ref 40–115)
BUN / CREAT RATIO: 19 (calc) (ref 6–22)
BUN: 29 mg/dL — AB (ref 7–25)
CHLORIDE: 105 mmol/L (ref 98–110)
CO2: 25 mmol/L (ref 20–32)
CREATININE: 1.51 mg/dL — AB (ref 0.60–1.35)
Calcium: 9.2 mg/dL (ref 8.6–10.3)
GLOBULIN: 2.4 g/dL (ref 1.9–3.7)
Glucose, Bld: 180 mg/dL — ABNORMAL HIGH (ref 65–99)
Potassium: 5 mmol/L (ref 3.5–5.3)
Sodium: 140 mmol/L (ref 135–146)
Total Bilirubin: 0.2 mg/dL (ref 0.2–1.2)
Total Protein: 6.5 g/dL (ref 6.1–8.1)

## 2018-08-09 LAB — HIV-1 RNA QUANT-NO REFLEX-BLD
HIV 1 RNA QUANT: NOT DETECTED {copies}/mL
HIV-1 RNA Quant, Log: <1.3 Log copies/mL

## 2018-08-20 ENCOUNTER — Encounter: Payer: Self-pay | Admitting: Infectious Diseases

## 2018-08-28 ENCOUNTER — Encounter: Payer: Self-pay | Admitting: Infectious Diseases

## 2018-08-28 ENCOUNTER — Ambulatory Visit (INDEPENDENT_AMBULATORY_CARE_PROVIDER_SITE_OTHER): Payer: Self-pay | Admitting: Infectious Diseases

## 2018-08-28 VITALS — BP 123/90 | HR 80 | Temp 98.4°F | Wt 246.0 lb

## 2018-08-28 DIAGNOSIS — B20 Human immunodeficiency virus [HIV] disease: Secondary | ICD-10-CM

## 2018-08-28 DIAGNOSIS — E1165 Type 2 diabetes mellitus with hyperglycemia: Secondary | ICD-10-CM

## 2018-08-28 DIAGNOSIS — L97509 Non-pressure chronic ulcer of other part of unspecified foot with unspecified severity: Secondary | ICD-10-CM

## 2018-08-28 DIAGNOSIS — N183 Chronic kidney disease, stage 3 unspecified: Secondary | ICD-10-CM

## 2018-08-28 DIAGNOSIS — I10 Essential (primary) hypertension: Secondary | ICD-10-CM

## 2018-08-28 DIAGNOSIS — E08621 Diabetes mellitus due to underlying condition with foot ulcer: Secondary | ICD-10-CM

## 2018-08-28 DIAGNOSIS — Z23 Encounter for immunization: Secondary | ICD-10-CM

## 2018-08-28 DIAGNOSIS — E118 Type 2 diabetes mellitus with unspecified complications: Secondary | ICD-10-CM

## 2018-08-28 DIAGNOSIS — Z79899 Other long term (current) drug therapy: Secondary | ICD-10-CM

## 2018-08-28 DIAGNOSIS — F17200 Nicotine dependence, unspecified, uncomplicated: Secondary | ICD-10-CM

## 2018-08-28 DIAGNOSIS — IMO0002 Reserved for concepts with insufficient information to code with codable children: Secondary | ICD-10-CM

## 2018-08-28 DIAGNOSIS — E1122 Type 2 diabetes mellitus with diabetic chronic kidney disease: Secondary | ICD-10-CM

## 2018-08-28 DIAGNOSIS — Z113 Encounter for screening for infections with a predominantly sexual mode of transmission: Secondary | ICD-10-CM

## 2018-08-28 MED ORDER — NICOTINE 21 MG/24HR TD PT24: 21.0000 mg | MEDICATED_PATCH | Freq: Every day | TRANSDERMAL | 0 refills | Status: AC

## 2018-08-28 MED ORDER — NICOTINE 14 MG/24HR TD PT24: 14.0000 mg | MEDICATED_PATCH | Freq: Every day | TRANSDERMAL | 0 refills | Status: AC

## 2018-08-28 MED ORDER — NICOTINE 7 MG/24HR TD PT24: 7.0000 mg | MEDICATED_PATCH | Freq: Every day | TRANSDERMAL | 0 refills | Status: AC

## 2018-08-28 NOTE — Progress Notes (Signed)
   Subjective:    Patient ID: Harold Crosby, male    DOB: 1971-04-18, 47 y.o.   MRN: [id number]  HPI 47 yo M with HIV+, adm to hospital November 2016 with peri-anal abscess/sepsis and uncontrolled DM2.  He has been on odefsy.  He also has a hx of CKD (last Cr 1.51).  Smoking 1ppd. Did cut back but then increased again.  Has lost 10# since last visit- not sure how.  Last A1C was 7.1%.   HIV 1 RNA Quant (copies/mL)  Date Value  08/07/2018 <20 NOT DETECTED  12/12/2017 <20 NOT DETECTED  01/24/2017 <20 NOT DETECTED   CD4 T Cell Abs (/uL)  Date Value  08/07/2018 480  12/12/2017 530  01/24/2017 410   Has ophtho appt set up.   Review of Systems  Constitutional: Negative for appetite change and unexpected weight change.  Eyes: Negative for visual disturbance.  Respiratory: Negative for shortness of breath.   Cardiovascular: Negative for chest pain.  Gastrointestinal: Negative for constipation and diarrhea.  Genitourinary: Negative for difficulty urinating.  Musculoskeletal: Positive for back pain.  Skin: Negative for wound.  Neurological: Positive for weakness (BLE. had scan showing narrowing of C-spine canal.) and numbness. Negative for headaches.  Psychiatric/Behavioral: Negative for sleep disturbance.  Please see HPI. All other systems reviewed and negative.      Objective:   Physical Exam  Constitutional: He is oriented to person, place, and time. He appears well-developed and well-nourished.  HENT:  Mouth/Throat: No oropharyngeal exudate.  Eyes: Pupils are equal, round, and reactive to light. EOM are normal.  Neck: Normal range of motion. Neck supple.  Cardiovascular: Normal rate, regular rhythm and normal heart sounds.  Pulmonary/Chest: Effort normal and breath sounds normal.  Abdominal: Soft. Bowel sounds are normal. He exhibits no distension. There is no tenderness.  Musculoskeletal: He exhibits no edema.  Lymphadenopathy:    He has no cervical adenopathy.    Neurological: He is alert and oriented to person, place, and time.  Skin: Skin is warm and dry.  Psychiatric: He has a normal mood and affect.      Assessment & Plan:

## 2018-08-28 NOTE — Assessment & Plan Note (Signed)
He is doing well Flu shot today.  Offered/refused condoms.  He has had PCV.  schedule for colon next year.  rtc in 9 months.

## 2018-08-28 NOTE — Assessment & Plan Note (Signed)
BP well controlled today, appreciate PCP f/u.

## 2018-08-28 NOTE — Assessment & Plan Note (Signed)
Will write him for nicotine patches.  He is willing to try.

## 2018-08-28 NOTE — Assessment & Plan Note (Signed)
He appears to be doing well although his reported A1C is high.  Appreciate his PCP f/u.

## 2018-08-28 NOTE — Assessment & Plan Note (Addendum)
He currently has no lesions on his feet with exception of onychomycosis.  Will mark as resolved.

## 2018-08-28 NOTE — Assessment & Plan Note (Signed)
He has mild increase in his Cr.  Will have him see renal.

## 2018-09-23 ENCOUNTER — Other Ambulatory Visit: Payer: Self-pay | Admitting: Infectious Diseases

## 2018-09-23 DIAGNOSIS — I10 Essential (primary) hypertension: Secondary | ICD-10-CM

## 2018-09-23 DIAGNOSIS — B20 Human immunodeficiency virus [HIV] disease: Secondary | ICD-10-CM

## 2019-02-25 ENCOUNTER — Ambulatory Visit: Payer: Self-pay

## 2019-02-26 ENCOUNTER — Encounter: Payer: Self-pay | Admitting: Infectious Diseases

## 2019-04-07 ENCOUNTER — Other Ambulatory Visit: Payer: Self-pay | Admitting: Infectious Diseases

## 2019-04-07 DIAGNOSIS — I10 Essential (primary) hypertension: Secondary | ICD-10-CM

## 2019-04-07 DIAGNOSIS — B20 Human immunodeficiency virus [HIV] disease: Secondary | ICD-10-CM

## 2019-05-15 ENCOUNTER — Other Ambulatory Visit: Payer: Self-pay

## 2019-05-29 ENCOUNTER — Encounter: Payer: Self-pay | Admitting: Infectious Diseases

## 2019-05-30 ENCOUNTER — Other Ambulatory Visit: Payer: Self-pay

## 2019-06-02 ENCOUNTER — Other Ambulatory Visit: Payer: Self-pay

## 2019-06-02 DIAGNOSIS — B20 Human immunodeficiency virus [HIV] disease: Secondary | ICD-10-CM

## 2019-06-02 DIAGNOSIS — Z79899 Other long term (current) drug therapy: Secondary | ICD-10-CM

## 2019-06-02 DIAGNOSIS — Z113 Encounter for screening for infections with a predominantly sexual mode of transmission: Secondary | ICD-10-CM

## 2019-06-03 LAB — URINE CYTOLOGY ANCILLARY ONLY
Chlamydia: NEGATIVE
Neisseria Gonorrhea: NEGATIVE

## 2019-06-03 LAB — T-HELPER CELL (CD4) - (RCID CLINIC ONLY)
CD4 % Helper T Cell: 39 % (ref 33–65)
CD4 T Cell Abs: 428 /uL (ref 400–1790)

## 2019-06-13 ENCOUNTER — Encounter: Payer: Self-pay | Admitting: Infectious Diseases

## 2019-06-14 LAB — COMPREHENSIVE METABOLIC PANEL
AG Ratio: 1.6 (calc) (ref 1.0–2.5)
ALT: 25 U/L (ref 9–46)
AST: 12 U/L (ref 10–35)
Albumin: 4.2 g/dL (ref 3.6–5.1)
Alkaline phosphatase (APISO): 84 U/L (ref 35–144)
BUN: 19 mg/dL (ref 7–25)
CO2: 30 mmol/L (ref 20–32)
Calcium: 10.4 mg/dL — ABNORMAL HIGH (ref 8.6–10.3)
Chloride: 104 mmol/L (ref 98–110)
Creat: 1.32 mg/dL (ref 0.70–1.33)
Globulin: 2.7 g/dL (calc) (ref 1.9–3.7)
Glucose, Bld: 163 mg/dL — ABNORMAL HIGH (ref 65–99)
Potassium: 5.3 mmol/L (ref 3.5–5.3)
Sodium: 141 mmol/L (ref 135–146)
Total Bilirubin: 0.3 mg/dL (ref 0.2–1.2)
Total Protein: 6.9 g/dL (ref 6.1–8.1)

## 2019-06-14 LAB — CBC
HCT: 45 % (ref 38.5–50.0)
Hemoglobin: 15.5 g/dL (ref 13.2–17.1)
MCH: 33.5 pg — ABNORMAL HIGH (ref 27.0–33.0)
MCHC: 34.4 g/dL (ref 32.0–36.0)
MCV: 97.4 fL (ref 80.0–100.0)
MPV: 9.7 fL (ref 7.5–12.5)
Platelets: 248 10*3/uL (ref 140–400)
RBC: 4.62 10*6/uL (ref 4.20–5.80)
RDW: 12.6 % (ref 11.0–15.0)
WBC: 4.5 10*3/uL (ref 3.8–10.8)

## 2019-06-14 LAB — HIV-1 RNA QUANT-NO REFLEX-BLD
HIV 1 RNA Quant: <20 copies/mL
HIV-1 RNA Quant, Log: <1.3 Log copies/mL

## 2019-06-14 LAB — LIPID PANEL
Cholesterol: 270 mg/dL — ABNORMAL HIGH (ref <200)
HDL: 37 mg/dL — ABNORMAL LOW (ref >=40)
Non-HDL Cholesterol (Calc): 233 mg/dL (calc) — ABNORMAL HIGH (ref <130)
Total CHOL/HDL Ratio: 7.3 (calc) — ABNORMAL HIGH (ref <5.0)
Triglycerides: 417 mg/dL — ABNORMAL HIGH (ref <150)

## 2019-06-14 LAB — RPR: RPR Ser Ql: NONREACTIVE

## 2019-06-16 ENCOUNTER — Telehealth: Payer: Self-pay | Admitting: Infectious Diseases

## 2019-06-16 NOTE — Telephone Encounter (Signed)
COVID-19 Pre-Screening Questions: ° °Do you currently have a fever (>100 °F), chills or unexplained body aches? No  ° °Are you currently experiencing new cough, shortness of breath, sore throat, runny nose? No  °•  °Have you recently travelled outside the state of [place] in the last 14 days? No  °•  °1. Have you been in contact with someone that is currently pending confirmation of Covid19 testing or has been confirmed to have the Covid19 virus?  No  ° °

## 2019-06-17 ENCOUNTER — Ambulatory Visit (INDEPENDENT_AMBULATORY_CARE_PROVIDER_SITE_OTHER): Payer: Self-pay | Admitting: Infectious Diseases

## 2019-06-17 ENCOUNTER — Other Ambulatory Visit: Payer: Self-pay

## 2019-06-17 ENCOUNTER — Encounter: Payer: Self-pay | Admitting: Infectious Diseases

## 2019-06-17 VITALS — BP 132/86 | HR 93 | Temp 98.0°F | Ht 73.0 in | Wt 256.0 lb

## 2019-06-17 DIAGNOSIS — N183 Chronic kidney disease, stage 3 unspecified: Secondary | ICD-10-CM

## 2019-06-17 DIAGNOSIS — F17200 Nicotine dependence, unspecified, uncomplicated: Secondary | ICD-10-CM

## 2019-06-17 DIAGNOSIS — E118 Type 2 diabetes mellitus with unspecified complications: Secondary | ICD-10-CM

## 2019-06-17 DIAGNOSIS — Z79899 Other long term (current) drug therapy: Secondary | ICD-10-CM

## 2019-06-17 DIAGNOSIS — E1165 Type 2 diabetes mellitus with hyperglycemia: Secondary | ICD-10-CM

## 2019-06-17 DIAGNOSIS — B20 Human immunodeficiency virus [HIV] disease: Secondary | ICD-10-CM

## 2019-06-17 DIAGNOSIS — IMO0002 Reserved for concepts with insufficient information to code with codable children: Secondary | ICD-10-CM

## 2019-06-17 DIAGNOSIS — Z113 Encounter for screening for infections with a predominantly sexual mode of transmission: Secondary | ICD-10-CM

## 2019-06-17 DIAGNOSIS — E1122 Type 2 diabetes mellitus with diabetic chronic kidney disease: Secondary | ICD-10-CM

## 2019-06-17 NOTE — Assessment & Plan Note (Signed)
Encouraged to quit. 

## 2019-06-17 NOTE — Assessment & Plan Note (Signed)
stable °

## 2019-06-17 NOTE — Assessment & Plan Note (Signed)
Doing well on odefsy.  Offered/refused condoms.  vax are up to date.  Has not had colon yet. His PCP will arrange.  rtc in 9 months with labs prior.

## 2019-06-17 NOTE — Assessment & Plan Note (Signed)
He has PCP that he follows with in [place].  No change in insulin recently.  Saw has seen eye dr this year.

## 2019-06-17 NOTE — Progress Notes (Signed)
   Subjective:    Patient ID: Harold Crosby, male    DOB: 1971-03-17, 48 y.o.   MRN: [id number]  HPI 48yo M with HIV+, adm to hospital November 2016 with peri-anal abscess/sepsis and uncontrolled DM2.  He has been on odefsy. He also has a hx of CKD (last Cr 1.32).  Smoking 1ppd. Did cut back but then increased again.  Last A1C was 7.1%. He is currently unable to remember last #.  Since his last visit he has had cervical fusion. His UE numbness improved after this. 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 Surgery pending.   No problem with ART.  No change in his smoking.  wt has been going up.   HIV 1 RNA Quant (copies/mL)  Date Value  06/02/2019 <20 NOT DETECTED  08/07/2018 <20 NOT DETECTED  12/12/2017 <20 NOT DETECTED   CD4 T Cell Abs (/uL)  Date Value  06/02/2019 428  08/07/2018 480  12/12/2017 530    Review of Systems  Constitutional: Negative for chills and fever.  Respiratory: Negative for cough and shortness of breath.   Gastrointestinal: Negative for constipation and diarrhea.  Genitourinary: Negative for difficulty urinating.  Neurological: Positive for numbness.  Psychiatric/Behavioral: Negative for sleep disturbance.       Objective:   Physical Exam Constitutional:      Appearance: Normal appearance.  HENT:     Mouth/Throat:     Mouth: Mucous membranes are moist.     Pharynx: No oropharyngeal exudate.  Eyes:     Extraocular Movements: Extraocular movements intact.     Pupils: Pupils are equal, round, and reactive to light.  Neck:     Musculoskeletal: Normal range of motion and neck supple.  Cardiovascular:     Rate and Rhythm: Normal rate.  Pulmonary:     Effort: Pulmonary effort is normal.     Breath sounds: Normal breath sounds.  Abdominal:     General: Bowel sounds are normal. There is no distension.  Musculoskeletal: Normal range of motion.     Right lower leg: Edema present.     Left lower leg: Edema present.   Neurological:     General: No focal deficit present.     Mental Status: He is alert.  Psychiatric:        Mood and Affect: Mood normal.           Assessment & Plan:

## 2019-07-14 ENCOUNTER — Ambulatory Visit: Payer: Self-pay

## 2019-07-14 ENCOUNTER — Encounter: Payer: Self-pay | Admitting: Infectious Diseases

## 2019-07-14 ENCOUNTER — Other Ambulatory Visit: Payer: Self-pay

## 2019-09-03 ENCOUNTER — Other Ambulatory Visit: Payer: Self-pay | Admitting: Infectious Diseases

## 2019-09-03 DIAGNOSIS — B20 Human immunodeficiency virus [HIV] disease: Secondary | ICD-10-CM

## 2019-09-03 DIAGNOSIS — I10 Essential (primary) hypertension: Secondary | ICD-10-CM

## 2019-11-17 DIAGNOSIS — M48062 Spinal stenosis, lumbar region with neurogenic claudication: Secondary | ICD-10-CM | POA: Insufficient documentation

## 2020-02-05 ENCOUNTER — Encounter: Payer: Self-pay | Admitting: Infectious Diseases

## 2020-02-24 ENCOUNTER — Other Ambulatory Visit: Payer: Self-pay | Admitting: Infectious Diseases

## 2020-02-24 DIAGNOSIS — B20 Human immunodeficiency virus [HIV] disease: Secondary | ICD-10-CM

## 2020-02-24 DIAGNOSIS — I10 Essential (primary) hypertension: Secondary | ICD-10-CM

## 2020-04-16 ENCOUNTER — Other Ambulatory Visit: Payer: Self-pay | Admitting: Infectious Diseases

## 2020-04-16 DIAGNOSIS — B20 Human immunodeficiency virus [HIV] disease: Secondary | ICD-10-CM

## 2020-04-16 DIAGNOSIS — I10 Essential (primary) hypertension: Secondary | ICD-10-CM

## 2020-04-20 ENCOUNTER — Other Ambulatory Visit: Payer: Self-pay

## 2020-04-20 DIAGNOSIS — B20 Human immunodeficiency virus [HIV] disease: Secondary | ICD-10-CM

## 2020-04-20 MED ORDER — ODEFSEY 200-25-25 MG PO TABS: ORAL_TABLET | ORAL | 0 refills | Status: DC

## 2020-04-22 ENCOUNTER — Other Ambulatory Visit: Payer: Self-pay

## 2020-04-22 DIAGNOSIS — Z113 Encounter for screening for infections with a predominantly sexual mode of transmission: Secondary | ICD-10-CM

## 2020-04-22 DIAGNOSIS — Z79899 Other long term (current) drug therapy: Secondary | ICD-10-CM

## 2020-04-22 DIAGNOSIS — B20 Human immunodeficiency virus [HIV] disease: Secondary | ICD-10-CM

## 2020-04-22 NOTE — Addendum Note (Signed)
Addended by: Mariea Clonts D on: 04/22/2020 10:35 AM   Modules accepted: Orders

## 2020-04-23 LAB — CBC
HCT: 39.2 % (ref 38.5–50.0)
Hemoglobin: 13.5 g/dL (ref 13.2–17.1)
MCH: 35.2 pg — ABNORMAL HIGH (ref 27.0–33.0)
MCHC: 34.4 g/dL (ref 32.0–36.0)
MCV: 102.3 fL — ABNORMAL HIGH (ref 80.0–100.0)
MPV: 10.6 fL (ref 7.5–12.5)
Platelets: 207 10*3/uL (ref 140–400)
RBC: 3.83 10*6/uL — ABNORMAL LOW (ref 4.20–5.80)
RDW: 13.1 % (ref 11.0–15.0)
WBC: 7.2 10*3/uL (ref 3.8–10.8)

## 2020-04-23 LAB — T-HELPER CELL (CD4) - (RCID CLINIC ONLY)
CD4 % Helper T Cell: 38 % (ref 33–65)
CD4 T Cell Abs: 483 /uL (ref 400–1790)

## 2020-04-23 LAB — COMPREHENSIVE METABOLIC PANEL
AG Ratio: 1.8 (calc) (ref 1.0–2.5)
ALT: 77 U/L — ABNORMAL HIGH (ref 9–46)
AST: 37 U/L — ABNORMAL HIGH (ref 10–35)
Albumin: 4.2 g/dL (ref 3.6–5.1)
Alkaline phosphatase (APISO): 59 U/L (ref 35–144)
BUN/Creatinine Ratio: 22 (calc) (ref 6–22)
BUN: 43 mg/dL — ABNORMAL HIGH (ref 7–25)
CO2: 26 mmol/L (ref 20–32)
Calcium: 9.9 mg/dL (ref 8.6–10.3)
Chloride: 98 mmol/L (ref 98–110)
Creat: 1.99 mg/dL — ABNORMAL HIGH (ref 0.70–1.33)
Globulin: 2.4 g/dL (calc) (ref 1.9–3.7)
Glucose, Bld: 309 mg/dL — ABNORMAL HIGH (ref 65–99)
Potassium: 5 mmol/L (ref 3.5–5.3)
Sodium: 135 mmol/L (ref 135–146)
Total Bilirubin: 0.3 mg/dL (ref 0.2–1.2)
Total Protein: 6.6 g/dL (ref 6.1–8.1)

## 2020-04-23 LAB — LIPID PANEL
Cholesterol: 275 mg/dL — ABNORMAL HIGH (ref <200)
HDL: 35 mg/dL — ABNORMAL LOW (ref >=40)
Non-HDL Cholesterol (Calc): 240 mg/dL (calc) — ABNORMAL HIGH (ref <130)
Total CHOL/HDL Ratio: 7.9 (calc) — ABNORMAL HIGH (ref <5.0)
Triglycerides: 751 mg/dL — ABNORMAL HIGH (ref <150)

## 2020-04-23 LAB — RPR: RPR Ser Ql: NONREACTIVE

## 2020-04-23 LAB — HIV-1 RNA QUANT-NO REFLEX-BLD
HIV 1 RNA Quant: <20 copies/mL — AB
HIV-1 RNA Quant, Log: <1.3 Log copies/mL — AB

## 2020-05-11 ENCOUNTER — Encounter: Payer: Self-pay | Admitting: Infectious Diseases

## 2020-05-11 ENCOUNTER — Other Ambulatory Visit: Payer: Self-pay

## 2020-05-11 ENCOUNTER — Ambulatory Visit (INDEPENDENT_AMBULATORY_CARE_PROVIDER_SITE_OTHER): Payer: Self-pay | Admitting: Infectious Diseases

## 2020-05-11 VITALS — BP 109/73 | HR 93 | Temp 97.9°F | Wt 269.0 lb

## 2020-05-11 DIAGNOSIS — B20 Human immunodeficiency virus [HIV] disease: Secondary | ICD-10-CM

## 2020-05-11 DIAGNOSIS — IMO0002 Reserved for concepts with insufficient information to code with codable children: Secondary | ICD-10-CM

## 2020-05-11 DIAGNOSIS — Z113 Encounter for screening for infections with a predominantly sexual mode of transmission: Secondary | ICD-10-CM

## 2020-05-11 DIAGNOSIS — F17200 Nicotine dependence, unspecified, uncomplicated: Secondary | ICD-10-CM

## 2020-05-11 DIAGNOSIS — Z79899 Other long term (current) drug therapy: Secondary | ICD-10-CM

## 2020-05-11 DIAGNOSIS — E118 Type 2 diabetes mellitus with unspecified complications: Secondary | ICD-10-CM

## 2020-05-11 DIAGNOSIS — N183 Chronic kidney disease, stage 3 unspecified: Secondary | ICD-10-CM

## 2020-05-11 DIAGNOSIS — I10 Essential (primary) hypertension: Secondary | ICD-10-CM

## 2020-05-11 DIAGNOSIS — E1165 Type 2 diabetes mellitus with hyperglycemia: Secondary | ICD-10-CM

## 2020-05-11 DIAGNOSIS — E1122 Type 2 diabetes mellitus with diabetic chronic kidney disease: Secondary | ICD-10-CM

## 2020-05-11 NOTE — Assessment & Plan Note (Signed)
He is doing well from HIV perspective.  I encouraged him to get COVID vax.  Offered to set up his colon, will send referral.  Watch his Cr, hold on adjusting his ART at this point to see if Cr improves.  rtc in 6 months.  Offered/refused condoms.

## 2020-05-11 NOTE — Progress Notes (Signed)
   Subjective:    Patient ID: Harold Crosby, male    DOB: January 06, 1971, 49 y.o.   MRN: [id number]  HPI 49yo M with HIV+, adm to hospital November 2016 with peri-anal abscess/sepsis and uncontrolled DM2.  He has been on odefsy. He also has a hx of CKD (last Cr 1.32). Smoking 1/4 to 1/2ppd. Last A1C was 11.6% 10-2019 He had cervical fusion 12-2018 then lumbar fusion 10-2019 [facility]) He has had loss of sensation and strength in his hands and feet.   HIV 1 RNA Quant (copies/mL)  Date Value  04/22/2020 <20 DETECTED (A)  06/02/2019 <20 NOT DETECTED  08/07/2018 <20 NOT DETECTED   CD4 T Cell Abs (/uL)  Date Value  04/22/2020 483  06/02/2019 428  08/07/2018 480   No problems with his ART, likes much better than atripla (2018).  Wt has been going up, FSG have been "ok". Has been using his wt room in his house.   Review of Systems  Constitutional: Positive for unexpected weight change. Negative for appetite change.  Respiratory: Positive for shortness of breath (DOE).   Cardiovascular: Positive for leg swelling. Negative for chest pain.  Gastrointestinal: Negative for diarrhea and nausea.  Genitourinary: Positive for difficulty urinating (hesitency).  Neurological: Positive for numbness.       Objective:   Physical Exam Vitals reviewed.  Constitutional:      Appearance: Normal appearance. He is obese.  HENT:     Mouth/Throat:     Mouth: Mucous membranes are moist.     Pharynx: No oropharyngeal exudate.  Eyes:     Extraocular Movements: Extraocular movements intact.     Pupils: Pupils are equal, round, and reactive to light.  Cardiovascular:     Rate and Rhythm: Normal rate and regular rhythm.  Pulmonary:     Effort: Pulmonary effort is normal.     Breath sounds: Normal breath sounds.  Abdominal:     General: Bowel sounds are normal. There is no distension.     Palpations: Abdomen is soft.     Tenderness: There is no abdominal tenderness.  Musculoskeletal:     Cervical back:  Normal range of motion and neck supple.     Right lower leg: Edema present.     Left lower leg: Edema present.     Right foot: Normal range of motion. No deformity, bunion, Charcot foot, foot drop or prominent metatarsal heads.     Left foot: Normal range of motion. No deformity, bunion, Charcot foot, foot drop or prominent metatarsal heads.       Feet:  Feet:     Right foot:     Protective Sensation: 2 sites tested. 2 sites sensed.     Skin integrity: Skin integrity normal.     Toenail Condition: Right toenails are abnormally thick.     Left foot:     Protective Sensation: 2 sites tested. 2 sites sensed.     Skin integrity: Skin integrity normal.     Toenail Condition: Left toenails are normal.  Neurological:     Mental Status: He is alert and oriented to person, place, and time.     Sensory: Sensory deficit present.           Assessment & Plan:

## 2020-05-11 NOTE — Assessment & Plan Note (Signed)
Encouraged him to quit 

## 2020-05-11 NOTE — Assessment & Plan Note (Signed)
Suspect his increased Cr is due to his increased lasix (80mg  bid).  Will continue to watch, appreciate PCP f/u.

## 2020-05-11 NOTE — Assessment & Plan Note (Addendum)
Poorly controlled Appreciate PCP f/u I encouraged him to get TBSE.  Suggested he get colon.

## 2020-05-11 NOTE — Assessment & Plan Note (Signed)
Well controlled  Needs better control of edema.

## 2020-06-02 ENCOUNTER — Emergency Department (HOSPITAL_COMMUNITY)
Admission: EM | Admit: 2020-06-02 | Discharge: 2020-06-03 | Disposition: A | Discharge status: Home / Self Care | Payer: Self-pay | Attending: Emergency Medicine | Admitting: Emergency Medicine

## 2020-06-02 ENCOUNTER — Other Ambulatory Visit: Payer: Self-pay

## 2020-06-02 ENCOUNTER — Encounter (HOSPITAL_COMMUNITY): Payer: Self-pay

## 2020-06-02 ENCOUNTER — Emergency Department (HOSPITAL_COMMUNITY): Payer: Self-pay

## 2020-06-02 DIAGNOSIS — E860 Dehydration: Secondary | ICD-10-CM | POA: Insufficient documentation

## 2020-06-02 DIAGNOSIS — Z794 Long term (current) use of insulin: Secondary | ICD-10-CM | POA: Insufficient documentation

## 2020-06-02 DIAGNOSIS — B20 Human immunodeficiency virus [HIV] disease: Secondary | ICD-10-CM | POA: Insufficient documentation

## 2020-06-02 DIAGNOSIS — N183 Chronic kidney disease, stage 3 unspecified: Secondary | ICD-10-CM | POA: Insufficient documentation

## 2020-06-02 DIAGNOSIS — I951 Orthostatic hypotension: Secondary | ICD-10-CM | POA: Insufficient documentation

## 2020-06-02 DIAGNOSIS — I129 Hypertensive chronic kidney disease with stage 1 through stage 4 chronic kidney disease, or unspecified chronic kidney disease: Secondary | ICD-10-CM | POA: Insufficient documentation

## 2020-06-02 DIAGNOSIS — Z7982 Long term (current) use of aspirin: Secondary | ICD-10-CM | POA: Insufficient documentation

## 2020-06-02 DIAGNOSIS — F17210 Nicotine dependence, cigarettes, uncomplicated: Secondary | ICD-10-CM | POA: Insufficient documentation

## 2020-06-02 DIAGNOSIS — E1122 Type 2 diabetes mellitus with diabetic chronic kidney disease: Secondary | ICD-10-CM | POA: Insufficient documentation

## 2020-06-02 DIAGNOSIS — Z20822 Contact with and (suspected) exposure to covid-19: Secondary | ICD-10-CM | POA: Insufficient documentation

## 2020-06-02 DIAGNOSIS — Z79899 Other long term (current) drug therapy: Secondary | ICD-10-CM | POA: Insufficient documentation

## 2020-06-02 LAB — URINALYSIS, ROUTINE W REFLEX MICROSCOPIC
Bacteria, UA: NONE SEEN
Bilirubin Urine: NEGATIVE
Glucose, UA: >=500 mg/dL — AB
Hgb urine dipstick: NEGATIVE
Ketones, ur: NEGATIVE mg/dL
Leukocytes,Ua: NEGATIVE
Nitrite: NEGATIVE
Protein, ur: 100 mg/dL — AB
Specific Gravity, Urine: 1.016 (ref 1.005–1.030)
pH: 6 (ref 5.0–8.0)

## 2020-06-02 LAB — CBC
HCT: 36.1 % — ABNORMAL LOW (ref 39.0–52.0)
Hemoglobin: 12.3 g/dL — ABNORMAL LOW (ref 13.0–17.0)
MCH: 35.4 pg — ABNORMAL HIGH (ref 26.0–34.0)
MCHC: 34.1 g/dL (ref 30.0–36.0)
MCV: 104 fL — ABNORMAL HIGH (ref 80.0–100.0)
Platelets: 199 10*3/uL (ref 150–400)
RBC: 3.47 MIL/uL — ABNORMAL LOW (ref 4.22–5.81)
RDW: 12.8 % (ref 11.5–15.5)
WBC: 6.5 10*3/uL (ref 4.0–10.5)
nRBC: 0 % (ref 0.0–0.2)

## 2020-06-02 LAB — HEPATIC FUNCTION PANEL
ALT: 67 U/L — ABNORMAL HIGH (ref 0–44)
AST: 33 U/L (ref 15–41)
Albumin: 3.7 g/dL (ref 3.5–5.0)
Alkaline Phosphatase: 59 U/L (ref 38–126)
Bilirubin, Direct: <0.1 mg/dL (ref 0.0–0.2)
Total Bilirubin: 0.5 mg/dL (ref 0.3–1.2)
Total Protein: 7 g/dL (ref 6.5–8.1)

## 2020-06-02 LAB — RAPID URINE DRUG SCREEN, HOSP PERFORMED
Amphetamines: NOT DETECTED
Barbiturates: NOT DETECTED
Benzodiazepines: NOT DETECTED
Cocaine: NOT DETECTED
Opiates: NOT DETECTED
Tetrahydrocannabinol: NOT DETECTED

## 2020-06-02 LAB — BASIC METABOLIC PANEL
Anion gap: 16 — ABNORMAL HIGH (ref 5–15)
BUN: 51 mg/dL — ABNORMAL HIGH (ref 6–20)
CO2: 22 mmol/L (ref 22–32)
Calcium: 9.4 mg/dL (ref 8.9–10.3)
Chloride: 100 mmol/L (ref 98–111)
Creatinine, Ser: 2.91 mg/dL — ABNORMAL HIGH (ref 0.61–1.24)
GFR calc Af Amer: 28 mL/min — ABNORMAL LOW (ref >60)
GFR calc non Af Amer: 24 mL/min — ABNORMAL LOW (ref >60)
Glucose, Bld: 227 mg/dL — ABNORMAL HIGH (ref 70–99)
Potassium: 4.2 mmol/L (ref 3.5–5.1)
Sodium: 138 mmol/L (ref 135–145)

## 2020-06-02 LAB — PROTIME-INR
INR: 1 (ref 0.8–1.2)
Prothrombin Time: 12.7 seconds (ref 11.4–15.2)

## 2020-06-02 LAB — LACTIC ACID, PLASMA: Lactic Acid, Venous: 2.7 mmol/L (ref 0.5–1.9)

## 2020-06-02 LAB — D-DIMER, QUANTITATIVE: D-Dimer, Quant: 0.65 ug/mL-FEU — ABNORMAL HIGH (ref 0.00–0.50)

## 2020-06-02 LAB — LIPASE, BLOOD: Lipase: 41 U/L (ref 11–51)

## 2020-06-02 LAB — CBG MONITORING, ED: Glucose-Capillary: 219 mg/dL — ABNORMAL HIGH (ref 70–99)

## 2020-06-02 LAB — TROPONIN I (HIGH SENSITIVITY): Troponin I (High Sensitivity): 10 ng/L (ref <18)

## 2020-06-02 LAB — CK: Total CK: 234 U/L (ref 49–397)

## 2020-06-02 MED ORDER — SODIUM CHLORIDE 0.9 % IV BOLUS
1000.0000 mL | Freq: Once | INTRAVENOUS | Status: AC
  Administered 2020-06-02: 1000 mL via INTRAVENOUS

## 2020-06-02 MED ORDER — SODIUM CHLORIDE 0.9 % IV BOLUS (SEPSIS)
1000.0000 mL | Freq: Once | INTRAVENOUS | Status: AC
  Administered 2020-06-02: 1000 mL via INTRAVENOUS

## 2020-06-02 MED ORDER — SODIUM CHLORIDE 0.9 % IV SOLN: 1000.0000 mL | INTRAVENOUS | Status: DC

## 2020-06-02 NOTE — ED Triage Notes (Signed)
Pt BIB EMS for hypotension & weakness. Pt was outside working on [facility] unit, got overheated & felt weak. EMS got initial pressure of 76 systolic palpated, administered NS & pressure rose to 99 systolic. Pt claims he's feeling "a lot better than before" after . All other VS WDL for EMS

## 2020-06-02 NOTE — Discharge Instructions (Signed)
1.  Call your doctor for recheck within the next 2 days. 2.  Return to the emergency department if you develop chest pain, shortness of breath, lightheadedness or other concerning symptoms.

## 2020-06-02 NOTE — ED Notes (Signed)
Stands for OrthoVs no assistance. Steady on feet. No complaints.

## 2020-06-02 NOTE — ED Provider Notes (Signed)
11:46 PM Assumed care from Dr. Donnald Garre, please see their note for full history, physical and decision making until this point. In brief this is a 49 y.o. year old male who presented to the ED tonight with Hypotension, Dehydration, and Weakness   Here with apparent hypovolemia. No other associated symptoms. Plan for repeat troponin to ensure no cardiac involvement.  On my exam, patient feels much better.  Labs with elevated d dimer as well but patient with normal VS now after fluid resuscitation and no symptoms consistent with PE, will defer PE scan with renal insufficiency at this time unless he becomes symptomatic for same.   After fluid boluses patient's blood pressures continue to be stable.  Lactic acid improved, making urine.  Patient feels asymptomatic this time no chest pain or shortness of breath.  Will follow up with PCP for further check of his kidney function in about a week.  Will stay hydrated during that time.  Return here for any worsening symptoms.  Discharge instructions, including strict return precautions for new or worsening symptoms, given. Patient and/or family verbalized understanding and agreement with the plan as described.   Labs, studies and imaging reviewed by myself and considered in medical decision making if ordered. Imaging interpreted by radiology.  Labs Reviewed  BASIC METABOLIC PANEL - Abnormal; Notable for the following components:      Result Value   Glucose, Bld 227 (*)    BUN 51 (*)    Creatinine, Ser 2.91 (*)    GFR calc non Af Amer 24 (*)    GFR calc Af Amer 28 (*)    Anion gap 16 (*)    All other components within normal limits  CBC - Abnormal; Notable for the following components:   RBC 3.47 (*)    Hemoglobin 12.3 (*)    HCT 36.1 (*)    MCV 104.0 (*)    MCH 35.4 (*)    All other components within normal limits  URINALYSIS, ROUTINE W REFLEX MICROSCOPIC - Abnormal; Notable for the following components:   APPearance HAZY (*)    Glucose, UA  >=500 (*)    Protein, ur 100 (*)    All other components within normal limits  HEPATIC FUNCTION PANEL - Abnormal; Notable for the following components:   ALT 67 (*)    All other components within normal limits  LACTIC ACID, PLASMA - Abnormal; Notable for the following components:   Lactic Acid, Venous 2.7 (*)    All other components within normal limits  D-DIMER, QUANTITATIVE (NOT AT [facility]) - Abnormal; Notable for the following components:   D-Dimer, Quant 0.65 (*)    All other components within normal limits  CBG MONITORING, ED - Abnormal; Notable for the following components:   Glucose-Capillary 219 (*)    All other components within normal limits  SARS CORONAVIRUS 2 BY RT PCR (HOSPITAL ORDER, PERFORMED IN [facility])  CK  LIPASE, BLOOD  PROTIME-INR  RAPID URINE DRUG SCREEN, HOSP PERFORMED  LACTIC ACID, PLASMA  TROPONIN I (HIGH SENSITIVITY)  TROPONIN I (HIGH SENSITIVITY)    DG Chest Port 1 View  Final Result      No follow-ups on file.    Krystian Younglove, Barbara Cower, MD 06/03/20 [phone]

## 2020-06-02 NOTE — ED Provider Notes (Signed)
[facility] EMERGENCY DEPARTMENT Provider Note   CSN: [id number] Arrival date & time: 06/02/20  1918     History Chief Complaint  Patient presents with  . Hypotension  . Dehydration  . Weakness    Harold Crosby is a 49 y.o. male.  HPI Patient reports that the air conditioning had been out in his home when he went outside to check on the unit.  His wife reports is not that hot in the house despite the [facility] unit not working.  She reports its about 80 degrees.  He had only been outside for about 5 minutes and he suddenly started to feel extremely weak and generally bad.  He went back into the house to talk to his wife.  She reports he did not look good.  He walked over to a nearby air-conditioned building.  She reports it was very cool and there but he continued to feel worse and worse.  He reports that he was feeling short of breath and nauseated.  He then became extremely sweaty.  His wife reports that he was very pale in appearance and lost all of his color.  At that time, they determined he needed to come to the emergency department and EMS was called.  She reports she took his blood pressure at home and it was in the 70s systolic.  When EMS got there they also got a pressure systolic 70s.  They administered a liter of fluids and systolic pressure rose to 99.  At this time patient reports he feels improved.  He denies any chest pain.  Ports he still feels somewhat fatigued but does not feel like he is going to pass out.  No chest pain or headache.  Patient has not been experiencing any nausea vomiting and diarrhea.  He was feeling well before this episode.  Patient has not had Covid immunization.  Patient is prescribed to take 80 mg of Lasix twice a day.  He reports he actually cuts that down and only takes 40 mg twice a day.  Patient history of well-controlled HIV.    Past Medical History:  Diagnosis Date  . History of thrush   . HIV (human immunodeficiency virus infection)  (HCC)    Followed by Dr. [name] in ID clinic.   Marland Kitchen Hyperlipidemia   . Hypertension   . Insomnia   . Tobacco abuse   . Type II diabetes mellitus [facility])     Patient Active Problem List   Diagnosis Date Noted  . CKD stage 3 secondary to diabetes (HCC) 09/29/2016  . Leg pain, bilateral 07/13/2016  . Bilateral leg edema 07/13/2016  . Nocturia more than twice per night 08/12/2014  . TOBACCO USER 06/02/2009  . Moderate nonproliferative diabetic retinopathy (HCC) 04/21/2009  . Hyperlipidemia 01/05/2009  . Essential hypertension 07/08/2007  . FURUNCLE 05/31/2007  . INSOMNIA 05/31/2007  . Human immunodeficiency virus disease (HCC) 05/31/2007  . Diabetes mellitus type 2, uncontrolled, with complications (HCC) 05/17/2007    History reviewed. No pertinent surgical history.     Family History  Problem Relation Age of Onset  . Coronary artery disease Other   . Diabetes Other   . Kidney failure Mother     Social History   Tobacco Use  . Smoking status: Current Every Day Smoker    Packs/day: 1.00    Years: 20.00    Pack years: 20.00    Types: Cigarettes  . Smokeless tobacco: Never Used  . Tobacco comment: trying to  quit  Substance Use Topics  . Alcohol use: No  . Drug use: No    Home Medications Prior to Admission medications   Medication Sig Start Date End Date Taking? Authorizing Provider  aspirin 81 MG tablet Take 81 mg by mouth daily.     Yes [provider]  emtricitabine-rilpivir-tenofovir AF (ODEFSEY) 200-25-25 MG TABS tablet TAKE 1 TABLET BY MOUTH EVERY DAY WITH BREAKFAST 04/20/20  Yes Ginnie Smart, MD  finasteride (PROSCAR) 5 MG tablet Take 5 mg by mouth daily.   Yes [provider]  furosemide (LASIX) 40 MG tablet Take 40 mg by mouth 2 (two) times daily.    Yes [provider]  gabapentin (NEURONTIN) 300 MG capsule Take 2 capsules (600 mg total) by mouth 3 (three) times daily. 08/31/16  Yes Janeann Forehand., MD  insulin NPH Human  (HUMULIN N,NOVOLIN N) 100 UNIT/ML injection Inject 95 Units into the skin 2 (two) times daily before a meal.    Yes [provider]  insulin regular (NOVOLIN R,HUMULIN R) 100 units/mL injection Inject 95 Units into the skin 2 (two) times daily before a meal.    Yes [provider]  lisinopril-hydrochlorothiazide (ZESTORETIC) 20-25 MG tablet TAKE 1 TABLET BY MOUTH EVERY DAY 02/25/20  Yes Ginnie Smart, MD  Meloxicam 10 MG CAPS Take 1 capsule by mouth daily.    Yes [provider]  metFORMIN (GLUCOPHAGE) 1000 MG tablet Take 1 tablet (1,000 mg total) by mouth 2 (two) times daily with a meal. 07/19/12  Yes Margarito Liner, MD  valACYclovir (VALTREX) 1000 MG tablet Take 1,000 mg by mouth 3 (three) times daily.   Yes [provider]  insulin glargine (LANTUS) 100 UNIT/ML injection Inject 0.45 mLs (45 Units total) into the skin 2 (two) times daily. Patient not taking: Reported on 06/02/2020 11/24/15   Servando Snare, MD    Allergies    Sulfonamide derivatives  Review of Systems   Review of Systems  Physical Exam Updated Vital Signs BP (!) 98/57   Pulse 77   Temp 97.6 F (36.4 C) (Oral)   Resp 20   Ht 6\' 1"  (1.854 m)   Wt 113.4 kg   SpO2 97%   BMI 32.98 kg/m   Physical Exam Constitutional:      Appearance: He is well-developed.  HENT:     Head: Normocephalic and atraumatic.  Eyes:     Pupils: Pupils are equal, round, and reactive to light.  Cardiovascular:     Rate and Rhythm: Normal rate and regular rhythm.     Heart sounds: Normal heart sounds.  Pulmonary:     Effort: Pulmonary effort is normal.     Breath sounds: Normal breath sounds.  Abdominal:     General: Bowel sounds are normal. There is no distension.     Palpations: Abdomen is soft.     Tenderness: There is no abdominal tenderness.  Musculoskeletal:        General: Normal range of motion.     Cervical back: Neck supple.  Skin:    General: Skin is warm and dry.  Neurological:      General: No focal deficit present.     Mental Status: He is alert and oriented to person, place, and time.     GCS: GCS eye subscore is 4. GCS verbal subscore is 5. GCS motor subscore is 6.     Motor: No weakness.     Coordination: Coordination normal.  Psychiatric:  Mood and Affect: Mood normal.     ED Results / Procedures / Treatments   Labs (all labs ordered are listed, but only abnormal results are displayed) Labs Reviewed  BASIC METABOLIC PANEL - Abnormal; Notable for the following components:      Result Value   Glucose, Bld 227 (*)    BUN 51 (*)    Creatinine, Ser 2.91 (*)    GFR calc non Af Amer 24 (*)    GFR calc Af Amer 28 (*)    Anion gap 16 (*)    All other components within normal limits  CBC - Abnormal; Notable for the following components:   RBC 3.47 (*)    Hemoglobin 12.3 (*)    HCT 36.1 (*)    MCV 104.0 (*)    MCH 35.4 (*)    All other components within normal limits  URINALYSIS, ROUTINE W REFLEX MICROSCOPIC - Abnormal; Notable for the following components:   APPearance HAZY (*)    Glucose, UA >=500 (*)    Protein, ur 100 (*)    All other components within normal limits  HEPATIC FUNCTION PANEL - Abnormal; Notable for the following components:   ALT 67 (*)    All other components within normal limits  LACTIC ACID, PLASMA - Abnormal; Notable for the following components:   Lactic Acid, Venous 2.7 (*)    All other components within normal limits  D-DIMER, QUANTITATIVE (NOT AT [facility]) - Abnormal; Notable for the following components:   D-Dimer, Quant 0.65 (*)    All other components within normal limits  CBG MONITORING, ED - Abnormal; Notable for the following components:   Glucose-Capillary 219 (*)    All other components within normal limits  SARS CORONAVIRUS 2 BY RT PCR (HOSPITAL ORDER, PERFORMED IN [place] [facility])  CK  LIPASE, BLOOD  PROTIME-INR  RAPID URINE DRUG SCREEN, HOSP PERFORMED  LACTIC ACID, PLASMA  TROPONIN I (HIGH  SENSITIVITY)  TROPONIN I (HIGH SENSITIVITY)    EKG EKG Interpretation  Date/Time:  Wednesday June 02 2020 19:32:12 EDT Ventricular Rate:  81 PR Interval:    QRS Duration: 91 QT Interval:  421 QTC Calculation: 492 R Axis:   -5 Text Interpretation: Sinus rhythm Low voltage, precordial leads Abnormal R-wave progression, early transition Borderline T wave abnormalities Borderline prolonged QT interval Baseline wander in lead(s) V6 no acute ischemic appearance, no sig change from old Confirmed by Arby Barrette [phone]) on 06/02/2020 8:29:14 PM   Radiology DG Chest Port 1 View  Result Date: 06/02/2020 CLINICAL DATA:  49 year old male with syncope and weakness. EXAM: PORTABLE CHEST 1 VIEW COMPARISON:  Chest radiographs 04/27/2007. FINDINGS: Portable AP upright view at 2102 hours. Mediastinal contours remain within normal limits. Visualized tracheal air column is within normal limits. Allowing for portable technique the lungs are clear. No pneumothorax. No acute osseous abnormality identified. IMPRESSION: Negative portable chest. Electronically Signed   By: Odessa Fleming M.D.   On: 06/02/2020 21:18    Procedures Procedures (including critical care time)  Medications Ordered in ED Medications  sodium chloride 0.9 % bolus 1,000 mL (0 mLs Intravenous Stopped 06/02/20 2312)    ED Course  I have reviewed the triage vital signs and the nursing notes.  Pertinent labs & imaging results that were available during my care of the patient were reviewed by me and considered in my medical decision making (see chart for details).    MDM Rules/Calculators/A&P  Had episode of becoming very weak with near syncope.  He was also diaphoretic and nauseated.  Patient has mild AKI suggestive of dehydration.  He does take Lasix twice daily.  No associated chest pain or headache.  He began improving after hydration by EMS.  Patient's blood pressures are improving.  I have continued hydration in the  emergency department.  The static blood pressures are now within normal range.  Patient has mild lactic acidosis and AKI.  Will plan to recheck lactate and second troponin as well as repeat BUN/creatinine.  Anticipate if these are normalizing and patient is asymptomatic and stable with position change and ambulation likely discharge.  Dr. Dayna Barker to reassess and make final disposition. Final Clinical Impression(s) / ED Diagnoses Final diagnoses:  Dehydration  Orthostatic hypotension    Rx / DC Orders ED Discharge Orders    None       Charlesetta Shanks, MD 06/02/20 2325

## 2020-06-03 LAB — TROPONIN I (HIGH SENSITIVITY): Troponin I (High Sensitivity): 7 ng/L (ref <18)

## 2020-06-03 LAB — SARS CORONAVIRUS 2 BY RT PCR (HOSPITAL ORDER, PERFORMED IN ~~LOC~~ HOSPITAL LAB): SARS Coronavirus 2: NEGATIVE

## 2020-06-03 LAB — LACTIC ACID, PLASMA: Lactic Acid, Venous: 2.5 mmol/L (ref 0.5–1.9)

## 2020-06-16 ENCOUNTER — Other Ambulatory Visit: Payer: Self-pay | Admitting: Infectious Diseases

## 2020-06-16 DIAGNOSIS — B20 Human immunodeficiency virus [HIV] disease: Secondary | ICD-10-CM

## 2020-07-15 ENCOUNTER — Encounter: Payer: Self-pay | Admitting: Infectious Diseases

## 2020-09-21 ENCOUNTER — Encounter: Payer: Self-pay | Admitting: Infectious Diseases

## 2020-09-21 ENCOUNTER — Other Ambulatory Visit: Payer: Self-pay

## 2020-09-21 ENCOUNTER — Ambulatory Visit: Payer: Self-pay

## 2020-10-05 DIAGNOSIS — R109 Unspecified abdominal pain: Secondary | ICD-10-CM | POA: Insufficient documentation

## 2020-11-11 ENCOUNTER — Encounter: Payer: Self-pay | Admitting: Infectious Diseases

## 2020-11-11 ENCOUNTER — Ambulatory Visit (INDEPENDENT_AMBULATORY_CARE_PROVIDER_SITE_OTHER): Payer: Self-pay | Admitting: Infectious Diseases

## 2020-11-11 ENCOUNTER — Other Ambulatory Visit: Payer: Self-pay

## 2020-11-11 VITALS — BP 148/81 | HR 91 | Temp 98.4°F | Wt 254.0 lb

## 2020-11-11 DIAGNOSIS — I10 Essential (primary) hypertension: Secondary | ICD-10-CM

## 2020-11-11 DIAGNOSIS — E1122 Type 2 diabetes mellitus with diabetic chronic kidney disease: Secondary | ICD-10-CM

## 2020-11-11 DIAGNOSIS — N183 Chronic kidney disease, stage 3 unspecified: Secondary | ICD-10-CM

## 2020-11-11 DIAGNOSIS — Z113 Encounter for screening for infections with a predominantly sexual mode of transmission: Secondary | ICD-10-CM

## 2020-11-11 DIAGNOSIS — Z79899 Other long term (current) drug therapy: Secondary | ICD-10-CM

## 2020-11-11 DIAGNOSIS — E1165 Type 2 diabetes mellitus with hyperglycemia: Secondary | ICD-10-CM

## 2020-11-11 DIAGNOSIS — E118 Type 2 diabetes mellitus with unspecified complications: Secondary | ICD-10-CM

## 2020-11-11 DIAGNOSIS — B20 Human immunodeficiency virus [HIV] disease: Secondary | ICD-10-CM

## 2020-11-11 DIAGNOSIS — IMO0002 Reserved for concepts with insufficient information to code with codable children: Secondary | ICD-10-CM

## 2020-11-11 NOTE — Assessment & Plan Note (Addendum)
Will check his A1C  Appreciate his PCP f/u.  Primary to schedule ophtho

## 2020-11-11 NOTE — Assessment & Plan Note (Signed)
Will continue odefsy.  Will refer for colon He does not want COVID vax but does consent to interview.  Offered/refused condoms.  Will check his labs today.  rtc in 6 months.

## 2020-11-11 NOTE — Progress Notes (Signed)
   Subjective:    Patient ID: Harold Crosby, male    DOB: 1971/03/29, 49 y.o.   MRN: [id number]  HPI 49yo M with HIV+, adm to hospital November 2016 with peri-anal abscess/sepsis and uncontrolled DM2.  He has been on odefsy. He also has a hx of CKD3 (last Cr 2.91 05-2020). he was seen 11-10-20 at [facility].  Smoking 1/4 to 1/2ppd. Last A1C was 11.6% 10-2019 He had cervical fusion 12-2018 then lumbar fusion 10-2019 [facility]). He continues to be eval at [facility] for possible repeat spinal surgery.   He is being eval by CV - he exerts himself, has DOE, BP drops and then near-syncope.   HIV 1 RNA Quant (copies/mL)  Date Value  04/22/2020 <20 DETECTED (A)  06/02/2019 <20 NOT DETECTED  08/07/2018 <20 NOT DETECTED   CD4 T Cell Abs (/uL)  Date Value  04/22/2020 483  06/02/2019 428  08/07/2018 480    Review of Systems  Constitutional: Negative for appetite change, chills, fever and unexpected weight change.  Respiratory: Positive for cough and shortness of breath.   Cardiovascular: Negative for chest pain.  Gastrointestinal: Negative for constipation and diarrhea.  Genitourinary: Negative for difficulty urinating.  Neurological: Positive for numbness.  Please see HPI. All other systems reviewed and negative. No ophtho this year.  Has not had colon.   Health Maintenance  Topic Date Due  . COVID-19 Vaccine (1) Never done  . HEMOGLOBIN A1C  10/13/2016  . FOOT EXAM  05/25/2017  . OPHTHALMOLOGY EXAM  07/13/2017  . COLONOSCOPY  Never done  . LIPID PANEL  04/22/2021  . TETANUS/TDAP  09/19/2029  . INFLUENZA VACCINE  Completed  . PNEUMOCOCCAL POLYSACCHARIDE VACCINE AGE 18-64 HIGH RISK  Completed  . Hepatitis C Screening  Completed  . HIV Screening  Completed        Objective:   Physical Exam Vitals reviewed.  Constitutional:      Appearance: Normal appearance.  HENT:     Mouth/Throat:     Mouth: Mucous membranes are moist.     Pharynx: No oropharyngeal exudate.  Eyes:      Extraocular Movements: Extraocular movements intact.     Pupils: Pupils are equal, round, and reactive to light.  Cardiovascular:     Rate and Rhythm: Normal rate and regular rhythm.  Pulmonary:     Effort: Pulmonary effort is normal.     Breath sounds: Normal breath sounds.  Abdominal:     General: Bowel sounds are normal. There is no distension.     Palpations: Abdomen is soft.     Tenderness: There is no abdominal tenderness.  Musculoskeletal:        General: Normal range of motion.     Cervical back: Normal range of motion and neck supple.     Right lower leg: Edema present.     Left lower leg: Edema present.  Neurological:     Mental Status: He is alert.  Psychiatric:        Mood and Affect: Mood normal.           Assessment & Plan:

## 2020-11-11 NOTE — Assessment & Plan Note (Signed)
Appreciate renal f/u.  Has progressed since last visit.

## 2020-11-11 NOTE — Assessment & Plan Note (Signed)
Mildly elevated today Appreciate Renal eval.

## 2020-11-12 LAB — T-HELPER CELL (CD4) - (RCID CLINIC ONLY)
CD4 % Helper T Cell: 45 % (ref 33–65)
CD4 T Cell Abs: 531 /uL (ref 400–1790)

## 2020-11-13 LAB — LIPID PANEL
Cholesterol: 264 mg/dL — ABNORMAL HIGH (ref <200)
HDL: 36 mg/dL — ABNORMAL LOW (ref >=40)
Non-HDL Cholesterol (Calc): 228 mg/dL (calc) — ABNORMAL HIGH (ref <130)
Total CHOL/HDL Ratio: 7.3 (calc) — ABNORMAL HIGH (ref <5.0)
Triglycerides: 586 mg/dL — ABNORMAL HIGH (ref <150)

## 2020-11-13 LAB — CBC
HCT: 39.9 % (ref 38.5–50.0)
Hemoglobin: 14.1 g/dL (ref 13.2–17.1)
MCH: 34.5 pg — ABNORMAL HIGH (ref 27.0–33.0)
MCHC: 35.3 g/dL (ref 32.0–36.0)
MCV: 97.6 fL (ref 80.0–100.0)
MPV: 9.5 fL (ref 7.5–12.5)
Platelets: 289 10*3/uL (ref 140–400)
RBC: 4.09 10*6/uL — ABNORMAL LOW (ref 4.20–5.80)
RDW: 13.2 % (ref 11.0–15.0)
WBC: 6.3 10*3/uL (ref 3.8–10.8)

## 2020-11-13 LAB — COMPREHENSIVE METABOLIC PANEL
AG Ratio: 1.6 (calc) (ref 1.0–2.5)
ALT: 54 U/L — ABNORMAL HIGH (ref 9–46)
AST: 41 U/L — ABNORMAL HIGH (ref 10–35)
Albumin: 4.1 g/dL (ref 3.6–5.1)
Alkaline phosphatase (APISO): 79 U/L (ref 35–144)
BUN: 23 mg/dL (ref 7–25)
CO2: 27 mmol/L (ref 20–32)
Calcium: 9.8 mg/dL (ref 8.6–10.3)
Chloride: 103 mmol/L (ref 98–110)
Creat: 1.26 mg/dL (ref 0.70–1.33)
Globulin: 2.6 g/dL (calc) (ref 1.9–3.7)
Glucose, Bld: 117 mg/dL — ABNORMAL HIGH (ref 65–99)
Potassium: 4.4 mmol/L (ref 3.5–5.3)
Sodium: 141 mmol/L (ref 135–146)
Total Bilirubin: 0.2 mg/dL (ref 0.2–1.2)
Total Protein: 6.7 g/dL (ref 6.1–8.1)

## 2020-11-13 LAB — RPR: RPR Ser Ql: NONREACTIVE

## 2020-11-13 LAB — HEMOGLOBIN A1C
Hgb A1c MFr Bld: 11.4 % of total Hgb — ABNORMAL HIGH (ref <5.7)
Mean Plasma Glucose: 280 (calc)
eAG (mmol/L): 15.5 (calc)

## 2020-11-13 LAB — HIV-1 RNA QUANT-NO REFLEX-BLD
HIV 1 RNA Quant: <20 Copies/mL — ABNORMAL HIGH
HIV-1 RNA Quant, Log: <1.3 Log cps/mL — ABNORMAL HIGH

## 2020-12-28 ENCOUNTER — Other Ambulatory Visit: Payer: Self-pay | Admitting: Infectious Diseases

## 2020-12-28 DIAGNOSIS — B20 Human immunodeficiency virus [HIV] disease: Secondary | ICD-10-CM

## 2021-01-12 DIAGNOSIS — I2510 Atherosclerotic heart disease of native coronary artery without angina pectoris: Secondary | ICD-10-CM | POA: Insufficient documentation

## 2021-03-04 ENCOUNTER — Encounter: Payer: Self-pay | Admitting: Infectious Diseases

## 2021-05-12 ENCOUNTER — Encounter: Payer: Self-pay | Admitting: Infectious Diseases

## 2021-05-12 ENCOUNTER — Ambulatory Visit (INDEPENDENT_AMBULATORY_CARE_PROVIDER_SITE_OTHER): Payer: Self-pay | Admitting: Infectious Diseases

## 2021-05-12 ENCOUNTER — Other Ambulatory Visit: Payer: Self-pay

## 2021-05-12 VITALS — BP 138/84 | HR 90 | Temp 98.3°F | Ht 72.0 in | Wt 260.0 lb

## 2021-05-12 DIAGNOSIS — IMO0002 Reserved for concepts with insufficient information to code with codable children: Secondary | ICD-10-CM

## 2021-05-12 DIAGNOSIS — Z79899 Other long term (current) drug therapy: Secondary | ICD-10-CM

## 2021-05-12 DIAGNOSIS — Z113 Encounter for screening for infections with a predominantly sexual mode of transmission: Secondary | ICD-10-CM

## 2021-05-12 DIAGNOSIS — E1165 Type 2 diabetes mellitus with hyperglycemia: Secondary | ICD-10-CM

## 2021-05-12 DIAGNOSIS — E118 Type 2 diabetes mellitus with unspecified complications: Secondary | ICD-10-CM

## 2021-05-12 DIAGNOSIS — R600 Localized edema: Secondary | ICD-10-CM

## 2021-05-12 DIAGNOSIS — I10 Essential (primary) hypertension: Secondary | ICD-10-CM

## 2021-05-12 DIAGNOSIS — F17200 Nicotine dependence, unspecified, uncomplicated: Secondary | ICD-10-CM

## 2021-05-12 DIAGNOSIS — B20 Human immunodeficiency virus [HIV] disease: Secondary | ICD-10-CM

## 2021-05-12 NOTE — Assessment & Plan Note (Signed)
On diuretics, encouraged to keep legs up.

## 2021-05-12 NOTE — Assessment & Plan Note (Signed)
Encourage to quit 

## 2021-05-12 NOTE — Assessment & Plan Note (Signed)
He needs better control Appreciate his PCP f/u.  Has neuropathy. On neurontin.

## 2021-05-12 NOTE — Assessment & Plan Note (Signed)
Offered/refused condoms.  He consents to [employer], and completes Will give him COVID vax today Will check his labs today Overall doing ok, CV and DM concerns predominate.  rtc in 6 months with labs.

## 2021-05-12 NOTE — Progress Notes (Signed)
   Subjective:    Patient ID: Harold Crosby, male  DOB: 1971-06-20, 50 y.o.        MRN: [id number]   HPI 50yo M with HIV+, adm to hospital November 2016 with peri-anal abscess/sepsis and uncontrolled DM2.  He has been on odefsy. Has been taking this well- no missed or forgotten doses.  He also has a hx of CKD3 (last Cr 1.26 10-2020). Follows at [facility].  Smoking 1/4 to 1/2 ppd. Last A1C was11.4%10-2020. "Could be a lot better". Aware of need for healthy diet.  He had cervical fusion 12-2018 then lumbar fusion 10-2019 [facility]). He continues to be eval at [facility] for possible repeat spinal surgery.   He is being eval by CV - had normal nuclear perfusion study 12-2020 at [facility]. "I was passing out...they didn't know, they didn't say".  HIV 1 RNA Quant  Date Value  11/11/2020 <20 Copies/mL (H)  04/22/2020 <20 DETECTED copies/mL (A)  06/02/2019 <20 NOT DETECTED copies/mL   CD4 T Cell Abs (/uL)  Date Value  11/11/2020 531  04/22/2020 483  06/02/2019 428     Health Maintenance  Topic Date Due  . COVID-19 Vaccine (1) Never done  . COLONOSCOPY (Pts 45-1yrs Insurance coverage will need to be confirmed)  Never done  . OPHTHALMOLOGY EXAM  04/24/2017  . FOOT EXAM  05/25/2017  . HEMOGLOBIN A1C  02/11/2021  . INFLUENZA VACCINE  07/25/2021  . LIPID PANEL  11/11/2021  . TETANUS/TDAP  09/19/2029  . PNEUMOCOCCAL POLYSACCHARIDE VACCINE AGE 84-64 HIGH RISK  Completed  . Hepatitis C Screening  Completed  . HIV Screening  Completed  . HPV VACCINES  Aged Out    Review of Systems  Constitutional: Negative for chills, fever and weight loss (had dramatic chage in wt recently +/- with fluid chnages. ).  HENT: Negative for congestion.   Respiratory: Negative for cough and shortness of breath.   Gastrointestinal: Positive for diarrhea. Negative for constipation.  Genitourinary: Negative for dysuria.  Neurological: Positive for sensory change.  Psychiatric/Behavioral: The patient does not  have insomnia.     Please see HPI. All other systems reviewed and negative.     Objective:  Physical Exam Vitals reviewed.  Constitutional:      Appearance: Normal appearance.  HENT:     Mouth/Throat:     Mouth: Mucous membranes are moist.     Pharynx: No oropharyngeal exudate.  Eyes:     Extraocular Movements: Extraocular movements intact.     Pupils: Pupils are equal, round, and reactive to light.  Cardiovascular:     Rate and Rhythm: Normal rate and regular rhythm.  Pulmonary:     Effort: Pulmonary effort is normal.     Breath sounds: Normal breath sounds.  Abdominal:     General: Bowel sounds are normal. There is no distension.     Palpations: Abdomen is soft.     Tenderness: There is no abdominal tenderness.  Musculoskeletal:     Cervical back: Normal range of motion and neck supple.     Right lower leg: Edema present.     Left lower leg: Edema present.  Skin:      Neurological:     General: No focal deficit present.     Mental Status: He is alert.  Psychiatric:        Mood and Affect: Mood normal.            Assessment & Plan:

## 2021-05-12 NOTE — Assessment & Plan Note (Signed)
He is fairly well controlled today.  He is on lasix, ACE-I, HCTZ

## 2021-05-13 LAB — T-HELPER CELL (CD4) - (RCID CLINIC ONLY)
CD4 % Helper T Cell: 46 % (ref 33–65)
CD4 T Cell Abs: 502 /uL (ref 400–1790)

## 2021-05-15 LAB — COMPREHENSIVE METABOLIC PANEL
AG Ratio: 1.2 (calc) (ref 1.0–2.5)
ALT: 35 U/L (ref 9–46)
AST: 28 U/L (ref 10–35)
Albumin: 3.6 g/dL (ref 3.6–5.1)
Alkaline phosphatase (APISO): 82 U/L (ref 35–144)
BUN: 16 mg/dL (ref 7–25)
CO2: 30 mmol/L (ref 20–32)
Calcium: 9.3 mg/dL (ref 8.6–10.3)
Chloride: 98 mmol/L (ref 98–110)
Creat: 1.2 mg/dL (ref 0.70–1.33)
Globulin: 3.1 g/dL (calc) (ref 1.9–3.7)
Glucose, Bld: 242 mg/dL — ABNORMAL HIGH (ref 65–99)
Potassium: 4 mmol/L (ref 3.5–5.3)
Sodium: 140 mmol/L (ref 135–146)
Total Bilirubin: 0.2 mg/dL (ref 0.2–1.2)
Total Protein: 6.7 g/dL (ref 6.1–8.1)

## 2021-05-15 LAB — CBC
HCT: 39.6 % (ref 38.5–50.0)
Hemoglobin: 13.2 g/dL (ref 13.2–17.1)
MCH: 33.2 pg — ABNORMAL HIGH (ref 27.0–33.0)
MCHC: 33.3 g/dL (ref 32.0–36.0)
MCV: 99.7 fL (ref 80.0–100.0)
MPV: 9.9 fL (ref 7.5–12.5)
Platelets: 236 10*3/uL (ref 140–400)
RBC: 3.97 10*6/uL — ABNORMAL LOW (ref 4.20–5.80)
RDW: 13.4 % (ref 11.0–15.0)
WBC: 5.1 10*3/uL (ref 3.8–10.8)

## 2021-05-15 LAB — HIV-1 RNA QUANT-NO REFLEX-BLD
HIV 1 RNA Quant: <20 Copies/mL — ABNORMAL HIGH
HIV-1 RNA Quant, Log: <1.3 Log cps/mL — ABNORMAL HIGH

## 2021-05-15 LAB — RPR: RPR Ser Ql: NONREACTIVE

## 2021-05-15 LAB — HEMOGLOBIN A1C: Hgb A1c MFr Bld: >14 % of total Hgb — ABNORMAL HIGH (ref <5.7)

## 2021-05-19 ENCOUNTER — Telehealth: Payer: Self-pay | Admitting: Infectious Diseases

## 2021-05-19 NOTE — Telephone Encounter (Signed)
Called and left VM with results of his labs.

## 2021-05-19 NOTE — Telephone Encounter (Signed)
-----   Message from Ginnie Smart, MD sent at 05/12/2021  2:43 PM EDT ----- Please call me with my labs CD4, HIV RNA

## 2021-06-22 ENCOUNTER — Other Ambulatory Visit: Payer: Self-pay | Admitting: Infectious Diseases

## 2021-06-22 DIAGNOSIS — B20 Human immunodeficiency virus [HIV] disease: Secondary | ICD-10-CM

## 2021-10-28 ENCOUNTER — Ambulatory Visit: Payer: Self-pay

## 2021-10-28 ENCOUNTER — Other Ambulatory Visit: Payer: Self-pay

## 2021-11-02 ENCOUNTER — Encounter: Payer: Self-pay | Admitting: Internal Medicine

## 2021-11-10 ENCOUNTER — Ambulatory Visit: Payer: Self-pay | Admitting: Infectious Diseases

## 2021-11-11 ENCOUNTER — Ambulatory Visit: Payer: Self-pay | Admitting: Infectious Diseases

## 2021-11-11 ENCOUNTER — Ambulatory Visit (INDEPENDENT_AMBULATORY_CARE_PROVIDER_SITE_OTHER): Payer: Self-pay | Admitting: Internal Medicine

## 2021-11-11 ENCOUNTER — Encounter: Payer: Self-pay | Admitting: Internal Medicine

## 2021-11-11 ENCOUNTER — Other Ambulatory Visit: Payer: Self-pay

## 2021-11-11 VITALS — BP 146/81 | HR 90 | Temp 97.8°F | Wt 231.0 lb

## 2021-11-11 DIAGNOSIS — Z23 Encounter for immunization: Secondary | ICD-10-CM

## 2021-11-11 DIAGNOSIS — Z794 Long term (current) use of insulin: Secondary | ICD-10-CM

## 2021-11-11 DIAGNOSIS — N183 Chronic kidney disease, stage 3 unspecified: Secondary | ICD-10-CM

## 2021-11-11 DIAGNOSIS — B20 Human immunodeficiency virus [HIV] disease: Secondary | ICD-10-CM

## 2021-11-11 DIAGNOSIS — E119 Type 2 diabetes mellitus without complications: Secondary | ICD-10-CM

## 2021-11-11 DIAGNOSIS — E1122 Type 2 diabetes mellitus with diabetic chronic kidney disease: Secondary | ICD-10-CM

## 2021-11-11 DIAGNOSIS — Z7185 Encounter for immunization safety counseling: Secondary | ICD-10-CM | POA: Insufficient documentation

## 2021-11-11 MED ORDER — ODEFSEY 200-25-25 MG PO TABS: ORAL_TABLET | ORAL | 5 refills | Status: DC

## 2021-11-11 NOTE — Assessment & Plan Note (Signed)
Poorly controlled with last A1c about 12 per his report.  Have encouraged follow up with PCP to control this better.

## 2021-11-11 NOTE — Assessment & Plan Note (Signed)
Flu shot done today.

## 2021-11-11 NOTE — Assessment & Plan Note (Signed)
He is doing well on Odefsey and reports adherence without any missed doses.  He follows with Dr [name].  Will check labs today and have him follow up again in about 6 months with Dr [name].  Refills sent on his Odefsey today.

## 2021-11-11 NOTE — Patient Instructions (Signed)
Thank you for coming to see me today. It was a pleasure seeing you.  To Do: Labs and flu shot today I refilled your Charlett Lango  If you have any questions or concerns, please do not hesitate to call the office at [phone].  Take Care,   Gwynn Burly

## 2021-11-11 NOTE — Progress Notes (Signed)
[facility]   CHIEF COMPLAINT    HIV follow up.    SUBJECTIVE:    Harold Crosby is a 50 y.o. male with PMHx as below who presents to the clinic for HIV follow up.   Please see A&P for the details of today's visit and status of the patient's medical problems.   Patient's Medications  New Prescriptions   No medications on file  Previous Medications   ASPIRIN 81 MG TABLET    Take 81 mg by mouth daily.   CELECOXIB (CELEBREX) 100 MG CAPSULE    Take 100 mg by mouth 2 (two) times daily as needed.   FINASTERIDE (PROSCAR) 5 MG TABLET    Take 5 mg by mouth daily.   FUROSEMIDE (LASIX) 40 MG TABLET    Take 40 mg by mouth 2 (two) times daily.    GABAPENTIN (NEURONTIN) 300 MG CAPSULE    Take 2 capsules (600 mg total) by mouth 3 (three) times daily.   INSULIN GLARGINE (LANTUS) 100 UNIT/ML INJECTION    Inject 0.45 mLs (45 Units total) into the skin 2 (two) times daily.   INSULIN NPH HUMAN (HUMULIN N,NOVOLIN N) 100 UNIT/ML INJECTION    Inject 95 Units into the skin 2 (two) times daily before a meal.    INSULIN REGULAR (NOVOLIN R,HUMULIN R) 100 UNITS/ML INJECTION    Inject 95 Units into the skin 2 (two) times daily before a meal.    JARDIANCE 10 MG TABS TABLET    Take 10 mg by mouth daily.   LISINOPRIL-HYDROCHLOROTHIAZIDE (ZESTORETIC) 20-25 MG TABLET    TAKE 1 TABLET BY MOUTH EVERY DAY   MELOXICAM 10 MG CAPS    Take 1 capsule by mouth daily.   METFORMIN (GLUCOPHAGE) 1000 MG TABLET    Take 1 tablet (1,000 mg total) by mouth 2 (two) times daily with a meal.   ROSUVASTATIN (CRESTOR) 20 MG TABLET    Take 1 tablet by mouth daily.   VALACYCLOVIR (VALTREX) 1000 MG TABLET    Take 1,000 mg by mouth 3 (three) times daily.  Modified Medications   Modified Medication Previous Medication   EMTRICITABINE-RILPIVIR-TENOFOVIR AF (ODEFSEY) 200-25-25 MG TABS TABLET ODEFSEY 200-25-25 MG TABS tablet      TAKE 1 TABLET BY MOUTH EVERY DAY WITH BREAKFAST    TAKE 1 TABLET BY MOUTH EVERY  DAY WITH BREAKFAST  Discontinued Medications   No medications on file      Past Medical History:  Diagnosis Date   History of thrush    HIV (human immunodeficiency virus infection) ([place])    Followed by Dr. Johnnye Sima in [facility].    Hyperlipidemia    Hypertension    Insomnia    Tobacco abuse    Type II diabetes mellitus (HCC)     Social History   Tobacco Use   Smoking status: Every Day    Packs/day: 1.00    Years: 20.00    Pack years: 20.00    Types: Cigarettes   Smokeless tobacco: Never   Tobacco comments:    3-4 cigarettes a day   Substance Use Topics   Alcohol use: No   Drug use: No    Family History  Problem Relation Age of Onset   Coronary artery disease Other    Diabetes Other    Kidney failure Mother     Allergies  Allergen Reactions   Sulfonamide Derivatives Anaphylaxis    REACTION: skin welps, tongue swells,  throat closes up    Review of Systems  Constitutional: Negative.   Respiratory: Negative.    Cardiovascular: Negative.   Genitourinary: Negative.   Skin: Negative.     OBJECTIVE:    Vitals:   11/11/21 1553  BP: (!) 146/81  Pulse: 90  Temp: 97.8 F (36.6 C)  TempSrc: Temporal  Weight: 231 lb (104.8 kg)     Body mass index is 31.33 kg/m.  Physical Exam Constitutional:      General: He is not in acute distress.    Appearance: Normal appearance.  HENT:     Head: Normocephalic and atraumatic.  Pulmonary:     Effort: Pulmonary effort is normal. No respiratory distress.  Musculoskeletal:        General: Normal range of motion.  Skin:    General: Skin is warm and dry.     Findings: No rash.  Neurological:     General: No focal deficit present.     Mental Status: He is alert and oriented to person, place, and time.  Psychiatric:        Mood and Affect: Mood normal.        Behavior: Behavior normal.    Labs and Microbiology: CMP Latest Ref Rng & Units 05/12/2021 11/11/2020 06/02/2020  Glucose 65 - 99 mg/dL 242(H) 117(H) 227(H)   BUN 7 - 25 mg/dL 16 23 51(H)  Creatinine 0.70 - 1.33 mg/dL 1.20 1.26 2.91(H)  Sodium 135 - 146 mmol/L 140 141 138  Potassium 3.5 - 5.3 mmol/L 4.0 4.4 4.2  Chloride 98 - 110 mmol/L 98 103 100  CO2 20 - 32 mmol/L 30 27 22   Calcium 8.6 - 10.3 mg/dL 9.3 9.8 9.4  Total Protein 6.1 - 8.1 g/dL 6.7 6.7 7.0  Total Bilirubin 0.2 - 1.2 mg/dL 0.2 0.2 0.5  Alkaline Phos 38 - 126 U/L - - 59  AST 10 - 35 U/L 28 41(H) 33  ALT 9 - 46 U/L 35 54(H) 67(H)   CBC Latest Ref Rng & Units 05/12/2021 11/11/2020 06/02/2020  WBC 3.8 - 10.8 Thousand/uL 5.1 6.3 6.5  Hemoglobin 13.2 - 17.1 g/dL 13.2 14.1 12.3(L)  Hematocrit 38.5 - 50.0 % 39.6 39.9 36.1(L)  Platelets 140 - 400 Thousand/uL 236 289 199     Lab Results  Component Value Date   HIV1RNAQUANT <20 (H) 05/12/2021   HIV1RNAQUANT <20 (H) 11/11/2020   HIV1RNAQUANT <20 DETECTED (A) 04/22/2020   CD4TABS 502 05/12/2021   CD4TABS 531 11/11/2020   CD4TABS 483 04/22/2020    RPR and STI: Lab Results  Component Value Date   LABRPR NON-REACTIVE 05/12/2021   LABRPR NON-REACTIVE 11/11/2020   LABRPR NON-REACTIVE 04/22/2020   LABRPR NON-REACTIVE 06/02/2019   LABRPR NON-REACTIVE 08/07/2018    STI Results GC CT  06/02/2019 Negative Negative  08/07/2018 Negative Negative  01/24/2017 Negative Negative  03/08/2016 Negative Negative    Hepatitis B: Lab Results  Component Value Date   HEPBSAB NEG 09/10/2013   HEPBSAG NEG 05/01/2007   HEPBCAB NEG 05/01/2007   Hepatitis C: No results found for: HEPCAB, HCVRNAPCRQN Hepatitis A: Lab Results  Component Value Date   HAV NEG 05/01/2007   Lipids: Lab Results  Component Value Date   CHOL 264 (H) 11/11/2020   TRIG 586 (H) 11/11/2020   HDL 36 (L) 11/11/2020   CHOLHDL 7.3 (H) 11/11/2020   VLDL 63 (H) 01/24/2017   LDLCALC  11/11/2020     Comment:     . LDL cholesterol not calculated. Triglyceride levels greater  than 400 mg/dL invalidate calculated LDL results. . Reference range: <100 . Desirable range  <100 mg/dL for primary prevention;   <70 mg/dL for patients with CHD or diabetic patients  with > or = 2 CHD risk factors. Marland Kitchen LDL-C is now calculated using the Martin-Hopkins  calculation, which is a validated novel method providing  better accuracy than the Friedewald equation in the  estimation of LDL-C.  Horald Pollen et al. Lenox Ahr. 5638;937(34): 2061-2068  (http://education.[facility].com/faq/FAQ164)      ASSESSMENT & PLAN:    Human immunodeficiency virus disease (HCC) He is doing well on Odefsey and reports adherence without any missed doses.  He follows with Dr [name].  Will check labs today and have him follow up again in about 6 months with Dr [name].  Refills sent on his Odefsey today.   CKD stage 3 secondary to diabetes (HCC) Creatinine has been stable and he is following with nephrology.  Will repeat labs today.  Type 2 diabetes mellitus without complications (HCC) Poorly controlled with last A1c about 12 per his report.  Have encouraged follow up with PCP to control this better.   Vaccine counseling Flu shot done today.   Orders Placed This Encounter  Procedures   HIV-1 RNA quant-no reflex-bld   COMPLETE METABOLIC PANEL WITH GFR   T-helper cells (CD4) count   RPR   CBC     Vedia Coffer for Infectious Disease [facility] 11/11/2021, 4:13 PM

## 2021-11-11 NOTE — Assessment & Plan Note (Signed)
Creatinine has been stable and he is following with nephrology.  Will repeat labs today.

## 2021-11-12 ENCOUNTER — Telehealth: Payer: Self-pay | Admitting: Internal Medicine

## 2021-11-12 NOTE — Telephone Encounter (Signed)
Called pt x2. Left message to call back Bglc [phone], anion gap 12. He is at risk for DKA, although anion gap normal at this point.

## 2021-11-14 LAB — CBC
HCT: 45.6 % (ref 38.5–50.0)
Hemoglobin: 14.7 g/dL (ref 13.2–17.1)
MCH: 32.7 pg (ref 27.0–33.0)
MCHC: 32.2 g/dL (ref 32.0–36.0)
MCV: 101.6 fL — ABNORMAL HIGH (ref 80.0–100.0)
MPV: 10.6 fL (ref 7.5–12.5)
Platelets: 242 10*3/uL (ref 140–400)
RBC: 4.49 10*6/uL (ref 4.20–5.80)
RDW: 12 % (ref 11.0–15.0)
WBC: 6.3 10*3/uL (ref 3.8–10.8)

## 2021-11-14 LAB — COMPLETE METABOLIC PANEL WITH GFR
AG Ratio: 1.2 (calc) (ref 1.0–2.5)
ALT: 29 U/L (ref 9–46)
AST: 16 U/L (ref 10–35)
Albumin: 4 g/dL (ref 3.6–5.1)
Alkaline phosphatase (APISO): 119 U/L (ref 35–144)
BUN/Creatinine Ratio: 17 (calc) (ref 6–22)
BUN: 31 mg/dL — ABNORMAL HIGH (ref 7–25)
CO2: 28 mmol/L (ref 20–32)
Calcium: 9.9 mg/dL (ref 8.6–10.3)
Chloride: 86 mmol/L — ABNORMAL LOW (ref 98–110)
Creat: 1.82 mg/dL — ABNORMAL HIGH (ref 0.70–1.30)
Globulin: 3.4 g/dL (calc) (ref 1.9–3.7)
Glucose, Bld: 938 mg/dL (ref 65–99)
Potassium: 5.4 mmol/L — ABNORMAL HIGH (ref 3.5–5.3)
Sodium: 126 mmol/L — ABNORMAL LOW (ref 135–146)
Total Bilirubin: 0.4 mg/dL (ref 0.2–1.2)
Total Protein: 7.4 g/dL (ref 6.1–8.1)
eGFR: 44 mL/min/{1.73_m2} — ABNORMAL LOW (ref >=60)

## 2021-11-14 LAB — HIV-1 RNA QUANT-NO REFLEX-BLD
HIV 1 RNA Quant: 35 Copies/mL — ABNORMAL HIGH
HIV-1 RNA Quant, Log: 1.55 Log cps/mL — ABNORMAL HIGH

## 2021-11-14 LAB — T-HELPER CELLS (CD4) COUNT (NOT AT ARMC)
Absolute CD4: 408 cells/uL — ABNORMAL LOW (ref 490–1740)
CD4 T Helper %: 45 % (ref 30–61)
Total lymphocyte count: 916 cells/uL (ref 850–3900)

## 2021-11-14 LAB — RPR: RPR Ser Ql: NONREACTIVE

## 2021-11-15 ENCOUNTER — Telehealth: Payer: Self-pay

## 2021-11-15 NOTE — Telephone Encounter (Signed)
-----   Message from Kathlynn Grate, DO sent at 11/15/2021  1:44 PM EST ----- I called patient and discussed his HIV lab results.  More importantly, I reminded him of Dr Doristine Church voicemail from this weekend regarding elevated blood sugar and creatinine.  Have advised him to follow up with his PCP regarding this and to repeat labs.   He states he will and otherwise feels fine and states sugars have been better controlled at home so he was surprised to hear it was this high.  Can we reach out to him and try to get him scheduled for follow up in about 9 months?  Thanks

## 2021-11-15 NOTE — Telephone Encounter (Signed)
Scheduled patient for 9 month follow up. Reminded him to please follow up with his PCP regarding recent labs (glucose and creatinine). Patient verbalized understanding and has no further questions.   [name], RN

## 2022-05-16 ENCOUNTER — Other Ambulatory Visit: Payer: Self-pay | Admitting: Internal Medicine

## 2022-05-16 DIAGNOSIS — B20 Human immunodeficiency virus [HIV] disease: Secondary | ICD-10-CM

## 2022-05-28 ENCOUNTER — Encounter: Payer: Self-pay | Admitting: Infectious Diseases

## 2022-07-19 ENCOUNTER — Ambulatory Visit: Payer: Medicare Other

## 2022-07-19 ENCOUNTER — Other Ambulatory Visit: Payer: Self-pay

## 2022-08-15 ENCOUNTER — Ambulatory Visit: Payer: Self-pay | Admitting: Internal Medicine

## 2022-08-17 ENCOUNTER — Ambulatory Visit: Payer: Self-pay | Admitting: Internal Medicine

## 2022-09-27 ENCOUNTER — Other Ambulatory Visit: Payer: Self-pay | Admitting: Internal Medicine

## 2022-09-27 DIAGNOSIS — B20 Human immunodeficiency virus [HIV] disease: Secondary | ICD-10-CM

## 2023-01-10 ENCOUNTER — Other Ambulatory Visit: Payer: Self-pay

## 2023-01-10 ENCOUNTER — Encounter (HOSPITAL_COMMUNITY): Payer: Self-pay

## 2023-01-10 ENCOUNTER — Emergency Department (HOSPITAL_COMMUNITY)
Admission: EM | Admit: 2023-01-10 | Discharge: 2023-01-11 | Discharge status: Against Medical Advice | Payer: Medicare HMO | Attending: Emergency Medicine | Admitting: Emergency Medicine

## 2023-01-10 DIAGNOSIS — R509 Fever, unspecified: Secondary | ICD-10-CM | POA: Insufficient documentation

## 2023-01-10 DIAGNOSIS — R42 Dizziness and giddiness: Secondary | ICD-10-CM | POA: Insufficient documentation

## 2023-01-10 DIAGNOSIS — E119 Type 2 diabetes mellitus without complications: Secondary | ICD-10-CM | POA: Diagnosis not present

## 2023-01-10 DIAGNOSIS — Z5321 Procedure and treatment not carried out due to patient leaving prior to being seen by health care provider: Secondary | ICD-10-CM | POA: Insufficient documentation

## 2023-01-10 DIAGNOSIS — R519 Headache, unspecified: Secondary | ICD-10-CM | POA: Diagnosis not present

## 2023-01-10 DIAGNOSIS — L02215 Cutaneous abscess of perineum: Secondary | ICD-10-CM | POA: Diagnosis present

## 2023-01-10 LAB — CBC WITH DIFFERENTIAL/PLATELET
Abs Immature Granulocytes: 0.06 10*3/uL (ref 0.00–0.07)
Basophils Absolute: 0 10*3/uL (ref 0.0–0.1)
Basophils Relative: 0 %
Eosinophils Absolute: 0.1 10*3/uL (ref 0.0–0.5)
Eosinophils Relative: 1 %
HCT: 40.3 % (ref 39.0–52.0)
Hemoglobin: 13.1 g/dL (ref 13.0–17.0)
Immature Granulocytes: 1 %
Lymphocytes Relative: 12 %
Lymphs Abs: 1.3 10*3/uL (ref 0.7–4.0)
MCH: 33 pg (ref 26.0–34.0)
MCHC: 32.5 g/dL (ref 30.0–36.0)
MCV: 101.5 fL — ABNORMAL HIGH (ref 80.0–100.0)
Monocytes Absolute: 1.3 10*3/uL — ABNORMAL HIGH (ref 0.1–1.0)
Monocytes Relative: 12 %
Neutro Abs: 8.1 10*3/uL — ABNORMAL HIGH (ref 1.7–7.7)
Neutrophils Relative %: 74 %
Platelets: 156 10*3/uL (ref 150–400)
RBC: 3.97 MIL/uL — ABNORMAL LOW (ref 4.22–5.81)
RDW: 13.2 % (ref 11.5–15.5)
WBC: 10.9 10*3/uL — ABNORMAL HIGH (ref 4.0–10.5)
nRBC: 0 % (ref 0.0–0.2)

## 2023-01-10 LAB — CBG MONITORING, ED: Glucose-Capillary: 223 mg/dL — ABNORMAL HIGH (ref 70–99)

## 2023-01-10 LAB — COMPREHENSIVE METABOLIC PANEL
ALT: 37 U/L (ref 0–44)
AST: 34 U/L (ref 15–41)
Albumin: 3 g/dL — ABNORMAL LOW (ref 3.5–5.0)
Alkaline Phosphatase: 90 U/L (ref 38–126)
Anion gap: 12 (ref 5–15)
BUN: 12 mg/dL (ref 6–20)
CO2: 26 mmol/L (ref 22–32)
Calcium: 8.7 mg/dL — ABNORMAL LOW (ref 8.9–10.3)
Chloride: 99 mmol/L (ref 98–111)
Creatinine, Ser: 1.71 mg/dL — ABNORMAL HIGH (ref 0.61–1.24)
GFR, Estimated: 47 mL/min — ABNORMAL LOW (ref >60)
Glucose, Bld: 228 mg/dL — ABNORMAL HIGH (ref 70–99)
Potassium: 3.2 mmol/L — ABNORMAL LOW (ref 3.5–5.1)
Sodium: 137 mmol/L (ref 135–145)
Total Bilirubin: 0.4 mg/dL (ref 0.3–1.2)
Total Protein: 6.4 g/dL — ABNORMAL LOW (ref 6.5–8.1)

## 2023-01-10 LAB — URINALYSIS, ROUTINE W REFLEX MICROSCOPIC
Bacteria, UA: NONE SEEN
Bilirubin Urine: NEGATIVE
Glucose, UA: >=500 mg/dL — AB
Hgb urine dipstick: NEGATIVE
Ketones, ur: NEGATIVE mg/dL
Leukocytes,Ua: NEGATIVE
Nitrite: NEGATIVE
Protein, ur: 100 mg/dL — AB
Specific Gravity, Urine: 1.018 (ref 1.005–1.030)
pH: 6 (ref 5.0–8.0)

## 2023-01-10 NOTE — ED Triage Notes (Signed)
Pt came in POV d/t a boil/abscess that he reports has been there for about 5 days so far. Pt has had a boil there in the past & was hospitalized for ABT. Before coming in today for eval has reports fevers, dizziness, HA & its already draining.

## 2023-01-10 NOTE — ED Provider Triage Note (Signed)
Emergency Medicine Provider Triage Evaluation Note  Harold Crosby , a 52 y.o. male  was evaluated in triage.  Pt complains of boil/abscess in his perineum region.  States it has been there for about 5 days so far.  Patient has had boil there in the past and was hospitalized.  He also reports fevers, dizziness, headache, and draining of his abscess.  Patient has had sepsis related to abscess before.  History significant for DM, states his sugars at home have been very high.  Review of Systems  Positive: As above Negative: As above  Physical Exam  BP 132/70   Pulse 92   Temp 99 F (37.2 C) (Oral)   Resp 17   Ht 6' (1.829 m)   Wt 108.9 kg   SpO2 98%   BMI 32.55 kg/m  Gen:   Awake, no distress   Resp:  Normal effort  MSK:   Moves extremities without difficulty  Other:    Medical Decision Making  Medically screening exam initiated at 2:29 PM.  Appropriate orders placed.  Harold Crosby was informed that the remainder of the evaluation will be completed by another provider, this initial triage assessment does not replace that evaluation, and the importance of remaining in the ED until their evaluation is complete.     Theressa Stamps R, [place] 01/10/23 1435

## 2023-01-11 NOTE — ED Notes (Signed)
PT hasn't answered for vitals signs in hours .

## 2023-01-12 ENCOUNTER — Emergency Department: Payer: Medicare HMO

## 2023-01-12 ENCOUNTER — Other Ambulatory Visit: Payer: Self-pay

## 2023-01-12 ENCOUNTER — Ambulatory Visit (HOSPITAL_COMMUNITY): Payer: Medicare HMO

## 2023-01-12 ENCOUNTER — Inpatient Hospital Stay
Admission: EM | Admit: 2023-01-12 | Discharge: 2023-01-16 | DRG: 571 | Disposition: A | Discharge status: Home / Self Care | Payer: Medicare HMO | Attending: Internal Medicine | Admitting: Internal Medicine

## 2023-01-12 DIAGNOSIS — N1832 Chronic kidney disease, stage 3b: Secondary | ICD-10-CM | POA: Diagnosis present

## 2023-01-12 DIAGNOSIS — E119 Type 2 diabetes mellitus without complications: Secondary | ICD-10-CM

## 2023-01-12 DIAGNOSIS — L02215 Cutaneous abscess of perineum: Secondary | ICD-10-CM | POA: Diagnosis present

## 2023-01-12 DIAGNOSIS — I509 Heart failure, unspecified: Secondary | ICD-10-CM | POA: Diagnosis not present

## 2023-01-12 DIAGNOSIS — Z21 Asymptomatic human immunodeficiency virus [HIV] infection status: Secondary | ICD-10-CM | POA: Diagnosis present

## 2023-01-12 DIAGNOSIS — N183 Chronic kidney disease, stage 3 unspecified: Secondary | ICD-10-CM | POA: Diagnosis not present

## 2023-01-12 DIAGNOSIS — Z833 Family history of diabetes mellitus: Secondary | ICD-10-CM | POA: Diagnosis not present

## 2023-01-12 DIAGNOSIS — I5030 Unspecified diastolic (congestive) heart failure: Secondary | ICD-10-CM | POA: Diagnosis not present

## 2023-01-12 DIAGNOSIS — E872 Acidosis, unspecified: Secondary | ICD-10-CM

## 2023-01-12 DIAGNOSIS — R739 Hyperglycemia, unspecified: Secondary | ICD-10-CM | POA: Diagnosis not present

## 2023-01-12 DIAGNOSIS — I130 Hypertensive heart and chronic kidney disease with heart failure and stage 1 through stage 4 chronic kidney disease, or unspecified chronic kidney disease: Secondary | ICD-10-CM | POA: Diagnosis present

## 2023-01-12 DIAGNOSIS — Z7984 Long term (current) use of oral hypoglycemic drugs: Secondary | ICD-10-CM | POA: Diagnosis not present

## 2023-01-12 DIAGNOSIS — Z79899 Other long term (current) drug therapy: Secondary | ICD-10-CM | POA: Diagnosis not present

## 2023-01-12 DIAGNOSIS — Z7982 Long term (current) use of aspirin: Secondary | ICD-10-CM | POA: Diagnosis not present

## 2023-01-12 DIAGNOSIS — Z6832 Body mass index (BMI) 32.0-32.9, adult: Secondary | ICD-10-CM

## 2023-01-12 DIAGNOSIS — Z841 Family history of disorders of kidney and ureter: Secondary | ICD-10-CM | POA: Diagnosis not present

## 2023-01-12 DIAGNOSIS — G4700 Insomnia, unspecified: Secondary | ICD-10-CM | POA: Diagnosis present

## 2023-01-12 DIAGNOSIS — E785 Hyperlipidemia, unspecified: Secondary | ICD-10-CM | POA: Diagnosis present

## 2023-01-12 DIAGNOSIS — N493 Fournier gangrene: Secondary | ICD-10-CM | POA: Diagnosis present

## 2023-01-12 DIAGNOSIS — E669 Obesity, unspecified: Secondary | ICD-10-CM | POA: Diagnosis present

## 2023-01-12 DIAGNOSIS — E1122 Type 2 diabetes mellitus with diabetic chronic kidney disease: Secondary | ICD-10-CM | POA: Diagnosis present

## 2023-01-12 DIAGNOSIS — Z882 Allergy status to sulfonamides status: Secondary | ICD-10-CM

## 2023-01-12 DIAGNOSIS — L03315 Cellulitis of perineum: Secondary | ICD-10-CM | POA: Diagnosis present

## 2023-01-12 DIAGNOSIS — F17210 Nicotine dependence, cigarettes, uncomplicated: Secondary | ICD-10-CM | POA: Diagnosis present

## 2023-01-12 DIAGNOSIS — Z8249 Family history of ischemic heart disease and other diseases of the circulatory system: Secondary | ICD-10-CM | POA: Diagnosis not present

## 2023-01-12 DIAGNOSIS — Z794 Long term (current) use of insulin: Secondary | ICD-10-CM

## 2023-01-12 DIAGNOSIS — R0681 Apnea, not elsewhere classified: Secondary | ICD-10-CM | POA: Diagnosis present

## 2023-01-12 DIAGNOSIS — I5032 Chronic diastolic (congestive) heart failure: Secondary | ICD-10-CM | POA: Diagnosis not present

## 2023-01-12 LAB — CBC WITH DIFFERENTIAL/PLATELET
Abs Immature Granulocytes: 0.08 10*3/uL — ABNORMAL HIGH (ref 0.00–0.07)
Basophils Absolute: 0 10*3/uL (ref 0.0–0.1)
Basophils Relative: 0 %
Eosinophils Absolute: 0 10*3/uL (ref 0.0–0.5)
Eosinophils Relative: 0 %
HCT: 41.9 % (ref 39.0–52.0)
Hemoglobin: 13.6 g/dL (ref 13.0–17.0)
Immature Granulocytes: 1 %
Lymphocytes Relative: 8 %
Lymphs Abs: 0.8 10*3/uL (ref 0.7–4.0)
MCH: 31.9 pg (ref 26.0–34.0)
MCHC: 32.5 g/dL (ref 30.0–36.0)
MCV: 98.1 fL (ref 80.0–100.0)
Monocytes Absolute: 0.6 10*3/uL (ref 0.1–1.0)
Monocytes Relative: 6 %
Neutro Abs: 8 10*3/uL — ABNORMAL HIGH (ref 1.7–7.7)
Neutrophils Relative %: 85 %
Platelets: 174 10*3/uL (ref 150–400)
RBC: 4.27 MIL/uL (ref 4.22–5.81)
RDW: 13.4 % (ref 11.5–15.5)
WBC: 9.5 10*3/uL (ref 4.0–10.5)
nRBC: 0 % (ref 0.0–0.2)

## 2023-01-12 LAB — BASIC METABOLIC PANEL
Anion gap: 11 (ref 5–15)
BUN: 17 mg/dL (ref 6–20)
CO2: 25 mmol/L (ref 22–32)
Calcium: 8.6 mg/dL — ABNORMAL LOW (ref 8.9–10.3)
Chloride: 98 mmol/L (ref 98–111)
Creatinine, Ser: 1.67 mg/dL — ABNORMAL HIGH (ref 0.61–1.24)
GFR, Estimated: 49 mL/min — ABNORMAL LOW (ref >60)
Glucose, Bld: 254 mg/dL — ABNORMAL HIGH (ref 70–99)
Potassium: 3.2 mmol/L — ABNORMAL LOW (ref 3.5–5.1)
Sodium: 134 mmol/L — ABNORMAL LOW (ref 135–145)

## 2023-01-12 LAB — URINALYSIS, ROUTINE W REFLEX MICROSCOPIC
Bacteria, UA: NONE SEEN
Bilirubin Urine: NEGATIVE
Glucose, UA: >=500 mg/dL — AB
Ketones, ur: NEGATIVE mg/dL
Leukocytes,Ua: NEGATIVE
Nitrite: NEGATIVE
Protein, ur: 100 mg/dL — AB
Specific Gravity, Urine: 1.009 (ref 1.005–1.030)
Squamous Epithelial / HPF: NONE SEEN /HPF (ref 0–5)
WBC, UA: NONE SEEN WBC/hpf (ref 0–5)
pH: 7 (ref 5.0–8.0)

## 2023-01-12 LAB — HEMOGLOBIN A1C
Hgb A1c MFr Bld: 10.3 % — ABNORMAL HIGH (ref 4.8–5.6)
Mean Plasma Glucose: 248.91 mg/dL

## 2023-01-12 LAB — GLUCOSE, CAPILLARY
Glucose-Capillary: 86 mg/dL (ref 70–99)
Glucose-Capillary: 168 mg/dL — ABNORMAL HIGH (ref 70–99)

## 2023-01-12 LAB — LACTIC ACID, PLASMA
Lactic Acid, Venous: 3.2 mmol/L (ref 0.5–1.9)
Lactic Acid, Venous: 3.1 mmol/L (ref 0.5–1.9)

## 2023-01-12 LAB — BRAIN NATRIURETIC PEPTIDE: B Natriuretic Peptide: 222.7 pg/mL — ABNORMAL HIGH (ref 0.0–100.0)

## 2023-01-12 MED ORDER — MORPHINE SULFATE (PF) 4 MG/ML IV SOLN
4.0000 mg | Freq: Once | INTRAVENOUS | Status: AC
  Administered 2023-01-12: 4 mg via INTRAVENOUS
  Filled 2023-01-12: qty 1

## 2023-01-12 MED ORDER — ROSUVASTATIN CALCIUM 10 MG PO TABS
20.0000 mg | ORAL_TABLET | Freq: Every day | ORAL | Status: DC
  Administered 2023-01-12 – 2023-01-15 (×4): 20 mg via ORAL
  Filled 2023-01-12 (×4): qty 2

## 2023-01-12 MED ORDER — SODIUM CHLORIDE 0.9 % IV BOLUS
1000.0000 mL | Freq: Once | INTRAVENOUS | Status: AC
  Administered 2023-01-12: 1000 mL via INTRAVENOUS

## 2023-01-12 MED ORDER — METRONIDAZOLE 500 MG/100ML IV SOLN
500.0000 mg | Freq: Two times a day (BID) | INTRAVENOUS | Status: DC
  Administered 2023-01-12 – 2023-01-15 (×7): 500 mg via INTRAVENOUS
  Filled 2023-01-12 (×9): qty 100

## 2023-01-12 MED ORDER — ADULT MULTIVITAMIN W/MINERALS CH
1.0000 | ORAL_TABLET | Freq: Every day | ORAL | Status: DC
  Administered 2023-01-12 – 2023-01-16 (×5): 1 via ORAL
  Filled 2023-01-12 (×5): qty 1

## 2023-01-12 MED ORDER — SODIUM CHLORIDE 0.9% FLUSH
3.0000 mL | Freq: Two times a day (BID) | INTRAVENOUS | Status: DC
  Administered 2023-01-12 – 2023-01-15 (×7): 3 mL via INTRAVENOUS

## 2023-01-12 MED ORDER — SODIUM CHLORIDE 0.9 % IV SOLN
2.0000 g | Freq: Three times a day (TID) | INTRAVENOUS | Status: DC
  Administered 2023-01-12 – 2023-01-15 (×10): 2 g via INTRAVENOUS
  Filled 2023-01-12: qty 2
  Filled 2023-01-12 (×7): qty 12.5
  Filled 2023-01-12 (×2): qty 2
  Filled 2023-01-12 (×3): qty 12.5

## 2023-01-12 MED ORDER — ACETAMINOPHEN 650 MG RE SUPP: 650.0000 mg | Freq: Four times a day (QID) | RECTAL | Status: DC | PRN

## 2023-01-12 MED ORDER — VANCOMYCIN HCL 2000 MG/400ML IV SOLN
2000.0000 mg | Freq: Once | INTRAVENOUS | Status: AC
  Administered 2023-01-12: 2000 mg via INTRAVENOUS
  Filled 2023-01-12: qty 400

## 2023-01-12 MED ORDER — GABAPENTIN 600 MG PO TABS
1200.0000 mg | ORAL_TABLET | Freq: Three times a day (TID) | ORAL | Status: DC
  Filled 2023-01-12: qty 2

## 2023-01-12 MED ORDER — PIPERACILLIN-TAZOBACTAM 3.375 G IVPB 30 MIN
3.3750 g | Freq: Once | INTRAVENOUS | Status: AC
  Administered 2023-01-12: 3.375 g via INTRAVENOUS
  Filled 2023-01-12: qty 50

## 2023-01-12 MED ORDER — ONDANSETRON HCL 4 MG/2ML IJ SOLN: 4.0000 mg | Freq: Four times a day (QID) | INTRAMUSCULAR | Status: DC | PRN

## 2023-01-12 MED ORDER — TIZANIDINE HCL 4 MG PO TABS
4.0000 mg | ORAL_TABLET | Freq: Three times a day (TID) | ORAL | Status: DC
  Administered 2023-01-12 – 2023-01-16 (×10): 4 mg via ORAL
  Filled 2023-01-12 (×11): qty 1

## 2023-01-12 MED ORDER — VANCOMYCIN HCL 1500 MG/300ML IV SOLN
1500.0000 mg | INTRAVENOUS | Status: DC
  Administered 2023-01-13 – 2023-01-15 (×3): 1500 mg via INTRAVENOUS
  Filled 2023-01-12 (×3): qty 300

## 2023-01-12 MED ORDER — FUROSEMIDE 40 MG PO TABS
80.0000 mg | ORAL_TABLET | Freq: Three times a day (TID) | ORAL | Status: DC
  Administered 2023-01-12 – 2023-01-16 (×10): 80 mg via ORAL
  Filled 2023-01-12 (×10): qty 2

## 2023-01-12 MED ORDER — ACETAMINOPHEN 325 MG PO TABS
650.0000 mg | ORAL_TABLET | Freq: Four times a day (QID) | ORAL | Status: DC | PRN
  Administered 2023-01-13 (×3): 650 mg via ORAL
  Filled 2023-01-12 (×3): qty 2

## 2023-01-12 MED ORDER — IOHEXOL 300 MG/ML  SOLN
100.0000 mL | Freq: Once | INTRAMUSCULAR | Status: AC | PRN
  Administered 2023-01-12: 100 mL via INTRAVENOUS

## 2023-01-12 MED ORDER — GABAPENTIN 400 MG PO CAPS
1200.0000 mg | ORAL_CAPSULE | Freq: Three times a day (TID) | ORAL | Status: DC
  Administered 2023-01-12 – 2023-01-16 (×10): 1200 mg via ORAL
  Filled 2023-01-12 (×11): qty 3

## 2023-01-12 MED ORDER — INSULIN GLARGINE-YFGN 100 UNIT/ML ~~LOC~~ SOLN
30.0000 [IU] | Freq: Every day | SUBCUTANEOUS | Status: DC
  Administered 2023-01-12: 30 [IU] via SUBCUTANEOUS
  Filled 2023-01-12 (×3): qty 0.3

## 2023-01-12 MED ORDER — EMTRICITAB-RILPIVIR-TENOFOV AF 200-25-25 MG PO TABS
1.0000 | ORAL_TABLET | Freq: Every day | ORAL | Status: DC
  Administered 2023-01-13 – 2023-01-16 (×3): 1 via ORAL
  Filled 2023-01-12 (×4): qty 1

## 2023-01-12 MED ORDER — HYDROCODONE-ACETAMINOPHEN 5-325 MG PO TABS
1.0000 | ORAL_TABLET | ORAL | Status: DC | PRN
  Administered 2023-01-12 (×2): 1 via ORAL
  Administered 2023-01-14: 2 via ORAL
  Filled 2023-01-12: qty 2
  Filled 2023-01-12: qty 1
  Filled 2023-01-12: qty 2
  Filled 2023-01-12: qty 1

## 2023-01-12 MED ORDER — ALLOPURINOL 100 MG PO TABS
100.0000 mg | ORAL_TABLET | Freq: Every day | ORAL | Status: DC
  Administered 2023-01-13 – 2023-01-16 (×4): 100 mg via ORAL
  Filled 2023-01-12 (×4): qty 1

## 2023-01-12 MED ORDER — INSULIN ASPART 100 UNIT/ML IJ SOLN
0.0000 [IU] | Freq: Three times a day (TID) | INTRAMUSCULAR | Status: DC
  Administered 2023-01-13: 30 [IU] via SUBCUTANEOUS
  Administered 2023-01-13: 11 [IU] via SUBCUTANEOUS
  Administered 2023-01-13 – 2023-01-14 (×2): 7 [IU] via SUBCUTANEOUS
  Administered 2023-01-14 – 2023-01-15 (×3): 15 [IU] via SUBCUTANEOUS
  Administered 2023-01-15: 7 [IU] via SUBCUTANEOUS
  Filled 2023-01-12 (×9): qty 1

## 2023-01-12 MED ORDER — ONDANSETRON HCL 4 MG PO TABS: 4.0000 mg | ORAL_TABLET | Freq: Four times a day (QID) | ORAL | Status: DC | PRN

## 2023-01-12 MED ORDER — HYDROMORPHONE HCL 1 MG/ML IJ SOLN
0.5000 mg | INTRAMUSCULAR | Status: DC | PRN
  Administered 2023-01-14 – 2023-01-15 (×4): 1 mg via INTRAVENOUS
  Filled 2023-01-12 (×4): qty 1

## 2023-01-12 MED ORDER — POLYETHYLENE GLYCOL 3350 17 G PO PACK: 17.0000 g | PACK | Freq: Every day | ORAL | Status: DC | PRN

## 2023-01-12 MED ORDER — FINASTERIDE 5 MG PO TABS
5.0000 mg | ORAL_TABLET | Freq: Every day | ORAL | Status: DC
  Administered 2023-01-13 – 2023-01-16 (×4): 5 mg via ORAL
  Filled 2023-01-12 (×4): qty 1

## 2023-01-12 NOTE — ED Triage Notes (Signed)
Patient to ED via POV abscess on his buttock. Patient states it has been there for the past 2 weeks.

## 2023-01-12 NOTE — H&P (Signed)
History and Physical    Patient: Harold Crosby DOB: February 24, 1971 DOA: 01/12/2023 DOS: the patient was seen and examined on 01/12/2023 [facility]  Patient coming from: Home  Chief Complaint:  Chief Complaint  Patient presents with   Abscess   HPI: Harold Crosby is a 52 y.o. male with medical history significant of uncontrolled type 2 diabetes, CKD stage IIIb, HIV, hypertension, HFpEF with last EF of 60-65%, who presents to the ED due to a perirectal abscess.  Harold Crosby states approximately 2 weeks ago, he felt the area around his rectum was becoming swollen.  Then approximately 1 week later, he experienced pain with scant drainage that was pink-tinged.  His symptoms have progressively worsened and he has developed nausea, vomiting and rigors.  He denies any measured fevers.  He states he experienced a thing similar approximately 5 years ago.  Harold Crosby also states that he has been having progressively worsening dyspnea on exertion and occasional shortness of breath at rest.  In addition, his lower extremity swelling has worsened despite compliance with Lasix 80 mg 3 times daily.  ED course: On arrival to the ED, patient was afebrile at 98.2 with heart rate of 105 and blood pressure 152/61.  He was saturating at 96% on room air. Initial workup remarkable for WBC of 9.5, potassium 3.2, glucose 254, creatinine 1.67, GFR 49.  Initial lactic acid 3.2.  CT of the pelvis was obtained that demonstrated inflammatory stranding with thickening and ill-defined fluid along the low left perineum caudal to the anal canal immediately along the gluteal cleft extending to the inguinal crease and base of the scrotum.  General surgery was consulted and will evaluate.  TRH consulted for admission.  Review of Systems: As mentioned in the history of present illness. All other systems reviewed and are negative. Past Medical History:  Diagnosis Date   History of thrush    HIV (human  immunodeficiency virus infection) ([place])    Followed by Dr. Johnnye Sima in [facility].    Hyperlipidemia    Hypertension    Insomnia    Tobacco abuse    Type II diabetes mellitus ([place])    History reviewed. No pertinent surgical history. Social History:  reports that he has been smoking cigarettes. He has a 20.00 pack-year smoking history. He has never used smokeless tobacco. He reports that he does not drink alcohol and does not use drugs.  Allergies  Allergen Reactions   Sulfonamide Derivatives Anaphylaxis    REACTION: skin welps, tongue swells, throat closes up    Family History  Problem Relation Age of Onset   Coronary artery disease Other    Diabetes Other    Kidney failure Mother     Prior to Admission medications   Medication Sig Start Date End Date Taking? Authorizing Provider  aspirin 81 MG tablet Take 81 mg by mouth daily.    [provider]  celecoxib (CELEBREX) 100 MG capsule Take 100 mg by mouth 2 (two) times daily as needed. 02/22/21   [provider]  finasteride (PROSCAR) 5 MG tablet Take 5 mg by mouth daily.    [provider]  furosemide (LASIX) 40 MG tablet Take 40 mg by mouth 2 (two) times daily.     [provider]  gabapentin (NEURONTIN) 300 MG capsule Take 2 capsules (600 mg total) by mouth 3 (three) times daily. 08/31/16   Arlis Porta., MD  insulin glargine (LANTUS) 100 UNIT/ML injection Inject 0.45 mLs (  45 Units total) into the skin 2 (two) times daily. 11/24/15   Burns, Tinnie Gens, MD  insulin NPH Human (HUMULIN N,NOVOLIN N) 100 UNIT/ML injection Inject 95 Units into the skin 2 (two) times daily before a meal.     [provider]  insulin regular (NOVOLIN R,HUMULIN R) 100 units/mL injection Inject 95 Units into the skin 2 (two) times daily before a meal.     [provider]  JARDIANCE 10 MG TABS tablet Take 10 mg by mouth daily. 09/05/21   [provider]  lisinopril-hydrochlorothiazide (ZESTORETIC)  20-25 MG tablet TAKE 1 TABLET BY MOUTH EVERY DAY Patient not taking: Reported on 11/11/2021 02/25/20   Ginnie Smart, MD  Meloxicam 10 MG CAPS Take 1 capsule by mouth daily.    [provider]  metFORMIN (GLUCOPHAGE) 1000 MG tablet Take 1 tablet (1,000 mg total) by mouth 2 (two) times daily with a meal. 07/19/12   Margarito Liner, MD  ODEFSEY 200-25-25 MG TABS tablet TAKE 1 TABLET BY MOUTH EVERY DAY WITH BREAKFAST 09/27/22   Ginnie Smart, MD  rosuvastatin (CRESTOR) 20 MG tablet Take 1 tablet by mouth daily. 01/05/21   [provider]  valACYclovir (VALTREX) 1000 MG tablet Take 1,000 mg by mouth 3 (three) times daily.    [provider]    Physical Exam: Vitals:   01/12/23 1029 01/12/23 1047  BP: (!) 152/61   Pulse: (!) 105   Resp: 18   Temp: 98.2 F (36.8 C)   TempSrc: Oral   SpO2: 96%   Weight:  108.8 kg  Height:  6' (1.829 m)   Physical Exam Vitals and nursing note reviewed.  Constitutional:      General: He is not in acute distress.    Appearance: He is obese. He is ill-appearing.  HENT:     Head: Normocephalic and atraumatic.     Mouth/Throat:     Mouth: Mucous membranes are dry.     Pharynx: Oropharynx is clear.  Eyes:     Conjunctiva/sclera: Conjunctivae normal.     Pupils: Pupils are equal, round, and reactive to light.  Cardiovascular:     Rate and Rhythm: Regular rhythm. Tachycardia present.     Heart sounds: No murmur heard.    No gallop.  Pulmonary:     Effort: Pulmonary effort is normal. No respiratory distress.     Breath sounds: Normal breath sounds. No wheezing, rhonchi or rales.  Abdominal:     General: Bowel sounds are normal. There is no distension.     Palpations: Abdomen is soft.     Tenderness: There is no abdominal tenderness. There is no guarding.  Genitourinary:    Rectum: Tenderness present. No external hemorrhoid.     Comments: Along the perirectal area extending towards the scrotum, there is a large area of  erythema and induration.  Approximately 2 cm inferior to the rectum, there is a central opening with purulent drainage expressing, with a overlying scab. Musculoskeletal:     Cervical back: Neck supple.     Right lower leg: 2+ Pitting Edema present.     Left lower leg: 2+ Pitting Edema present.  Skin:    General: Skin is warm and dry.  Neurological:     General: No focal deficit present.     Mental Status: He is alert and oriented to person, place, and time. Mental status is at baseline.  Psychiatric:        Mood and Affect: Mood normal.  Behavior: Behavior normal.    Data Reviewed: CBC with WBC of 9.5, hemoglobin of 13.6, and platelets of 174 BMP with sodium of 134, potassium 3.2, glucose of 254, bicarb of 25, BUN 17, creatinine 1.67, calcium 8.6 and GFR 49 Lactic acid initially 3.2, repeat lactic acid 3.1  CT PELVIS W CONTRAST  Result Date: 01/12/2023 CLINICAL DATA:  Perianal fistula with abscess.  Present for 2 weeks. EXAM: CT PELVIS WITH CONTRAST TECHNIQUE: Multidetector CT imaging of the pelvis was performed using the standard protocol following the bolus administration of intravenous contrast. RADIATION DOSE REDUCTION: This exam was performed according to the departmental dose-optimization program which includes automated exposure control, adjustment of the mA and/or kV according to patient size and/or use of iterative reconstruction technique. CONTRAST:  OMNIPAQUE IOHEXOL 300 MG/ML  SOLN COMPARISON:  CT 11/21/2015 abdomen and pelvis FINDINGS: Urinary Tract: Preserved contours of the urinary bladder. The distal ureters are nondilated. Prominent perinephric stranding noted along the lower retroperitoneum at the edge of the imaging field. Nonspecific. Bowel: The large bowel included in the pelvis has a normal course and caliber. Scattered colonic stool. Normal caliber appendix seen at the edge of the imaging field in the right lower quadrant. Few colonic diverticula are  identified. The small bowel visualized is also nondilated. Vascular/Lymphatic: Normal caliber visualized arterial and venous major vessels in the pelvis. Scattered vascular calcifications along the distal aorta and some of the iliac vessels. No specific abnormal lymph node enlargement present in the pelvis. There are some small nodes in the inguinal regions which are not pathologic by size criteria. Reproductive:  No mass or other significant abnormality Other: Mild anasarca with some skin thickening dorsally. Small bilateral fat containing inguinal hernias. There is some fluid along the scrotal margin with stranding extending along the perineum on both sides caudal to the anal region. There is similar confluent fluid along the margin of the left gluteal cleft inferior anterior and medial. This area is seen without gas and is measured on series 3, image 12 measuring 2.6 by 1.3 cm. There is some tracking of inflammatory changes anteriorly as well along the left side of the scrotal margin as seen on series 3, image 9. A distinct tract to the anal region is not well seen on this CT scan. If there is concern of a true anal fistula tract staging MRI for anal fistula can be performed when clinically appropriate. No clear fluid collections or stranding extending along the ischioanal fossa, above the pelvic floor musculature. Musculoskeletal: Surgical changes along the lower lumbar spine from laminectomies. Scattered degenerative changes are seen in the lower lumbar spine and scattered along the pelvis. IMPRESSION: Inflammatory stranding with thickening and ill-defined fluid along the low left perineum caudal to the anal canal and medially along the gluteal cleft margin with tracking of thickening and stranding anteriorly along the inguinal crease and base of the scrotum. No soft tissue gas or other well-defined fluid collections clearly identified in addition there is no clear tract extending to the anal canal. If there is  further concern of than actual tract and further anatomy is desired, dedicated anal fistula protocol MRI can be performed as clinically indicated. Electronically Signed   By: Karen Kays M.D.   On: 01/12/2023 12:51    There are no new results to review at this time.  Assessment and Plan: * Perineal abscess Patient presenting with 2-week history of perirectal pain, erythema, drainage, rigors, nausea and vomiting.  CT imaging with evidence of inflammatory  stranding and thickening with ill-defined fluid along the left perineum.  The area of inflammation extends into the base of the scrotum.    No evidence of sepsis at this time, however patient is certainly at high risk.  - General surgery consulted; appreciate their recommendations - Remain n.p.o. until general surgery evaluation - Continue vancomycin - Transition Zosyn to cefepime and Flagyl given history of CKD - No further IV fluids at this time - Blood cultures pending  Type 2 diabetes mellitus without complications ([place]) History of uncontrolled diabetes with last A1c over 14% in 2022.  Patient is on a combination of short and long-acting insulin, but cannot recall the names.  - A1c pending - SSI, resistant scale - Semglee 30 units at bedtime - Hold home medications  (HFpEF) heart failure with preserved ejection fraction ([place]) Patient has a history of HFpEF with last EF of 60-65%, currently on Lasix 80 mg 3 times a day.  Despite this, he continues to have 2+ pitting edema up to the knees.  No pulmonary rales or JVD.  He has received IV fluids in the ED due to active infection.  Will hold off on further fluids at this time and monitor fluid status carefully.  - BNP pending - Depending on BNP results, may give IV Lasix - Strict in and out - Daily weights  CKD stage 3 secondary to diabetes [facility]) Renal function currently at baseline.  - Monitor BMP while receiving nephrotoxic agents  Apnea Patient has a history of apnea at home  that has been witnessed by family.  High risk for OSA but no formal sleep study.  - Continuous pulse oximetry  Advance Care Planning:   Code Status: Full Code.  Verified by patient  Consults: General surgery  Family Communication: Patient's partner updated at bedside  Severity of Illness: The appropriate patient status for this patient is INPATIENT. Inpatient status is judged to be reasonable and necessary in order to provide the required intensity of service to ensure the patient's safety. The patient's presenting symptoms, physical exam findings, and initial radiographic and laboratory data in the context of their chronic comorbidities is felt to place them at high risk for further clinical deterioration. Furthermore, it is not anticipated that the patient will be medically stable for discharge from the hospital within 2 midnights of admission.   * I certify that at the point of admission it is my clinical judgment that the patient will require inpatient hospital care spanning beyond 2 midnights from the point of admission due to high intensity of service, high risk for further deterioration and high frequency of surveillance required.*  Author: Jose Persia, MD 01/12/2023 2:55 PM  For on call review [email or web address].

## 2023-01-12 NOTE — Assessment & Plan Note (Addendum)
Renal function currently at baseline.  - Monitor BMP while receiving nephrotoxic agents  Lab Results  Component Value Date   CREATININE 1.75 (H) 01/13/2023   CREATININE 1.67 (H) 01/12/2023   CREATININE 1.71 (H) 01/10/2023

## 2023-01-12 NOTE — Assessment & Plan Note (Addendum)
Patient has a history of HFpEF with last EF of 60-65%, currently on Lasix 80 mg 3 times a day.  Despite this, he continues to have 2+ pitting edema up to the knees.  No pulmonary rales or JVD.  He has received IV fluids in the ED due to active infection.  Will hold off on further fluids at this time and monitor fluid status carefully.  - BNP 222.7 - Hold off Lasix - Strict in and out - Daily weights

## 2023-01-12 NOTE — ED Provider Notes (Signed)
[facility] Provider Note    Event Date/Time   First MD Initiated Contact with Patient 01/12/23 1048     (approximate)   History   Abscess   HPI  Harold Crosby is a 52 y.o. male with a past medical history of HIV on antiretroviral therapy, type 2 diabetes who presents today for evaluation of.  Patient reports that this has been there for approximately 1 week.  He is also concerned that he has sepsis given that he has had sepsis in relation to an abscess before.  He reports that he has chills over the last couple of days.  He has been taking Tylenol.  No abdominal pain.  No nausea or vomiting.  He denies testicular pain or swelling.  Patient Active Problem List   Diagnosis Date Noted   Perineal abscess 01/12/2023   (HFpEF) heart failure with preserved ejection fraction (HCC) 01/12/2023   Apnea 01/12/2023   Vaccine counseling 11/11/2021   CKD stage 3 secondary to diabetes (HCC) 09/29/2016   Leg pain, bilateral 07/13/2016   Bilateral leg edema 07/13/2016   Nocturia more than twice per night 08/12/2014   TOBACCO USER 06/02/2009   Moderate nonproliferative diabetic retinopathy (HCC) 04/21/2009   Hyperlipidemia 01/05/2009   Essential hypertension 07/08/2007   FURUNCLE 05/31/2007   INSOMNIA 05/31/2007   Human immunodeficiency virus disease (HCC) 05/31/2007   Type 2 diabetes mellitus without complications (HCC) 05/17/2007          Physical Exam   Triage Vital Signs: ED Triage Vitals  Enc Vitals Group     BP 01/12/23 1029 (!) 152/61     Pulse Rate 01/12/23 1029 (!) 105     Resp 01/12/23 1029 18     Temp 01/12/23 1029 98.2 F (36.8 C)     Temp Source 01/12/23 1029 Oral     SpO2 01/12/23 1029 96 %     Weight 01/12/23 1047 239 lb 13.8 oz (108.8 kg)     Height 01/12/23 1047 6' (1.829 m)     Head Circumference --      Peak Flow --      Pain Score 01/12/23 1027 7     Pain Loc --      Pain Edu? --      Excl. in GC? --     Most recent vital  signs: Vitals:   01/12/23 1029  BP: (!) 152/61  Pulse: (!) 105  Resp: 18  Temp: 98.2 F (36.8 C)  SpO2: 96%    Physical Exam Vitals and nursing note reviewed.  Constitutional:      General: Awake and alert. No acute distress.    Appearance: Normal appearance. The patient is normal weight.  HENT:     Head: Normocephalic and atraumatic.     Mouth: Mucous membranes are moist.  Eyes:     General: PERRL. Normal EOMs        Right eye: No discharge.        Left eye: No discharge.     Conjunctiva/sclera: Conjunctivae normal.  Cardiovascular:     Rate and Rhythm: Normal rate and regular rhythm.     Pulses: Normal pulses.  Pulmonary:     Effort: Pulmonary effort is normal. No respiratory distress.     Breath sounds: Normal breath sounds.  Abdominal:     Abdomen is soft. There is no abdominal tenderness. No rebound or guarding. No distention. Rectal: 3 x 1 cm area of swelling and tenderness to the  perineum extending from the inferior portion of the anus and extending to the base of the scrotum with purulence.  There is no scrotal or testicular involvement.  No crepitus Musculoskeletal:        General: No swelling. Normal range of motion.     Cervical back: Normal range of motion and neck supple.  Skin:    General: Skin is warm and dry.     Capillary Refill: Capillary refill takes less than 2 seconds.     Findings: No rash.  Neurological:     Mental Status: The patient is awake and alert.      ED Results / Procedures / Treatments   Labs (all labs ordered are listed, but only abnormal results are displayed) Labs Reviewed  CBC WITH DIFFERENTIAL/PLATELET - Abnormal; Notable for the following components:      Result Value   Neutro Abs 8.0 (*)    Abs Immature Granulocytes 0.08 (*)    All other components within normal limits  BASIC METABOLIC PANEL - Abnormal; Notable for the following components:   Sodium 134 (*)    Potassium 3.2 (*)    Glucose, Bld 254 (*)    Creatinine,  Ser 1.67 (*)    Calcium 8.6 (*)    GFR, Estimated 49 (*)    All other components within normal limits  LACTIC ACID, PLASMA - Abnormal; Notable for the following components:   Lactic Acid, Venous 3.2 (*)    All other components within normal limits  LACTIC ACID, PLASMA - Abnormal; Notable for the following components:   Lactic Acid, Venous 3.1 (*)    All other components within normal limits  URINALYSIS, ROUTINE W REFLEX MICROSCOPIC - Abnormal; Notable for the following components:   Color, Urine STRAW (*)    APPearance CLEAR (*)    Glucose, UA >=500 (*)    Hgb urine dipstick SMALL (*)    Protein, ur 100 (*)    All other components within normal limits  CULTURE, BLOOD (ROUTINE X 2)  CULTURE, BLOOD (ROUTINE X 2)  BRAIN NATRIURETIC PEPTIDE  HEMOGLOBIN A1C     EKG     RADIOLOGY I independently reviewed and interpreted imaging and agree with radiologists findings.     PROCEDURES:  Critical Care performed:   Procedures   MEDICATIONS ORDERED IN ED: Medications  vancomycin (VANCOREADY) IVPB 2000 mg/400 mL (2,000 mg Intravenous New Bag/Given 01/12/23 1450)  sodium chloride flush (NS) 0.9 % injection 3 mL (3 mLs Intravenous Given 01/12/23 1458)  acetaminophen (TYLENOL) tablet 650 mg (has no administration in time range)    Or  acetaminophen (TYLENOL) suppository 650 mg (has no administration in time range)  HYDROcodone-acetaminophen (NORCO/VICODIN) 5-325 MG per tablet 1-2 tablet (1 tablet Oral Given 01/12/23 1456)  HYDROmorphone (DILAUDID) injection 0.5-1 mg (has no administration in time range)  polyethylene glycol (MIRALAX / GLYCOLAX) packet 17 g (has no administration in time range)  multivitamin with minerals tablet 1 tablet (has no administration in time range)  ondansetron (ZOFRAN) tablet 4 mg (has no administration in time range)    Or  ondansetron (ZOFRAN) injection 4 mg (has no administration in time range)  insulin glargine-yfgn (SEMGLEE) injection 30 Units (has  no administration in time range)  insulin aspart (novoLOG) injection 0-20 Units (has no administration in time range)  metroNIDAZOLE (FLAGYL) IVPB 500 mg (has no administration in time range)  sodium chloride 0.9 % bolus 1,000 mL (0 mLs Intravenous Stopped 01/12/23 1440)  morphine (PF) 4 MG/ML injection 4  mg (4 mg Intravenous Given 01/12/23 1132)  iohexol (OMNIPAQUE) 300 MG/ML solution 100 mL (100 mLs Intravenous Contrast Given 01/12/23 1224)  piperacillin-tazobactam (ZOSYN) IVPB 3.375 g (0 g Intravenous Stopped 01/12/23 1415)     IMPRESSION / MDM / ASSESSMENT AND PLAN / ED COURSE  I reviewed the triage vital signs and the nursing notes.   Differential diagnosis includes, but is not limited to, perianal abscess, perirectal abscess, cellulitis, DKA, less likely Fournier's gangrene.  Patient is awake and alert, tachycardic to 105 on arrival, though normotensive and afebrile.  He has several factors that make him immunocompromised including HIV and diabetes.  Further workup is indicated.  He was placed on the cardiac monitor.  Labs obtained reveal no leukocytosis.  He has hyperglycemia without increased anion gap, normal bicarb, not consistent with DKA.  CT scan of his pelvis was obtained to determine the extent of the abscess.  He was treated symptomatically with normal saline and morphine with good effect.  Also initiated broad-spectrum antibiotics given that he is at high risk for sepsis and worsening disease given his immunocompromised status.  CT scan reveals inflammatory stranding with thickening and ill-defined fluid along the low left perineum and medial along the gluteal cleft margin along the inguinal crease into the base of the scrotum.  There is no soft tissue gas or well-defined fluid collection.  These findings were discussed with Dr. Dahlia Byes with general surgery who recommends admission to the hospitalist service, continuation of broad-spectrum antibiotics, and he will consult.  Patient was  accepted by Dr. Charleen Kirks.   Patient's presentation is most consistent with acute presentation with potential threat to life or bodily function.   Clinical Course as of 01/12/23 1501  Fri Jan 12, 2023  1331 Discussed with Dr. Dahlia Byes who agrees with broad-spectrum antibiotics (vanc/zosyn) and admission to the hospitalist service, and he will consult [JP]    Clinical Course User Index [JP] Keiffer Piper, Clarnce Flock, PA-C     FINAL CLINICAL IMPRESSION(S) / ED DIAGNOSES   Final diagnoses:  Perineal abscess  Hyperglycemia  Lactic acidosis     Rx / DC Orders   ED Discharge Orders     None        Note:  This document was prepared using Dragon voice recognition software and may include unintentional dictation errors.   Emeline Gins 01/12/23 1501    Merlyn Lot, MD 01/12/23 [phone]

## 2023-01-12 NOTE — Assessment & Plan Note (Addendum)
Patient has a history of apnea at home that has been witnessed by family.  High risk for OSA but no formal sleep study.  - Continuous pulse oximetry.

## 2023-01-12 NOTE — Assessment & Plan Note (Addendum)
Patient presenting with 2-week history of perirectal pain, erythema, drainage, rigors, nausea and vomiting.  CT imaging with evidence of inflammatory stranding and thickening with ill-defined fluid along the left perineum.  The area of inflammation extends into the base of the scrotum.    No evidence of sepsis at this time, however patient is certainly at high risk.  - General surgery seen and not planning any debridement today - N.p.o. after midnight per surgery if in case he needs any debridement - Continue vancomycin - Continue cefepime, vancomycin and Flagyl - Blood cultures pending

## 2023-01-12 NOTE — Assessment & Plan Note (Addendum)
History of uncontrolled diabetes with last A1c over 14% in 2022.  Patient is on a combination of short and long-acting insulin, but cannot recall the names.  - A1c 10.3 - Continue SSI, resistant scale - Continue Semglee 30 units at bedtime - Hold home medications

## 2023-01-12 NOTE — Consult Note (Signed)
Pharmacy Antibiotic Note  Harold Crosby is a 52 y.o. male admitted on 01/12/2023 with  perirectal abscess .  Pharmacy has been consulted for Vancomycin and Cefepime dosing. Patient also receiving Metronidazole 500mg  Q12 hours.  Of note, patient with CKD3, baseline SCR~1.2s in November 2022(likely has worsened over past 1+ year), unclear baseline currently  Plan: Vancomycin 2g IV x 1 as loading dose, followed by Vancomycin 1500 mg IV Q 24 hrs. Goal AUC 400-550. Expected AUC: 540.6 Expected Css: 12.4 SCr used: 1.67  Cefepime 2g IV Q8 hours   Height: 6' (182.9 cm) Weight: 108.8 kg (239 lb 13.8 oz) IBW/kg (Calculated) : 77.6  Temp (24hrs), Avg:98.2 F (36.8 C), Min:98.2 F (36.8 C), Max:98.2 F (36.8 C)  Recent Labs  Lab 01/10/23 1436 01/12/23 1102 01/12/23 1301  WBC 10.9* 9.5  --   CREATININE 1.71* 1.67*  --   LATICACIDVEN  --  3.2* 3.1*    Estimated Creatinine Clearance: 65.2 mL/min (A) (by C-G formula based on SCr of 1.67 mg/dL (H)).    Allergies  Allergen Reactions   Sulfonamide Derivatives Anaphylaxis    REACTION: skin welps, tongue swells, throat closes up    Antimicrobials this admission: Vanc 1/19 >>  Cefepime 1/19 >>  Flagyl 1/19 >> Zosyn 1/19 x 1  Dose adjustments this admission: N/A  Microbiology results: 1/19 BCx: pending   Thank you for allowing pharmacy to be a part of this patient's care.  Cobey Raineri A Rhonda Linan 01/12/2023 3:11 PM

## 2023-01-12 NOTE — Consult Note (Signed)
PHARMACY -  BRIEF ANTIBIOTIC NOTE   Pharmacy has received consult(s) for Vancomcyin from an ED provider.  The patient's profile has been reviewed for ht/wt/allergies/indication/available labs.    One time order(s) placed for Vancomycin 2g IV x 1 dose.  Further antibiotics/pharmacy consults should be ordered by admitting physician if indicated.                       Thank you, Pearla Dubonnet 01/12/2023  1:20 PM

## 2023-01-12 NOTE — ED Notes (Signed)
Pt transported to room via stretcher. Stable at time of departure.

## 2023-01-12 NOTE — Consult Note (Signed)
Patient ID: [name], male   DOB: 1971-08-18, 52 y.o.   MRN: [id number]  HPI Harold Crosby is a 52 y.o. male seen in consultation at the request of Mrs.Poggi PA-C for perineal infection.  The patient has a long history of HIV and poorly controlled diabetes, CKD,HFpEF with last EF of 60-65%  .  Over the last couple weeks he reports some swelling as well as intermittent pain in the perianal area.  The pain is moderate intensity worsening with certain movement and sharp.  He denies any drainage. He does Have a history of perianal infection back in 2016 and was treated with I&D under local anesthetic.  Dr[facility] Rosendo Gros at [facility] was his surgeon.  Harold Crosby also states that he has been having progressively worsening dyspnea on exertion and occasional shortness of breath at rest. In addition, his lower extremity swelling has worsened despite compliance with Lasix 80 mg 3 times daily.  WBC of 9.5, potassium 3.2, glucose 254, creatinine 1.67, GFR 49. Initial lactic acid 3.2. CT of the pelvis was obtained personally reviewed that demonstrated inflammatory stranding with thickening and ill-defined fluid along the low left perineum caudal to the anal canal immediately along the gluteal cleft extending to the inguinal crease and base of the scrotum.  Is no evidence of necrotizing infection and there is no evidence of defined abscess HPI  Past Medical History:  Diagnosis Date   History of thrush    HIV (human immunodeficiency virus infection) ([place])    Followed by Dr. Johnnye Sima in [place] [facility].    Hyperlipidemia    Hypertension    Insomnia    Tobacco abuse    Type II diabetes mellitus ([place])     History reviewed. No pertinent surgical history.  Family History  Problem Relation Age of Onset   Coronary artery disease Other    Diabetes Other    Kidney failure Mother     Social History Social History   Tobacco Use   Smoking status: Every Day    Packs/day: 1.00    Years: 20.00    Total pack years: 20.00     Types: Cigarettes   Smokeless tobacco: Never   Tobacco comments:    3-4 cigarettes a day   Substance Use Topics   Alcohol use: No   Drug use: No    Allergies  Allergen Reactions   Sulfonamide Derivatives Anaphylaxis    REACTION: skin welps, tongue swells, throat closes up    Current Facility-Administered Medications  Medication Dose Route Frequency Provider Last Rate Last Admin   acetaminophen (TYLENOL) tablet 650 mg  650 mg Oral Q6H PRN Jose Persia, MD       Or   acetaminophen (TYLENOL) suppository 650 mg  650 mg Rectal Q6H PRN Jose Persia, MD       ceFEPIme (MAXIPIME) 2 g in sodium chloride 0.9 % 100 mL IVPB  2 g Intravenous Q8H Nazari, Walid A, RPH       HYDROcodone-acetaminophen (NORCO/VICODIN) 5-325 MG per tablet 1-2 tablet  1-2 tablet Oral Q4H PRN Jose Persia, MD   1 tablet at 01/12/23 1456   HYDROmorphone (DILAUDID) injection 0.5-1 mg  0.5-1 mg Intravenous Q2H PRN Jose Persia, MD       insulin aspart (novoLOG) injection 0-20 Units  0-20 Units Subcutaneous TID WC Jose Persia, MD       insulin glargine-yfgn (SEMGLEE) injection 30 Units  30 Units Subcutaneous QHS Jose Persia, MD       metroNIDAZOLE (FLAGYL)  IVPB 500 mg  500 mg Intravenous Q12H Verdene Lennert, MD       multivitamin with minerals tablet 1 tablet  1 tablet Oral Daily Verdene Lennert, MD   1 tablet at 01/12/23 1545   ondansetron (ZOFRAN) tablet 4 mg  4 mg Oral Q6H PRN Verdene Lennert, MD       Or   ondansetron (ZOFRAN) injection 4 mg  4 mg Intravenous Q6H PRN Verdene Lennert, MD       polyethylene glycol (MIRALAX / GLYCOLAX) packet 17 g  17 g Oral Daily PRN Verdene Lennert, MD       sodium chloride flush (NS) 0.9 % injection 3 mL  3 mL Intravenous Q12H Verdene Lennert, MD   3 mL at 01/12/23 1458   [START ON 01/13/2023] vancomycin (VANCOREADY) IVPB 1500 mg/300 mL  1,500 mg Intravenous Q24H Nazari, Walid A, RPH       vancomycin (VANCOREADY) IVPB 2000 mg/400 mL  2,000 mg Intravenous Once  Mila Merry A, RPH 200 mL/hr at 01/12/23 1450 2,000 mg at 01/12/23 1450   Current Outpatient Medications  Medication Sig Dispense Refill   allopurinol (ZYLOPRIM) 100 MG tablet Take 100 mg by mouth daily.     celecoxib (CELEBREX) 100 MG capsule Take 100 mg by mouth 2 (two) times daily as needed.     colchicine 0.6 MG tablet Take 0.6 mg by mouth as needed.     finasteride (PROSCAR) 5 MG tablet Take 5 mg by mouth daily.     furosemide (LASIX) 40 MG tablet Take 80 mg by mouth 3 (three) times daily.     gabapentin (NEURONTIN) 600 MG tablet Take 1,200 mg by mouth 3 (three) times daily.     HUMULIN R U-500 KWIKPEN 500 UNIT/ML KwikPen Inject 100-200 Units into the skin 3 (three) times daily with meals.     insulin glargine (LANTUS) 100 UNIT/ML injection Inject 0.45 mLs (45 Units total) into the skin 2 (two) times daily. 15 mL 11   JARDIANCE 25 MG TABS tablet Take 25 mg by mouth daily.     metFORMIN (GLUCOPHAGE) 1000 MG tablet Take 1 tablet (1,000 mg total) by mouth 2 (two) times daily with a meal. 60 tablet 2   ODEFSEY 200-25-25 MG TABS tablet TAKE 1 TABLET BY MOUTH EVERY DAY WITH BREAKFAST 30 tablet 3   rosuvastatin (CRESTOR) 20 MG tablet Take 1 tablet by mouth daily.     tiZANidine (ZANAFLEX) 4 MG tablet Take 4 mg by mouth 3 (three) times daily.     insulin NPH Human (HUMULIN N,NOVOLIN N) 100 UNIT/ML injection Inject 95 Units into the skin 2 (two) times daily before a meal.  (Patient not taking: Reported on 01/12/2023)     lisinopril-hydrochlorothiazide (ZESTORETIC) 20-25 MG tablet TAKE 1 TABLET BY MOUTH EVERY DAY (Patient not taking: Reported on 11/11/2021) 30 tablet 1   Meloxicam 10 MG CAPS Take 1 capsule by mouth daily. (Patient not taking: Reported on 01/12/2023)     valACYclovir (VALTREX) 1000 MG tablet Take 1,000 mg by mouth 3 (three) times daily. (Patient not taking: Reported on 01/12/2023)     Facility-Administered Medications Ordered in Other Encounters  Medication Dose Route Frequency  Provider Last Rate Last Admin   cyanocobalamin ((VITAMIN B-12)) injection 1,000 mcg  1,000 mcg Intramuscular Once Ginnie Smart, MD         Review of Systems Full ROS  was asked and was negative except for the information on the HPI  Physical Exam Blood pressure Marland Kitchen)  152/61, pulse (!) 105, temperature 98.2 F (36.8 C), temperature source Oral, resp. rate 18, height 6' (1.829 m), weight 108.8 kg, SpO2 96 %. CONSTITUTIONAL: NAD obese. EYES: Pupils are equal, round, and reactive to light, Sclera are non-icteric. EARS, NOSE, MOUTH AND THROAT: The oropharynx is clear. The oral mucosa is pink and moist. Hearing is intact to voice. LYMPH NODES:  Lymph nodes in the neck are normal. RESPIRATORY:  Lungs are clear. There is normal respiratory effort, with equal breath sounds bilaterally, and without pathologic use of accessory muscles. CARDIOVASCULAR: Heart is regular without murmurs, gallops, or rubs. GI: The abdomen is  soft, nontender, and nondistended. There are no palpable masses. There is no hepatosplenomegaly. There are normal bowel sounds  Rectal; there is evidence of induration in the perineal area from posterior midline tracking to the left side.  There is no evidence of necrotizing infection.  There is no evidence of fluctuance.  There is erythema and tenderness.   MUSCULOSKELETAL: Normal muscle strength and tone. No cyanosis or edema.   SKIN: Turgor is good and there are no pathologic skin lesions or ulcers. NEUROLOGIC: Motor and sensation is grossly normal. Cranial nerves are grossly intact. PSYCH:  Oriented to person, place and time. Affect is normal.  Data Reviewed  I have personally reviewed the patient's imaging, laboratory findings and medical records.    Assessment/Plan 52 year old male with multiple risk factor to include HIV uncontrolled diabetes presents with perianal soft tissue infection.  At this time it is not an organized abscess that we will need surgical  intervention but it may turn into 1.  Recommend continuation of broad-spectrum antibiotics, optimization of diabetes and appropriate fluid resuscitation.  I will continue to follow him and if he gets more fluctuance we will proceed with exam under anesthesia and formal drainage in the OR.  There is no evidence of necrotizing soft tissue infection.  I will continue to follow him closely.  Please note that I have spent 75 minutes in this encounter including personally reviewing imaging studies, coordinating his care, placing orders, counseling the patient and performing appropriate documentation  Caroleen Hamman, MD FACS General Surgeon 01/12/2023, 4:18 PM

## 2023-01-13 DIAGNOSIS — L02215 Cutaneous abscess of perineum: Secondary | ICD-10-CM | POA: Diagnosis not present

## 2023-01-13 DIAGNOSIS — R0681 Apnea, not elsewhere classified: Secondary | ICD-10-CM | POA: Diagnosis not present

## 2023-01-13 DIAGNOSIS — E1122 Type 2 diabetes mellitus with diabetic chronic kidney disease: Secondary | ICD-10-CM | POA: Diagnosis not present

## 2023-01-13 DIAGNOSIS — E119 Type 2 diabetes mellitus without complications: Secondary | ICD-10-CM | POA: Diagnosis not present

## 2023-01-13 LAB — GLUCOSE, CAPILLARY
Glucose-Capillary: 223 mg/dL — ABNORMAL HIGH (ref 70–99)
Glucose-Capillary: 205 mg/dL — ABNORMAL HIGH (ref 70–99)
Glucose-Capillary: 288 mg/dL — ABNORMAL HIGH (ref 70–99)
Glucose-Capillary: 542 mg/dL (ref 70–99)
Glucose-Capillary: 395 mg/dL — ABNORMAL HIGH (ref 70–99)

## 2023-01-13 LAB — CBC WITH DIFFERENTIAL/PLATELET
Abs Immature Granulocytes: 0.14 10*3/uL — ABNORMAL HIGH (ref 0.00–0.07)
Basophils Absolute: 0 10*3/uL (ref 0.0–0.1)
Basophils Relative: 0 %
Eosinophils Absolute: 0 10*3/uL (ref 0.0–0.5)
Eosinophils Relative: 0 %
HCT: 41.5 % (ref 39.0–52.0)
Hemoglobin: 13.3 g/dL (ref 13.0–17.0)
Immature Granulocytes: 1 %
Lymphocytes Relative: 8 %
Lymphs Abs: 0.9 10*3/uL (ref 0.7–4.0)
MCH: 31.9 pg (ref 26.0–34.0)
MCHC: 32 g/dL (ref 30.0–36.0)
MCV: 99.5 fL (ref 80.0–100.0)
Monocytes Absolute: 1.4 10*3/uL — ABNORMAL HIGH (ref 0.1–1.0)
Monocytes Relative: 12 %
Neutro Abs: 8.8 10*3/uL — ABNORMAL HIGH (ref 1.7–7.7)
Neutrophils Relative %: 79 %
Platelets: 184 10*3/uL (ref 150–400)
RBC: 4.17 MIL/uL — ABNORMAL LOW (ref 4.22–5.81)
RDW: 13.6 % (ref 11.5–15.5)
WBC: 11.3 10*3/uL — ABNORMAL HIGH (ref 4.0–10.5)
nRBC: 0 % (ref 0.0–0.2)

## 2023-01-13 LAB — BASIC METABOLIC PANEL
Anion gap: 12 (ref 5–15)
BUN: 20 mg/dL (ref 6–20)
CO2: 24 mmol/L (ref 22–32)
Calcium: 8.5 mg/dL — ABNORMAL LOW (ref 8.9–10.3)
Chloride: 100 mmol/L (ref 98–111)
Creatinine, Ser: 1.75 mg/dL — ABNORMAL HIGH (ref 0.61–1.24)
GFR, Estimated: 46 mL/min — ABNORMAL LOW (ref >60)
Glucose, Bld: 212 mg/dL — ABNORMAL HIGH (ref 70–99)
Potassium: 3.5 mmol/L (ref 3.5–5.1)
Sodium: 136 mmol/L (ref 135–145)

## 2023-01-13 LAB — MAGNESIUM: Magnesium: 2.4 mg/dL (ref 1.7–2.4)

## 2023-01-13 MED ORDER — INSULIN ASPART 100 UNIT/ML IJ SOLN
0.0000 [IU] | Freq: Every day | INTRAMUSCULAR | Status: DC
  Administered 2023-01-13: 5 [IU] via SUBCUTANEOUS
  Administered 2023-01-14: 2 [IU] via SUBCUTANEOUS
  Administered 2023-01-15: 4 [IU] via SUBCUTANEOUS
  Filled 2023-01-13 (×3): qty 1

## 2023-01-13 MED ORDER — ALBUMIN HUMAN 25 % IV SOLN
25.0000 g | Freq: Once | INTRAVENOUS | Status: AC
  Administered 2023-01-13: 25 g via INTRAVENOUS
  Filled 2023-01-13: qty 100

## 2023-01-13 MED ORDER — INSULIN ASPART 100 UNIT/ML IJ SOLN: 0.0000 [IU] | Freq: Three times a day (TID) | INTRAMUSCULAR | Status: DC

## 2023-01-13 MED ORDER — INSULIN GLARGINE-YFGN 100 UNIT/ML ~~LOC~~ SOLN
40.0000 [IU] | Freq: Every day | SUBCUTANEOUS | Status: DC
  Administered 2023-01-13: 40 [IU] via SUBCUTANEOUS
  Filled 2023-01-13 (×2): qty 0.4

## 2023-01-13 MED ORDER — INSULIN ASPART 100 UNIT/ML IJ SOLN
5.0000 [IU] | Freq: Three times a day (TID) | INTRAMUSCULAR | Status: DC
  Administered 2023-01-13 – 2023-01-15 (×5): 5 [IU] via SUBCUTANEOUS
  Filled 2023-01-13 (×6): qty 1

## 2023-01-13 NOTE — Hospital Course (Addendum)
52 y.o. male with medical history significant of uncontrolled type 2 diabetes, CKD stage IIIb, HIV, hypertension, HFpEF with last EF of 60-65%, admitted for perirectal abscess   1/20: No need for debridement yet per surgical team, continue antibiotics 1/21: I&D  1/22: Adjusted insulin regimen for better blood sugar control per diabetic nurse

## 2023-01-13 NOTE — Progress Notes (Signed)
CC: perineal cellulitis/phlegmon Subjective: Feeling better, some pain but improved   Objective: Vital signs in last 24 hours: Temp:  [98.2 F (36.8 C)-102.6 F (39.2 C)] 98.2 F (36.8 C) (01/20 1606) Pulse Rate:  [97-111] 104 (01/20 1606) Resp:  [17-24] 17 (01/20 1606) BP: (109-163)/(53-86) 163/72 (01/20 1606) SpO2:  [92 %-98 %] 98 % (01/20 1606) Weight:  [113.2 kg] 113.2 kg (01/20 0449)    Intake/Output from previous day: 01/19 0701 - 01/20 0700 In: 1812.4 [P.O.:120; IV Piggyback:1692.4] Out: 0  Intake/Output this shift: Total I/O In: 397.3 [IV Piggyback:397.3] Out: -   Physical exam: GI: The abdomen is  soft, nontender, and nondistended. There are no palpable masses. There is no hepatosplenomegaly. There are normal bowel sounds  Rectal; there is evidence of induration in the perineal area from posterior midline tracking to the left side.  There is no evidence of necrotizing infection.  There is no evidence of fluctuance.  There is erythema and tenderness. No major changes as compared to yesterday exam   MUSCULOSKELETAL: Normal muscle strength and tone. No cyanosis or edema.   SKIN: Turgor is good and there are no pathologic skin lesions or ulcers. NEUROLOGIC: Motor and sensation is grossly normal. Cranial nerves are grossly intact. PSYCH:  Oriented to person, place and time. Affect is normal.   Lab Results: CBC  Recent Labs    01/12/23 1102 01/13/23 0443  WBC 9.5 11.3*  HGB 13.6 13.3  HCT 41.9 41.5  PLT 174 184   BMET Recent Labs    01/12/23 1102 01/13/23 0443  NA 134* 136  K 3.2* 3.5  CL 98 100  CO2 25 24  GLUCOSE 254* 212*  BUN 17 20  CREATININE 1.67* 1.75*  CALCIUM 8.6* 8.5*   PT/INR No results for input(s): "LABPROT", "INR" in the last 72 hours. ABG No results for input(s): "PHART", "HCO3" in the last 72 hours.  Invalid input(s): "PCO2", "PO2"  Studies/Results: CT PELVIS W CONTRAST  Result Date: 01/12/2023 CLINICAL DATA:  Perianal fistula  with abscess.  Present for 2 weeks. EXAM: CT PELVIS WITH CONTRAST TECHNIQUE: Multidetector CT imaging of the pelvis was performed using the standard protocol following the bolus administration of intravenous contrast. RADIATION DOSE REDUCTION: This exam was performed according to the departmental dose-optimization program which includes automated exposure control, adjustment of the mA and/or kV according to patient size and/or use of iterative reconstruction technique. CONTRAST:  131mL OMNIPAQUE IOHEXOL 300 MG/ML  SOLN COMPARISON:  CT 11/21/2015 abdomen and pelvis FINDINGS: Urinary Tract: Preserved contours of the urinary bladder. The distal ureters are nondilated. Prominent perinephric stranding noted along the lower retroperitoneum at the edge of the imaging field. Nonspecific. Bowel: The large bowel included in the pelvis has a normal course and caliber. Scattered colonic stool. Normal caliber appendix seen at the edge of the imaging field in the right lower quadrant. Few colonic diverticula are identified. The small bowel visualized is also nondilated. Vascular/Lymphatic: Normal caliber visualized arterial and venous major vessels in the pelvis. Scattered vascular calcifications along the distal aorta and some of the iliac vessels. No specific abnormal lymph node enlargement present in the pelvis. There are some small nodes in the inguinal regions which are not pathologic by size criteria. Reproductive:  No mass or other significant abnormality Other: Mild anasarca with some skin thickening dorsally. Small bilateral fat containing inguinal hernias. There is some fluid along the scrotal margin with stranding extending along the perineum on both sides caudal to the anal region. There  is similar confluent fluid along the margin of the left gluteal cleft inferior anterior and medial. This area is seen without gas and is measured on series 3, image 12 measuring 2.6 by 1.3 cm. There is some tracking of inflammatory  changes anteriorly as well along the left side of the scrotal margin as seen on series 3, image 9. A distinct tract to the anal region is not well seen on this CT scan. If there is concern of a true anal fistula tract staging MRI for anal fistula can be performed when clinically appropriate. No clear fluid collections or stranding extending along the ischioanal fossa, above the pelvic floor musculature. Musculoskeletal: Surgical changes along the lower lumbar spine from laminectomies. Scattered degenerative changes are seen in the lower lumbar spine and scattered along the pelvis. IMPRESSION: Inflammatory stranding with thickening and ill-defined fluid along the low left perineum caudal to the anal canal and medially along the gluteal cleft margin with tracking of thickening and stranding anteriorly along the inguinal crease and base of the scrotum. No soft tissue gas or other well-defined fluid collections clearly identified in addition there is no clear tract extending to the anal canal. If there is further concern of than actual tract and further anatomy is desired, dedicated anal fistula protocol MRI can be performed as clinically indicated. Electronically Signed   By: Jill Side M.D.   On: 01/12/2023 12:51    Anti-infectives: Anti-infectives (From admission, onward)    Start     Dose/Rate Route Frequency Ordered Stop   01/13/23 1400  vancomycin (VANCOREADY) IVPB 1500 mg/300 mL        1,500 mg 150 mL/hr over 120 Minutes Intravenous Every 24 hours 01/12/23 1517     01/13/23 0800  emtricitabine-rilpivir-tenofovir AF (ODEFSEY) 200-25-25 MG per tablet 1 tablet        1 tablet Oral Daily with breakfast 01/12/23 1718     01/12/23 2200  ceFEPIme (MAXIPIME) 2 g in sodium chloride 0.9 % 100 mL IVPB        2 g 200 mL/hr over 30 Minutes Intravenous Every 8 hours 01/12/23 1517     01/12/23 1500  metroNIDAZOLE (FLAGYL) IVPB 500 mg        500 mg 100 mL/hr over 60 Minutes Intravenous Every 12 hours 01/12/23  1456     01/12/23 1330  vancomycin (VANCOREADY) IVPB 2000 mg/400 mL        2,000 mg 200 mL/hr over 120 Minutes Intravenous  Once 01/12/23 1320 01/12/23 1650   01/12/23 1315  piperacillin-tazobactam (ZOSYN) IVPB 3.375 g        3.375 g 100 mL/hr over 30 Minutes Intravenous  Once 01/12/23 1308 01/12/23 1415       Assessment/Plan:  perineal cellulitis/phlegmon severe responded to antibiotic therapy and not quite ready to drain.  Will continue to follow him as a precaution we will put him n.p.o. after midnight.  May need debridement tomorrow if the phlegmon matures into an abscess. Please note that I have spent 35 minutes in this encounter including personally reviewing imaging studies, coordinating his care, placing orders, counseling the patient and performing appropriate documentation     Caroleen Hamman, MD, FACS  01/13/2023

## 2023-01-13 NOTE — Progress Notes (Signed)
Progress Note   Patient: Harold Crosby IRS:[id number] DOB: 04-05-1971 DOA: 01/12/2023     1 DOS: the patient was seen and examined on 01/13/2023   Brief hospital course: 52 y.o. male with medical history significant of uncontrolled type 2 diabetes, CKD stage IIIb, HIV, hypertension, HFpEF with last EF of 60-65%, admitted for perirectal abscess   1/20: No need for debridement yet per surgical team, continue antibiotics  Assessment and Plan: * Perineal abscess Patient presenting with 2-week history of perirectal pain, erythema, drainage, rigors, nausea and vomiting.  CT imaging with evidence of inflammatory stranding and thickening with ill-defined fluid along the left perineum.  The area of inflammation extends into the base of the scrotum.    No evidence of sepsis at this time, however patient is certainly at high risk.  - General surgery seen and not planning any debridement today - N.p.o. after midnight per surgery if in case he needs any debridement - Continue vancomycin - Continue cefepime, vancomycin and Flagyl - Blood cultures pending  Type 2 diabetes mellitus without complications ([place]) History of uncontrolled diabetes with last A1c over 14% in 2022.  Patient is on a combination of short and long-acting insulin, but cannot recall the names.  - A1c 10.3 - Continue SSI, resistant scale - Continue Semglee 30 units at bedtime - Hold home medications  (HFpEF) heart failure with preserved ejection fraction ([place]) Patient has a history of HFpEF with last EF of 60-65%, currently on Lasix 80 mg 3 times a day.  Despite this, he continues to have 2+ pitting edema up to the knees.  No pulmonary rales or JVD.  He has received IV fluids in the ED due to active infection.  Will hold off on further fluids at this time and monitor fluid status carefully.  - BNP 222.7 - Hold off Lasix - Strict in and out - Daily weights  CKD stage 3 secondary to diabetes [facility]) Renal function currently at  baseline.  - Monitor BMP while receiving nephrotoxic agents  Lab Results  Component Value Date   CREATININE 1.75 (H) 01/13/2023   CREATININE 1.67 (H) 01/12/2023   CREATININE 1.71 (H) 01/10/2023     Apnea Patient has a history of apnea at home that has been witnessed by family.  High risk for OSA but no formal sleep study.  - Continuous pulse oximetry.        Subjective: Denies any pain or fever  Physical Exam: Vitals:   01/13/23 0449 01/13/23 0551 01/13/23 0651 01/13/23 0753  BP: (!) 124/53 (!) 149/64 (!) 109/55 125/68  Pulse: (!) 111 (!) 108 100 97  Resp: 20 20 20 17   Temp: (!) 102.6 F (39.2 C) 100 F (37.8 C) 99.2 F (37.3 C) 98.8 F (37.1 C)  TempSrc: Oral Oral Oral   SpO2: 94% 93% 94% 93%  Weight: 113.2 kg     Height:       52 year old obese male lying in the bed comfortably without any acute distress Lungs clear to auscultation bilaterally Heart regular rate and rhythm Abdomen soft, obese, benign Skin/GU: Large area of erythema and induration around the perirectal area extending towards the scrotum he does have central opening with purulent drainage and tenderness around Data Reviewed:  Creatinine 1.75  Family Communication: None  Disposition: Status is: Inpatient Remains inpatient appropriate because: Management of perirectal abscess  Planned Discharge Destination: Home   DVT prophylaxis-SCDs Time spent: 35 minutes  Author: Max Sane, MD 01/13/2023 2:21 PM  For on call  review [email or web address].

## 2023-01-13 NOTE — Plan of Care (Signed)
  Problem: Coping: Goal: Ability to adjust to condition or change in health will improve Outcome: Progressing   Problem: Fluid Volume: Goal: Ability to maintain a balanced intake and output will improve Outcome: Progressing   Problem: Health Behavior/Discharge Planning: Goal: Ability to manage health-related needs will improve Outcome: Progressing   Problem: Nutritional: Goal: Maintenance of adequate nutrition will improve Outcome: Progressing   Problem: Skin Integrity: Goal: Risk for impaired skin integrity will decrease Outcome: Progressing   Problem: Tissue Perfusion: Goal: Adequacy of tissue perfusion will improve Outcome: Progressing   Problem: Health Behavior/Discharge Planning: Goal: Ability to manage health-related needs will improve Outcome: Progressing   Problem: Clinical Measurements: Goal: Ability to maintain clinical measurements within normal limits will improve Outcome: Progressing Goal: Cardiovascular complication will be avoided Outcome: Progressing   Problem: Activity: Goal: Risk for activity intolerance will decrease Outcome: Progressing   Problem: Coping: Goal: Level of anxiety will decrease Outcome: Progressing   Problem: Elimination: Goal: Will not experience complications related to urinary retention Outcome: Progressing   Problem: Pain Managment: Goal: General experience of comfort will improve Outcome: Progressing   Problem: Safety: Goal: Ability to remain free from injury will improve Outcome: Progressing   Problem: Skin Integrity: Goal: Risk for impaired skin integrity will decrease Outcome: Progressing

## 2023-01-13 NOTE — Progress Notes (Signed)
   01/13/23 0455  Notify: Charge Nurse/RN  Name of Charge Nurse/RN Notified Cherlynn Perches  Date Charge Nurse/RN Notified 01/13/23  Time Charge Nurse/RN Notified [id number]  Provider Notification  Provider Name/Title Sharion Settler  Date Provider Notified 01/13/23  Time Provider Notified 0456  Method of Notification Page  Notification Reason Critical Result  Provider response No new orders  Date of Provider Response 01/13/23  Time of Provider Response [id number]  Notify: Rapid Response  Name of Rapid Response RN Notified Danae Chen, RN CCU  Date Rapid Response Notified 01/13/23  Time Rapid Response Notified 0456

## 2023-01-14 ENCOUNTER — Inpatient Hospital Stay: Payer: Medicare HMO | Admitting: Anesthesiology

## 2023-01-14 ENCOUNTER — Encounter
Admission: EM | Disposition: A | Discharge status: Home / Self Care | Payer: Self-pay | Source: Home / Self Care | Attending: Internal Medicine

## 2023-01-14 ENCOUNTER — Other Ambulatory Visit: Payer: Self-pay

## 2023-01-14 DIAGNOSIS — N493 Fournier gangrene: Secondary | ICD-10-CM

## 2023-01-14 DIAGNOSIS — E1122 Type 2 diabetes mellitus with diabetic chronic kidney disease: Secondary | ICD-10-CM | POA: Diagnosis not present

## 2023-01-14 DIAGNOSIS — E119 Type 2 diabetes mellitus without complications: Secondary | ICD-10-CM | POA: Diagnosis not present

## 2023-01-14 DIAGNOSIS — L02215 Cutaneous abscess of perineum: Secondary | ICD-10-CM | POA: Diagnosis not present

## 2023-01-14 DIAGNOSIS — I5030 Unspecified diastolic (congestive) heart failure: Secondary | ICD-10-CM | POA: Diagnosis not present

## 2023-01-14 HISTORY — PX: INCISION AND DRAINAGE ABSCESS: SHX5864

## 2023-01-14 LAB — BASIC METABOLIC PANEL
Anion gap: 13 (ref 5–15)
BUN: 27 mg/dL — ABNORMAL HIGH (ref 6–20)
CO2: 22 mmol/L (ref 22–32)
Calcium: 8.6 mg/dL — ABNORMAL LOW (ref 8.9–10.3)
Chloride: 99 mmol/L (ref 98–111)
Creatinine, Ser: 1.79 mg/dL — ABNORMAL HIGH (ref 0.61–1.24)
GFR, Estimated: 45 mL/min — ABNORMAL LOW (ref >60)
Glucose, Bld: 394 mg/dL — ABNORMAL HIGH (ref 70–99)
Potassium: 3.2 mmol/L — ABNORMAL LOW (ref 3.5–5.1)
Sodium: 134 mmol/L — ABNORMAL LOW (ref 135–145)

## 2023-01-14 LAB — CBC
HCT: 40.3 % (ref 39.0–52.0)
Hemoglobin: 13 g/dL (ref 13.0–17.0)
MCH: 31.6 pg (ref 26.0–34.0)
MCHC: 32.3 g/dL (ref 30.0–36.0)
MCV: 98.1 fL (ref 80.0–100.0)
Platelets: 225 10*3/uL (ref 150–400)
RBC: 4.11 MIL/uL — ABNORMAL LOW (ref 4.22–5.81)
RDW: 13.4 % (ref 11.5–15.5)
WBC: 11.4 10*3/uL — ABNORMAL HIGH (ref 4.0–10.5)
nRBC: 0 % (ref 0.0–0.2)

## 2023-01-14 LAB — GLUCOSE, CAPILLARY
Glucose-Capillary: 323 mg/dL — ABNORMAL HIGH (ref 70–99)
Glucose-Capillary: 422 mg/dL — ABNORMAL HIGH (ref 70–99)
Glucose-Capillary: 259 mg/dL — ABNORMAL HIGH (ref 70–99)
Glucose-Capillary: 212 mg/dL — ABNORMAL HIGH (ref 70–99)
Glucose-Capillary: 240 mg/dL — ABNORMAL HIGH (ref 70–99)

## 2023-01-14 LAB — MAGNESIUM: Magnesium: 2.3 mg/dL (ref 1.7–2.4)

## 2023-01-14 SURGERY — INCISION AND DRAINAGE, ABSCESS
Anesthesia: General | Site: Perineum

## 2023-01-14 MED ORDER — INSULIN ASPART 100 UNIT/ML IJ SOLN
INTRAMUSCULAR | Status: AC
  Filled 2023-01-14: qty 1

## 2023-01-14 MED ORDER — LIDOCAINE HCL (CARDIAC) PF 100 MG/5ML IV SOSY
PREFILLED_SYRINGE | INTRAVENOUS | Status: DC | PRN
  Administered 2023-01-14: 100 mg via INTRAVENOUS

## 2023-01-14 MED ORDER — OXYCODONE HCL 5 MG PO TABS
5.0000 mg | ORAL_TABLET | ORAL | Status: DC | PRN
  Administered 2023-01-14 – 2023-01-16 (×5): 5 mg via ORAL
  Filled 2023-01-14 (×5): qty 1

## 2023-01-14 MED ORDER — ACETAMINOPHEN 10 MG/ML IV SOLN: 1000.0000 mg | Freq: Once | INTRAVENOUS | Status: DC | PRN

## 2023-01-14 MED ORDER — DEXMEDETOMIDINE HCL IN NACL 80 MCG/20ML IV SOLN
INTRAVENOUS | Status: DC | PRN
  Administered 2023-01-14: 8 ug via BUCCAL

## 2023-01-14 MED ORDER — OXYCODONE HCL 5 MG/5ML PO SOLN: 5.0000 mg | Freq: Once | ORAL | Status: DC | PRN

## 2023-01-14 MED ORDER — POTASSIUM CHLORIDE CRYS ER 20 MEQ PO TBCR
40.0000 meq | EXTENDED_RELEASE_TABLET | Freq: Once | ORAL | Status: AC
  Administered 2023-01-14: 40 meq via ORAL
  Filled 2023-01-14: qty 2

## 2023-01-14 MED ORDER — INSULIN GLARGINE-YFGN 100 UNIT/ML ~~LOC~~ SOLN
50.0000 [IU] | Freq: Every day | SUBCUTANEOUS | Status: DC
  Administered 2023-01-14: 50 [IU] via SUBCUTANEOUS
  Filled 2023-01-14 (×3): qty 0.5

## 2023-01-14 MED ORDER — POTASSIUM CHLORIDE CRYS ER 20 MEQ PO TBCR: 40.0000 meq | EXTENDED_RELEASE_TABLET | Freq: Once | ORAL | Status: DC

## 2023-01-14 MED ORDER — PROPOFOL 10 MG/ML IV BOLUS
INTRAVENOUS | Status: DC | PRN
  Administered 2023-01-14: 150 mg via INTRAVENOUS

## 2023-01-14 MED ORDER — SUCCINYLCHOLINE CHLORIDE 200 MG/10ML IV SOSY
PREFILLED_SYRINGE | INTRAVENOUS | Status: DC | PRN
  Administered 2023-01-14: 120 mg via INTRAVENOUS

## 2023-01-14 MED ORDER — ONDANSETRON HCL 4 MG/2ML IJ SOLN: 4.0000 mg | Freq: Once | INTRAMUSCULAR | Status: DC | PRN

## 2023-01-14 MED ORDER — INSULIN ASPART 100 UNIT/ML IJ SOLN
30.0000 [IU] | Freq: Once | INTRAMUSCULAR | Status: AC
  Administered 2023-01-14: 30 [IU] via SUBCUTANEOUS
  Filled 2023-01-14: qty 1

## 2023-01-14 MED ORDER — INSULIN ASPART 100 UNIT/ML IJ SOLN
30.0000 [IU] | Freq: Once | INTRAMUSCULAR | Status: AC
  Administered 2023-01-14: 30 [IU] via SUBCUTANEOUS

## 2023-01-14 MED ORDER — ACETAMINOPHEN 10 MG/ML IV SOLN
INTRAVENOUS | Status: AC
  Administered 2023-01-14: 1000 mg via INTRAVENOUS
  Filled 2023-01-14: qty 100

## 2023-01-14 MED ORDER — ONDANSETRON HCL 4 MG/2ML IJ SOLN
INTRAMUSCULAR | Status: DC | PRN
  Administered 2023-01-14: 4 mg via INTRAVENOUS

## 2023-01-14 MED ORDER — PHENYLEPHRINE HCL (PRESSORS) 10 MG/ML IV SOLN
INTRAVENOUS | Status: DC | PRN
  Administered 2023-01-14 (×3): 160 ug via INTRAVENOUS

## 2023-01-14 MED ORDER — SODIUM CHLORIDE 0.9 % IV SOLN: INTRAVENOUS | Status: DC | PRN

## 2023-01-14 MED ORDER — OXYCODONE HCL 5 MG PO TABS: 5.0000 mg | ORAL_TABLET | Freq: Once | ORAL | Status: DC | PRN

## 2023-01-14 MED ORDER — FENTANYL CITRATE (PF) 100 MCG/2ML IJ SOLN: 25.0000 ug | INTRAMUSCULAR | Status: DC | PRN

## 2023-01-14 MED ORDER — FENTANYL CITRATE (PF) 100 MCG/2ML IJ SOLN
INTRAMUSCULAR | Status: DC | PRN
  Administered 2023-01-14: 100 ug via INTRAVENOUS
  Administered 2023-01-14: 50 ug via INTRAVENOUS

## 2023-01-14 MED ORDER — FENTANYL CITRATE (PF) 100 MCG/2ML IJ SOLN
INTRAMUSCULAR | Status: AC
  Filled 2023-01-14: qty 2

## 2023-01-14 MED ORDER — 0.9 % SODIUM CHLORIDE (POUR BTL) OPTIME
TOPICAL | Status: DC | PRN
  Administered 2023-01-14: 500 mL

## 2023-01-14 SURGICAL SUPPLY — 29 items
BLADE CLIPPER SURG (BLADE) ×1 IMPLANT
BLADE SURG 15 STRL LF DISP TIS (BLADE) ×1 IMPLANT
BLADE SURG 15 STRL SS (BLADE) ×1
BRUSH SCRUB EZ  4% CHG (MISCELLANEOUS) ×1
BRUSH SCRUB EZ 4% CHG (MISCELLANEOUS) ×1 IMPLANT
DRAPE LEGGINS SURG 28X43 STRL (DRAPES) ×1 IMPLANT
DRAPE UNDER BUTTOCK W/FLU (DRAPES) ×1 IMPLANT
ELECT CAUTERY BLADE 6.4 (BLADE) ×1 IMPLANT
ELECT REM PT RETURN 9FT ADLT (ELECTROSURGICAL) ×1
ELECTRODE REM PT RTRN 9FT ADLT (ELECTROSURGICAL) ×1 IMPLANT
GAUZE 4X4 16PLY ~~LOC~~+RFID DBL (SPONGE) ×1 IMPLANT
GAUZE PACKING FOLDED 1IN STRL (GAUZE/BANDAGES/DRESSINGS) IMPLANT
GLOVE BIO SURGEON STRL SZ7 (GLOVE) ×1 IMPLANT
GOWN STRL REUS W/ TWL LRG LVL3 (GOWN DISPOSABLE) ×2 IMPLANT
GOWN STRL REUS W/TWL LRG LVL3 (GOWN DISPOSABLE) ×2
MANIFOLD NEPTUNE II (INSTRUMENTS) ×1 IMPLANT
NEEDLE HYPO 22GX1.5 SAFETY (NEEDLE) ×1 IMPLANT
NS IRRIG 1000ML POUR BTL (IV SOLUTION) ×1 IMPLANT
PACK BASIN MINOR ARMC (MISCELLANEOUS) ×1 IMPLANT
PAD ABD DERMACEA PRESS 5X9 (GAUZE/BANDAGES/DRESSINGS) IMPLANT
PAD PREP 24X41 OB/GYN DISP (PERSONAL CARE ITEMS) ×1 IMPLANT
SOL PREP PVP 2OZ (MISCELLANEOUS) ×1
SOLUTION PREP PVP 2OZ (MISCELLANEOUS) ×1 IMPLANT
SPONGE T-LAP 18X18 ~~LOC~~+RFID (SPONGE) ×1 IMPLANT
SURGILUBE 2OZ TUBE FLIPTOP (MISCELLANEOUS) ×1 IMPLANT
SWAB CULTURE AMIES ANAERIB BLU (MISCELLANEOUS) ×1 IMPLANT
SYR 20ML LL LF (SYRINGE) ×1 IMPLANT
TRAP FLUID SMOKE EVACUATOR (MISCELLANEOUS) ×1 IMPLANT
WATER STERILE IRR 500ML POUR (IV SOLUTION) ×1 IMPLANT

## 2023-01-14 NOTE — Transfer of Care (Signed)
Immediate Anesthesia Transfer of Care Note  Patient: Harold Crosby  Procedure(s) Performed: INCISION AND DRAINAGE PERIRECTAL ABSCESS (Perineum)  Patient Location: PACU  Anesthesia Type:General  Level of Consciousness: awake, alert , and drowsy  Airway & Oxygen Therapy: Patient Spontanous Breathing and Patient connected to face mask oxygen  Post-op Assessment: Report given to RN and Post -op Vital signs reviewed and stable  Post vital signs: Reviewed and stable  Last Vitals:  Vitals Value Taken Time  BP 132/70 01/14/23 1548  Temp    Pulse 85 01/14/23 1548  Resp 19 01/14/23 1548  SpO2 95 % 01/14/23 1548    Last Pain:  Vitals:   01/14/23 1015  TempSrc:   PainSc: 0-No pain      Patients Stated Pain Goal: 2 (23/95/32 0233)  Complications:  Encounter Notable Events  Notable Event Outcome Phase Comment  Difficult to intubate - expected  Intraprocedure Filed from anesthesia note documentation.

## 2023-01-14 NOTE — Assessment & Plan Note (Signed)
Patient presenting with 2-week history of perirectal pain, erythema, drainage, rigors, nausea and vomiting.  CT imaging with evidence of inflammatory stranding and thickening with ill-defined fluid along the left perineum.  The area of inflammation extends into the base of the scrotum.    Sepsis ruled out  - I&D planned by surgery today - Continue cefepime, vancomycin and Flagyl - Blood cultures negative

## 2023-01-14 NOTE — Assessment & Plan Note (Signed)
Renal function currently at baseline.  - Monitor BMP while receiving nephrotoxic agents.  Lab Results  Component Value Date   CREATININE 1.79 (H) 01/14/2023   CREATININE 1.75 (H) 01/13/2023   CREATININE 1.67 (H) 01/12/2023

## 2023-01-14 NOTE — Assessment & Plan Note (Addendum)
History of uncontrolled diabetes with last A1c over 14% in 2022.  Patient is on a combination of short and long-acting insulin, but cannot recall the names.  - A1c 10.3 - Continue SSI, resistant scale - Increase insulin Semglee to 50 units at bedtime for better blood sugar control - Added insulin NovoLog 5 units subcu 3 times daily with each meals yesterday Will consult diabetic nurse

## 2023-01-14 NOTE — Progress Notes (Signed)
Seen and examined. Now abscess organized, we wll do I/d in the OR. Procedure d/wp in detail, Risks, benefits and possible complications. He understands

## 2023-01-14 NOTE — Anesthesia Postprocedure Evaluation (Signed)
Anesthesia Post Note  Patient: Harold Crosby  Procedure(s) Performed: INCISION AND DRAINAGE PERIRECTAL ABSCESS (Perineum)  Patient location during evaluation: PACU Anesthesia Type: General Level of consciousness: awake and alert Pain management: pain level controlled Vital Signs Assessment: post-procedure vital signs reviewed and stable Respiratory status: spontaneous breathing, nonlabored ventilation, respiratory function stable and patient connected to nasal cannula oxygen Cardiovascular status: blood pressure returned to baseline and stable Postop Assessment: no apparent nausea or vomiting Anesthetic complications: yes   Encounter Notable Events  Notable Event Outcome Phase Comment  Difficult to intubate - expected  Intraprocedure Filed from anesthesia note documentation.     Last Vitals:  Vitals:   01/14/23 1615 01/14/23 1625  BP: 137/89 (!) 143/87  Pulse: 84 86  Resp: (!) 22 14  Temp: (!) 36.2 C   SpO2: 96% 96%    Last Pain:  Vitals:   01/14/23 1625  TempSrc:   PainSc: 5                  Arita Miss

## 2023-01-14 NOTE — Op Note (Signed)
  01/14/2023  3:36 PM  PATIENT:  Francesca Oman  52 y.o. male  PRE-OPERATIVE DIAGNOSIS:  perineal  abscess  POST-OPERATIVE DIAGNOSIS:  perineal abscess extending into the scrotum   PROCEDURE:   Wide debridement of the perineum extending into the scrotum   SURGEON:  Surgeon(s) and Role:    * Kamsiyochukwu Buist, Marjory Lies, MD - Primary  ASSISTANTS: Prince Rome PA-S  ANESTHESIA: GETA  FINDINGS: Perineal abscess extending into the scrotum with areas of necrotizing infection c/w Foruneir's  DICTATION:  Patient was explained about the procedure in detail. Risks, benefits, possible complications and a consent was obtained. The patient taken to the operating room and placed in the lithotomy position. He was prepped and draped in the usual sterile fashion.  Exam revealed evidence of induration involving the anterior perineal area extending to the scrotum.  Area of maximal fluctuance was located on the left anterolateral area.  Incision with knife was created on pus was evacuated.  Cultures were obtained appropriately.  There was complex loculations that I was able to finger fracture them.  Excisional debridement of the skin and subcutaneous tissue to include the fascia was performed.  There was some areas of necrosis.  Please note that there was also evidence of DISH water fluid coming from the scrotal area.  The wound tracked into the scrotum and did not involve the testicles.  We were able to open up more cephalad to drain deposit within the scrotal space.  Adequate hemostasis was performed with cautery and the wound was irrigated profusely with normal saline. 1 inch contour dressing was placed to pack the wound. 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  Needle and laparotomy counts were correct and there were no immediate complications  Jules Husbands, MD

## 2023-01-14 NOTE — Anesthesia Procedure Notes (Addendum)
Procedure Name: Intubation Date/Time: 01/14/2023 2:59 PM  Performed by: Johnna Acosta, CRNAPre-anesthesia Checklist: Patient identified, Emergency Drugs available, Suction available, Patient being monitored and Timeout performed Patient Re-evaluated:Patient Re-evaluated prior to induction Oxygen Delivery Method: Circle system utilized Preoxygenation: Pre-oxygenation with 100% oxygen Induction Type: IV induction and Rapid sequence Laryngoscope Size: McGraph and 4 Grade View: Grade II Tube type: Oral Tube size: 7.0 mm Number of attempts: 2 Airway Equipment and Method: Stylet and Video-laryngoscopy Placement Confirmation: ETT inserted through vocal cords under direct vision, positive ETCO2 and breath sounds checked- equal and bilateral Secured at: 21 cm Tube secured with: Tape Dental Injury: Teeth and Oropharynx as per pre-operative assessment  Difficulty Due To: Difficulty was anticipated and Difficult Airway- due to anterior larynx Comments: First attempt by CRNA with Mcgrath 3 blade showing Grade 3 view. Second attempt by MD with Meredith Staggers 4 showing Grade 2b view with successful intubation.

## 2023-01-14 NOTE — Progress Notes (Signed)
Progress Note   Patient: Harold Crosby:[id number] DOB: 04/23/1971 DOA: 01/12/2023     2 DOS: the patient was seen and examined on 01/14/2023   Brief hospital course: 53 y.o. male with medical history significant of uncontrolled type 2 diabetes, CKD stage IIIb, HIV, hypertension, HFpEF with last EF of 60-65%, admitted for perirectal abscess   1/20: No need for debridement yet per surgical team, continue antibiotics 1/21: I&D today per surgery  Assessment and Plan: * Perineal abscess Patient presenting with 2-week history of perirectal pain, erythema, drainage, rigors, nausea and vomiting.  CT imaging with evidence of inflammatory stranding and thickening with ill-defined fluid along the left perineum.  The area of inflammation extends into the base of the scrotum.    Sepsis ruled out  - I&D planned by surgery today - Continue cefepime, vancomycin and Flagyl - Blood cultures negative  Type 2 diabetes mellitus without complications ([place]) History of uncontrolled diabetes with last A1c over 14% in 2022.  Patient is on a combination of short and long-acting insulin, but cannot recall the names.  - A1c 10.3 - Continue SSI, resistant scale - Increase insulin Semglee to 50 units at bedtime for better blood sugar control - Added insulin NovoLog 5 units subcu 3 times daily with each meals yesterday Will consult diabetic nurse  (HFpEF) heart failure with preserved ejection fraction ([place]) Patient has a history of HFpEF with last EF of 60-65%, currently on Lasix 80 mg 3 times a day.  Despite this, he continues to have 2+ pitting edema up to the knees.  No pulmonary rales or JVD.  He has received IV fluids in the ED due to active infection.  Will hold off on further fluids at this time and monitor fluid status carefully.  - BNP 222.7 - Restart Lasix as he is getting positive fluid balance now - Strict in and out - Daily weights. Net IO Since Admission: 2,545.18 mL [01/14/23 1457]   CKD stage  3 secondary to diabetes [facility]) Renal function currently at baseline.  - Monitor BMP while receiving nephrotoxic agents.  Lab Results  Component Value Date   CREATININE 1.79 (H) 01/14/2023   CREATININE 1.75 (H) 01/13/2023   CREATININE 1.67 (H) 01/12/2023     Apnea Patient has a history of apnea at home that has been witnessed by family.  High risk for OSA but no formal sleep study.  Consider sleep study as an outpatient         Subjective: Hoping to get I&D done today  Physical Exam: Vitals:   01/13/23 1606 01/13/23 2100 01/14/23 0523 01/14/23 0812  BP: (!) 163/72 (!) 169/71 (!) 153/69 (!) 162/84  Pulse: (!) 104 100 (!) 104 100  Resp: 17 18 20 17   Temp: 98.2 F (36.8 C) 98.4 F (36.9 C) 98.5 F (36.9 C) 98.2 F (36.8 C)  TempSrc:  Oral Oral   SpO2: 98% 99% 96% 93%  Weight:   109 kg   Height:       52 year old obese male lying in the bed comfortably without any acute distress Lungs clear to auscultation bilaterally Heart regular rate and rhythm Abdomen soft, obese, benign Skin/GU: Large area of erythema and induration around the perirectal area extending towards the scrotum he does have central opening with purulent drainage and tenderness around Data Reviewed:  Potassium 3.2  Family Communication: None  Disposition: Status is: Inpatient Remains inpatient appropriate because: Needs I&D of PERI rectal abscess today  Planned Discharge Destination: Home  DVT prophylaxis-SCDs Time spent: 35 minutes  Author: Max Sane, MD 01/14/2023 2:58 PM  For on call review [email or web address].

## 2023-01-14 NOTE — Assessment & Plan Note (Signed)
Patient has a history of HFpEF with last EF of 60-65%, currently on Lasix 80 mg 3 times a day.  Despite this, he continues to have 2+ pitting edema up to the knees.  No pulmonary rales or JVD.  He has received IV fluids in the ED due to active infection.  Will hold off on further fluids at this time and monitor fluid status carefully.  - BNP 222.7 - Restart Lasix as he is getting positive fluid balance now - Strict in and out - Daily weights. Net IO Since Admission: 2,545.18 mL [01/14/23 1457]

## 2023-01-14 NOTE — Assessment & Plan Note (Signed)
Patient has a history of apnea at home that has been witnessed by family.  High risk for OSA but no formal sleep study.  Consider sleep study as an outpatient

## 2023-01-14 NOTE — Anesthesia Preprocedure Evaluation (Signed)
Anesthesia Evaluation  Patient identified by MRN, date of birth, ID band Patient awake  General Assessment Comment:  Poorly controlled diabetes, perirectal abscess contrinbuting to poor control. Patient had prior straightforward airway with videoscope.  Reviewed: Allergy & Precautions, NPO status , Patient's Chart, lab work & pertinent test results  History of Anesthesia Complications Negative for: history of anesthetic complications  Airway Mallampati: III  TM Distance: >3 FB Neck ROM: Full    Dental  (+) Poor Dentition, Chipped   Pulmonary neg sleep apnea, neg COPD, Current Smoker and Patient abstained from smoking. Signs and symptoms of OSA   Pulmonary exam normal breath sounds clear to auscultation       Cardiovascular Exercise Tolerance: Poor METShypertension, Pt. on medications + DOE  (-) CAD and (-) Past MI (-) dysrhythmias  Rhythm:Regular Rate:Normal - Systolic murmurs Stress test 2022: - Normal myocardial perfusion study  - No evidence of any significant ischemia or scar  - Left ventricular systolic function is normal. Post stress the ejection fraction is > 76%. The left ventricle is normal in size.  - Moderate coronary calcifications were noted on the attenuation CT  - Incidental CT findings: None     Neuro/Psych negative neurological ROS  negative psych ROS   GI/Hepatic ,neg GERD  ,,(+)     (-) substance abuse    Endo/Other  diabetes, Poorly Controlled, Type 2, Insulin Dependent    Renal/GU CRFRenal disease     Musculoskeletal   Abdominal  (+) + obese  Peds  Hematology  (+) HIVPer patient undetectable viral load   Anesthesia Other Findings Past Medical History: No date: History of thrush No date: HIV (human immunodeficiency virus infection) (HCC)     Comment:  Followed by Dr. Johnnye Sima in [place] [facility].  No date: Hyperlipidemia No date: Hypertension No date: Insomnia No date: Tobacco abuse No  date: Type II diabetes mellitus (HCC)  Reproductive/Obstetrics                              Anesthesia Physical Anesthesia Plan  ASA: 3  Anesthesia Plan: General   Post-op Pain Management: Ofirmev IV (intra-op)*   Induction: Intravenous  PONV Risk Score and Plan: 3 and Ondansetron, Dexamethasone and Midazolam  Airway Management Planned: Oral ETT and Video Laryngoscope Planned  Additional Equipment: None  Intra-op Plan:   Post-operative Plan: Extubation in OR  Informed Consent: I have reviewed the patients History and Physical, chart, labs and discussed the procedure including the risks, benefits and alternatives for the proposed anesthesia with the patient or authorized representative who has indicated his/her understanding and acceptance.     Dental advisory given  Plan Discussed with: CRNA and Surgeon  Anesthesia Plan Comments: (Discussed risks of anesthesia with patient, including PONV, sore throat, lip/dental/eye damage. Rare risks discussed as well, such as cardiorespiratory and neurological sequelae, and allergic reactions. Discussed the role of CRNA in patient's perioperative care. Patient understands.)         Anesthesia Quick Evaluation

## 2023-01-15 ENCOUNTER — Encounter: Payer: Self-pay | Admitting: Surgery

## 2023-01-15 DIAGNOSIS — L02215 Cutaneous abscess of perineum: Secondary | ICD-10-CM | POA: Diagnosis not present

## 2023-01-15 DIAGNOSIS — I5032 Chronic diastolic (congestive) heart failure: Secondary | ICD-10-CM | POA: Diagnosis not present

## 2023-01-15 DIAGNOSIS — E1122 Type 2 diabetes mellitus with diabetic chronic kidney disease: Secondary | ICD-10-CM | POA: Diagnosis not present

## 2023-01-15 DIAGNOSIS — E119 Type 2 diabetes mellitus without complications: Secondary | ICD-10-CM | POA: Diagnosis not present

## 2023-01-15 LAB — GLUCOSE, CAPILLARY
Glucose-Capillary: 378 mg/dL — ABNORMAL HIGH (ref 70–99)
Glucose-Capillary: 325 mg/dL — ABNORMAL HIGH (ref 70–99)
Glucose-Capillary: 350 mg/dL — ABNORMAL HIGH (ref 70–99)
Glucose-Capillary: 232 mg/dL — ABNORMAL HIGH (ref 70–99)
Glucose-Capillary: 320 mg/dL — ABNORMAL HIGH (ref 70–99)

## 2023-01-15 LAB — BASIC METABOLIC PANEL
Anion gap: 11 (ref 5–15)
BUN: 22 mg/dL — ABNORMAL HIGH (ref 6–20)
CO2: 24 mmol/L (ref 22–32)
Calcium: 8.6 mg/dL — ABNORMAL LOW (ref 8.9–10.3)
Chloride: 99 mmol/L (ref 98–111)
Creatinine, Ser: 1.67 mg/dL — ABNORMAL HIGH (ref 0.61–1.24)
GFR, Estimated: 49 mL/min — ABNORMAL LOW (ref >60)
Glucose, Bld: 342 mg/dL — ABNORMAL HIGH (ref 70–99)
Potassium: 3.1 mmol/L — ABNORMAL LOW (ref 3.5–5.1)
Sodium: 134 mmol/L — ABNORMAL LOW (ref 135–145)

## 2023-01-15 LAB — MAGNESIUM: Magnesium: 2.4 mg/dL (ref 1.7–2.4)

## 2023-01-15 LAB — CBC
HCT: 40.5 % (ref 39.0–52.0)
Hemoglobin: 13.1 g/dL (ref 13.0–17.0)
MCH: 31.9 pg (ref 26.0–34.0)
MCHC: 32.3 g/dL (ref 30.0–36.0)
MCV: 98.5 fL (ref 80.0–100.0)
Platelets: 235 10*3/uL (ref 150–400)
RBC: 4.11 MIL/uL — ABNORMAL LOW (ref 4.22–5.81)
RDW: 13.5 % (ref 11.5–15.5)
WBC: 10.7 10*3/uL — ABNORMAL HIGH (ref 4.0–10.5)
nRBC: 0 % (ref 0.0–0.2)

## 2023-01-15 MED ORDER — INSULIN GLARGINE-YFGN 100 UNIT/ML ~~LOC~~ SOLN
50.0000 [IU] | Freq: Two times a day (BID) | SUBCUTANEOUS | Status: DC
  Administered 2023-01-15 (×2): 50 [IU] via SUBCUTANEOUS
  Filled 2023-01-15 (×3): qty 0.5

## 2023-01-15 MED ORDER — POTASSIUM CHLORIDE CRYS ER 20 MEQ PO TBCR
40.0000 meq | EXTENDED_RELEASE_TABLET | Freq: Once | ORAL | Status: AC
  Administered 2023-01-15: 40 meq via ORAL
  Filled 2023-01-15: qty 2

## 2023-01-15 NOTE — Inpatient Diabetes Management (Addendum)
Inpatient Diabetes Program Recommendations  AACE/ADA: New Consensus Statement on Inpatient Glycemic Control (2015)  Target Ranges:  Prepandial:   less than 140 mg/dL      Peak postprandial:   less than 180 mg/dL (1-2 hours)      Critically ill patients:  140 - 180 mg/dL   Lab Results  Component Value Date   GLUCAP 325 (H) 01/15/2023   HGBA1C 10.3 (H) 01/12/2023    Review of Glycemic Control  Latest Reference Range & Units 01/14/23 08:50 01/14/23 12:41 01/14/23 14:04 01/14/23 15:50 01/14/23 17:02 01/14/23 20:28 01/15/23 09:01  Glucose-Capillary 70 - 99 mg/dL 323 (H) 422 (H) 378 (H) 259 (H) 212 (H) 240 (H) 325 (H)  (H): Data is abnormally high  Diabetes history: DM2  Outpatient Diabetes medications:  U500 200/100/200 Victoza 1.8 mg QD Metformin 1 gm bid Jardiance 25 QD  Lantus 45 BID (Not taking) NPH 95 BID (not taking)  Current orders for Inpatient glycemic control: Semglee 50 units QHS, Novolog 0-20 units TID and 0-5 QHS, Novolog 5 units TID with meals  Inpatient Diabetes Program Recommendations:    Semglee 50 units BID  Spoke with patient at bedside.  He verbalizes above home medications.  He follows up with [facility] and sees them every 1-2 months.  They have recently stopped his Lantus and started Victoza.  He states his last A1C was 14%.  He is very happy with his current A1C of 10.3%.  Explained this is an average glucose of 249 mg/dL.  He states he drinks sweet tea.  Encouraged elimination of caloric beverages.  Ordered him the LWWD.  Will continue to follow while inpatient.  Thank you, Harold Dixon, MSN, [place] Diabetes Coordinator Inpatient Diabetes Program [phone] (team pager from 8a-5p)

## 2023-01-15 NOTE — Progress Notes (Signed)
POD # 1 perirectal abscess extending into the scrotum early Fornier's AVSS Wbc nm BS still very high Refused to have wound repacked Gram stain with gram-negative rods, pending culture  PE NAD Wound healing well.  Significant improvement in perianal and scrotal edema and erythema. No evidence of worsening infection or necrosis  A/p doing well.  Recommend continuation of antibiotics and another look at the wound tomorrow to make sure that he does not need further debridement Titrate antibiotics based on cultures

## 2023-01-15 NOTE — Progress Notes (Signed)
Progress Note   Patient: Harold Crosby [id number]:[id number] DOB: [birth year] DOA: 01/12/2023     3 DOS: the patient was seen and examined on 01/15/2023   Brief hospital course: 52 y.o. male with medical history significant of uncontrolled type 2 diabetes, CKD stage IIIb, HIV, hypertension, HFpEF with last EF of 60-65%, admitted for perirectal abscess   1/20: No need for debridement yet per surgical team, continue antibiotics 1/21: I&D  1/22: Adjusted insulin regimen for better blood sugar control per diabetic nurse  Assessment and Plan: * Perineal/perirectal abscess extending into the scrotum/early fornier's Patient presenting with 2-week history of perirectal pain, erythema, drainage, rigors, nausea and vomiting.  CT imaging with evidence of inflammatory stranding and thickening with ill-defined fluid along the left perineum.  The area of inflammation extends into the base of the scrotum.    Sepsis ruled out  - Status post I&D yesterday with good postop recovery - Continue cefepime, vancomycin and Flagyl - Blood cultures negative  Type 2 diabetes mellitus without complications (HCC) History of uncontrolled diabetes with last A1c over 14% in 2022.  - A1c 10.3 - Continue SSI, resistant scale - Increase insulin Semglee to 50 units twice daily for better blood sugar control - Continue insulin NovoLog 5 units subcu 3 times daily with each meals diabetic nurse following  (HFpEF) heart failure with preserved ejection fraction ([place]) Patient has a history of HFpEF with last EF of 60-65%, currently on Lasix 80 mg 3 times a day.  Despite this, he continues to have 2+ pitting edema up to the knees.  No pulmonary rales or JVD.  He has received IV fluids in the ED due to active infection.  Will hold off on further fluids at this time and monitor fluid status carefully.  - BNP 222.7 - Continue Lasix as he is getting positive fluid balance now - Strict in and out - Daily weights. Net IO Since Admission:  4,343.72 mL [01/15/23 1630]   CKD stage 3 secondary to diabetes [facility]) Renal function currently at baseline.  - Monitor BMP while receiving nephrotoxic agents  Lab Results  Component Value Date   CREATININE 1.67 (H) 01/15/2023   CREATININE 1.79 (H) 01/14/2023   CREATININE 1.75 (H) 01/13/2023     Apnea Patient has a history of apnea at home that has been witnessed by family.  High risk for OSA but no formal sleep study.  Consider sleep study as an outpatient.         Subjective: Feeling much better status post I&D yesterday.  Blood sugar still staying up  Physical Exam: Vitals:   01/14/23 2030 01/15/23 0550 01/15/23 0800 01/15/23 1627  BP: (!) 142/85 129/65 (!) 154/88 119/88  Pulse: 87 95 92 80  Resp: 18 18 18 16   Temp: 98 F (36.7 C) 98 F (36.7 C) 98.3 F (36.8 C) 98.1 F (36.7 C)  TempSrc: Oral Oral Axillary Oral  SpO2:  95% 96% 97%  Weight:  108.4 kg    Height:       52 year old obese male lying in the bed comfortably without any acute distress Lungs clear to auscultation bilaterally Heart regular rate and rhythm Abdomen soft, obese, benign Skin/GU: Wound healing well.  Packing in place, improving perianal and scrotal edema/erythema Data Reviewed:  Potassium 3.1, creatinine 1.67 , Gram stain with gram-negative rods  Family Communication: None  Disposition: Status is: Inpatient Remains inpatient appropriate because: Management of perirectal abscess  Planned Discharge Destination: Home   DVT prophylaxis-SCDs Time spent: 55  minutes  Author: Max Sane, MD 01/15/2023 4:31 PM  For on call review [email or web address].

## 2023-01-15 NOTE — Assessment & Plan Note (Signed)
Patient presenting with 2-week history of perirectal pain, erythema, drainage, rigors, nausea and vomiting.  CT imaging with evidence of inflammatory stranding and thickening with ill-defined fluid along the left perineum.  The area of inflammation extends into the base of the scrotum.    Sepsis ruled out  - Status post I&D yesterday with good postop recovery - Continue cefepime, vancomycin and Flagyl - Blood cultures negative

## 2023-01-15 NOTE — Assessment & Plan Note (Signed)
Patient has a history of apnea at home that has been witnessed by family.  High risk for OSA but no formal sleep study.  Consider sleep study as an outpatient  

## 2023-01-15 NOTE — Progress Notes (Signed)
Patient removed packing while using bathroom. Stated packing fell out, RN alerted him of the need for it to be changed once more. Patient refused, RN educated patient on need of packing post I&D. Pain medication given, will continue to monitor.

## 2023-01-15 NOTE — Assessment & Plan Note (Signed)
Patient has a history of HFpEF with last EF of 60-65%, currently on Lasix 80 mg 3 times a day.  Despite this, he continues to have 2+ pitting edema up to the knees.  No pulmonary rales or JVD.  He has received IV fluids in the ED due to active infection.  Will hold off on further fluids at this time and monitor fluid status carefully.  - BNP 222.7 - Continue Lasix as he is getting positive fluid balance now - Strict in and out - Daily weights. Net IO Since Admission: 4,343.72 mL [01/15/23 1630]

## 2023-01-15 NOTE — Care Management Important Message (Signed)
Important Message  Patient Details  Name: Harold Crosby MRN: [id number] Date of Birth: Jan 19, 1971   Medicare Important Message Given:  N/A - LOS <3 / Initial given by admissions     [name] 01/15/2023, 8:37 AM

## 2023-01-15 NOTE — Assessment & Plan Note (Signed)
Renal function currently at baseline.  - Monitor BMP while receiving nephrotoxic agents  Lab Results  Component Value Date   CREATININE 1.67 (H) 01/15/2023   CREATININE 1.79 (H) 01/14/2023   CREATININE 1.75 (H) 01/13/2023

## 2023-01-15 NOTE — TOC CM/SW Note (Signed)
TOC following for potential discharge needs.  Dayton Scrape, [place]

## 2023-01-15 NOTE — Assessment & Plan Note (Addendum)
History of uncontrolled diabetes with last A1c over 14% in 2022.  - A1c 10.3 - Continue SSI, resistant scale - Increase insulin Semglee to 50 units twice daily for better blood sugar control - Continue insulin NovoLog 5 units subcu 3 times daily with each meals diabetic nurse following

## 2023-01-16 ENCOUNTER — Telehealth: Payer: Self-pay | Admitting: Surgery

## 2023-01-16 DIAGNOSIS — L02215 Cutaneous abscess of perineum: Secondary | ICD-10-CM | POA: Diagnosis not present

## 2023-01-16 DIAGNOSIS — E872 Acidosis, unspecified: Secondary | ICD-10-CM

## 2023-01-16 DIAGNOSIS — I509 Heart failure, unspecified: Secondary | ICD-10-CM

## 2023-01-16 DIAGNOSIS — R739 Hyperglycemia, unspecified: Secondary | ICD-10-CM

## 2023-01-16 DIAGNOSIS — E119 Type 2 diabetes mellitus without complications: Secondary | ICD-10-CM | POA: Diagnosis not present

## 2023-01-16 LAB — CBC
HCT: 39.1 % (ref 39.0–52.0)
Hemoglobin: 13 g/dL (ref 13.0–17.0)
MCH: 32.4 pg (ref 26.0–34.0)
MCHC: 33.2 g/dL (ref 30.0–36.0)
MCV: 97.5 fL (ref 80.0–100.0)
Platelets: 250 10*3/uL (ref 150–400)
RBC: 4.01 MIL/uL — ABNORMAL LOW (ref 4.22–5.81)
RDW: 13.1 % (ref 11.5–15.5)
WBC: 7.4 10*3/uL (ref 4.0–10.5)
nRBC: 0 % (ref 0.0–0.2)

## 2023-01-16 LAB — BASIC METABOLIC PANEL
Anion gap: 10 (ref 5–15)
BUN: 26 mg/dL — ABNORMAL HIGH (ref 6–20)
CO2: 24 mmol/L (ref 22–32)
Calcium: 8.6 mg/dL — ABNORMAL LOW (ref 8.9–10.3)
Chloride: 101 mmol/L (ref 98–111)
Creatinine, Ser: 1.7 mg/dL — ABNORMAL HIGH (ref 0.61–1.24)
GFR, Estimated: 48 mL/min — ABNORMAL LOW (ref >60)
Glucose, Bld: 273 mg/dL — ABNORMAL HIGH (ref 70–99)
Potassium: 3.6 mmol/L (ref 3.5–5.1)
Sodium: 135 mmol/L (ref 135–145)

## 2023-01-16 LAB — SURGICAL PATHOLOGY

## 2023-01-16 LAB — GLUCOSE, CAPILLARY: Glucose-Capillary: 299 mg/dL — ABNORMAL HIGH (ref 70–99)

## 2023-01-16 MED ORDER — AMOXICILLIN-POT CLAVULANATE 500-125 MG PO TABS: 1.0000 | ORAL_TABLET | Freq: Three times a day (TID) | ORAL | 0 refills | Status: AC

## 2023-01-16 MED ORDER — NICOTINE 14 MG/24HR TD PT24
14.0000 mg | MEDICATED_PATCH | Freq: Every day | TRANSDERMAL | Status: DC
  Administered 2023-01-16: 14 mg via TRANSDERMAL
  Filled 2023-01-16: qty 1

## 2023-01-16 NOTE — Plan of Care (Signed)
Patient AOX4, VSS throughout shift.  All meds given on time as ordered.  Pt removed his PIV this morning before abx.  PIV team ordered to get another PIV so pt can receive abx.  Diminished lungs, IS encouraged.  Pt voided in urinal.  Perineal pads changed this shift.  POC maintained, will continue to monitor.  Problem: Education: Goal: Ability to describe self-care measures that may prevent or decrease complications (Diabetes Survival Skills Education) will improve Outcome: Progressing Goal: Individualized Educational Video(s) Outcome: Progressing   Problem: Coping: Goal: Ability to adjust to condition or change in health will improve Outcome: Progressing   Problem: Fluid Volume: Goal: Ability to maintain a balanced intake and output will improve Outcome: Progressing   Problem: Health Behavior/Discharge Planning: Goal: Ability to identify and utilize available resources and services will improve Outcome: Progressing Goal: Ability to manage health-related needs will improve Outcome: Progressing   Problem: Metabolic: Goal: Ability to maintain appropriate glucose levels will improve Outcome: Progressing   Problem: Nutritional: Goal: Maintenance of adequate nutrition will improve Outcome: Progressing Goal: Progress toward achieving an optimal weight will improve Outcome: Progressing   Problem: Skin Integrity: Goal: Risk for impaired skin integrity will decrease Outcome: Progressing   Problem: Tissue Perfusion: Goal: Adequacy of tissue perfusion will improve Outcome: Progressing   Problem: Education: Goal: Knowledge of General Education information will improve Description: Including pain rating scale, medication(s)/side effects and non-pharmacologic comfort measures Outcome: Progressing   Problem: Health Behavior/Discharge Planning: Goal: Ability to manage health-related needs will improve Outcome: Progressing   Problem: Clinical Measurements: Goal: Ability to maintain  clinical measurements within normal limits will improve Outcome: Progressing Goal: Will remain free from infection Outcome: Progressing Goal: Diagnostic test results will improve Outcome: Progressing Goal: Respiratory complications will improve Outcome: Progressing Goal: Cardiovascular complication will be avoided Outcome: Progressing   Problem: Activity: Goal: Risk for activity intolerance will decrease Outcome: Progressing   Problem: Nutrition: Goal: Adequate nutrition will be maintained Outcome: Progressing   Problem: Coping: Goal: Level of anxiety will decrease Outcome: Progressing   Problem: Elimination: Goal: Will not experience complications related to bowel motility Outcome: Progressing Goal: Will not experience complications related to urinary retention Outcome: Progressing   Problem: Pain Managment: Goal: General experience of comfort will improve Outcome: Progressing   Problem: Safety: Goal: Ability to remain free from injury will improve Outcome: Progressing   Problem: Skin Integrity: Goal: Risk for impaired skin integrity will decrease Outcome: Progressing

## 2023-01-16 NOTE — Discharge Instructions (Signed)
In addition to included general post-operative instructions,  Diet: Resume home diet.   Wound care: Pack wound daily with saline moistened gauze; cover, secure. Change this daily and as needed. Okay to remove to take a short shower. May benefit from mesh underwear to help secure dressing.   Medications: Resume all home medications. For mild to moderate pain: acetaminophen (Tylenol) or ibuprofen/naproxen (if no kidney disease). Combining Tylenol with alcohol can substantially increase your risk of causing liver disease. Narcotic pain medications, if prescribed, can be used for severe pain, though may cause nausea, constipation, and drowsiness. Do not combine Tylenol and Percocet (or similar) within a 6 hour period as Percocet (and similar) contain(s) Tylenol. If you do not need the narcotic pain medication, you do not need to fill the prescription.  Call office [phone] / [phone]) at any time if any questions, worsening pain, fevers/chills, bleeding, drainage from incision site, or other concerns.

## 2023-01-16 NOTE — Progress Notes (Signed)
[facility] Day(s): 4.   Post op day(s): 2 Days Post-Op.   Interval History:  Patient seen and examined No acute events or new complaints overnight.  Patient reports he is feeling much better No fever, chills Leukocytosis now resolved; 7.4K Renal function remains at baseline; sCr - 1.70; UO - 600 ccs + unmeasured  No electrolyte derangements Cx with GNR; on Cefepime, Flagyl, and Vancomycin  Vital signs in last 24 hours: [min-max] current  Temp:  [97.9 F (36.6 C)-98.3 F (36.8 C)] 97.9 F (36.6 C) (01/23 0509) Pulse Rate:  [80-94] 94 (01/23 0509) Resp:  [15-18] 16 (01/23 0509) BP: (119-162)/(85-88) 162/85 (01/23 0509) SpO2:  [96 %-99 %] 99 % (01/23 0509)     Height: 6' (182.9 cm) Weight: 108.4 kg BMI (Calculated): 32.4   Intake/Output last 2 shifts:  01/22 0701 - 01/23 0700 In: 2668.4 [P.O.:1200; IV Piggyback:1468.4] Out: 600 [Urine:600]   Physical Exam:  Constitutional: alert, cooperative and no distress  Respiratory: breathing non-labored at rest  Cardiovascular: regular rate and sinus rhythm  Integumentary: chaperone present; to the right perineum/scrotum there is a approximately 3 x 4 cm wound, wound bed with cautery marks, surrounding erythema improved, there is no tenderness, there is no evidence of further necrosis nor abscess   Labs:     Latest Ref Rng & Units 01/16/2023    4:18 AM 01/15/2023    5:20 AM 01/14/2023    5:27 AM  CBC  WBC 4.0 - 10.5 K/uL 7.4  10.7  11.4   Hemoglobin 13.0 - 17.0 g/dL 13.0  13.1  13.0   Hematocrit 39.0 - 52.0 % 39.1  40.5  40.3   Platelets 150 - 400 K/uL 250  235  225       Latest Ref Rng & Units 01/16/2023    4:18 AM 01/15/2023    5:20 AM 01/14/2023    5:27 AM  CMP  Glucose 70 - 99 mg/dL 273  342  394   BUN 6 - 20 mg/dL 26  22  27    Creatinine 0.61 - 1.24 mg/dL 1.70  1.67  1.79   Sodium 135 - 145 mmol/L 135  134  134   Potassium 3.5 - 5.1 mmol/L 3.6  3.1  3.2   Chloride 98 -  111 mmol/L 101  99  99   CO2 22 - 32 mmol/L 24  24  22    Calcium 8.9 - 10.3 mg/dL 8.6  8.6  8.6      Imaging studies: No new pertinent imaging studies   Assessment/Plan:  52 y.o. male 2 Days Post-Op s/p wide debridement of perineal abscess extending into the scrotum   - No need for additional debridements  - Follow up Cx; tailor Abx for home. Recommend completing 14 days total   - Wound Care: Pack wound daily with saline moistened gauze, cover, secure. Change daily. Okay to remove to shower.    - Pain control prn   - Added nicotine patch; encouraged smoking cessation   - Further management per primary service  - Discharge Planning; Okay for discharge from surgical perspective with Abx for home. I will see him in office in `10 days for wound check.    All of the above findings and recommendations were discussed with the patient, and the medical team, and all of patient's questions were answered to his expressed satisfaction.  -- Edison Simon, PA-C [facility] 01/16/2023, 7:19 AM M-F: 7am - 4pm

## 2023-01-16 NOTE — Plan of Care (Signed)
Problem: Education: Goal: Ability to describe self-care measures that may prevent or decrease complications (Diabetes Survival Skills Education) will improve 01/16/2023 1034 by Verl Dicker, RN Outcome: Adequate for Discharge 01/16/2023 0915 by Verl Dicker, RN Outcome: Progressing Goal: Individualized Educational Video(s) 01/16/2023 1034 by Verl Dicker, RN Outcome: Adequate for Discharge 01/16/2023 0915 by Verl Dicker, RN Outcome: Progressing   Problem: Coping: Goal: Ability to adjust to condition or change in health will improve 01/16/2023 1034 by Verl Dicker, RN Outcome: Adequate for Discharge 01/16/2023 0915 by Verl Dicker, RN Outcome: Progressing   Problem: Fluid Volume: Goal: Ability to maintain a balanced intake and output will improve 01/16/2023 1034 by Verl Dicker, RN Outcome: Adequate for Discharge 01/16/2023 0915 by Verl Dicker, RN Outcome: Progressing   Problem: Health Behavior/Discharge Planning: Goal: Ability to identify and utilize available resources and services will improve 01/16/2023 1034 by Verl Dicker, RN Outcome: Adequate for Discharge 01/16/2023 0915 by Verl Dicker, RN Outcome: Progressing Goal: Ability to manage health-related needs will improve 01/16/2023 1034 by Verl Dicker, RN Outcome: Adequate for Discharge 01/16/2023 0915 by Verl Dicker, RN Outcome: Progressing   Problem: Metabolic: Goal: Ability to maintain appropriate glucose levels will improve 01/16/2023 1034 by Verl Dicker, RN Outcome: Adequate for Discharge 01/16/2023 0915 by Verl Dicker, RN Outcome: Progressing   Problem: Nutritional: Goal: Maintenance of adequate nutrition will improve 01/16/2023 1034 by Verl Dicker, RN Outcome: Adequate for Discharge 01/16/2023 0915 by Verl Dicker, RN Outcome: Progressing Goal: Progress toward achieving an optimal weight will improve 01/16/2023 1034 by Verl Dicker, RN Outcome: Adequate for Discharge 01/16/2023 0915 by Verl Dicker, RN Outcome: Progressing   Problem: Skin Integrity: Goal: Risk for impaired skin integrity will decrease 01/16/2023 1034 by Verl Dicker, RN Outcome: Adequate for Discharge 01/16/2023 0915 by Verl Dicker, RN Outcome: Progressing   Problem: Tissue Perfusion: Goal: Adequacy of tissue perfusion will improve 01/16/2023 1034 by Verl Dicker, RN Outcome: Adequate for Discharge 01/16/2023 0915 by Verl Dicker, RN Outcome: Progressing   Problem: Education: Goal: Knowledge of General Education information will improve Description: Including pain rating scale, medication(s)/side effects and non-pharmacologic comfort measures 01/16/2023 1034 by Verl Dicker, RN Outcome: Adequate for Discharge 01/16/2023 0915 by Verl Dicker, RN Outcome: Progressing   Problem: Health Behavior/Discharge Planning: Goal: Ability to manage health-related needs will improve 01/16/2023 1034 by Verl Dicker, RN Outcome: Adequate for Discharge 01/16/2023 0915 by Verl Dicker, RN Outcome: Progressing   Problem: Clinical Measurements: Goal: Ability to maintain clinical measurements within normal limits will improve 01/16/2023 1034 by Verl Dicker, RN Outcome: Adequate for Discharge 01/16/2023 0915 by Verl Dicker, RN Outcome: Progressing Goal: Will remain free from infection 01/16/2023 1034 by Verl Dicker, RN Outcome: Adequate for Discharge 01/16/2023 0915 by Verl Dicker, RN Outcome: Progressing Goal: Diagnostic test results will improve 01/16/2023 1034 by Verl Dicker, RN Outcome: Adequate for Discharge 01/16/2023 0915 by Verl Dicker, RN Outcome: Progressing Goal: Respiratory complications will improve 01/16/2023 1034 by Verl Dicker, RN Outcome: Adequate for Discharge 01/16/2023 0915 by Verl Dicker, RN Outcome: Progressing Goal: Cardiovascular  complication will be avoided 01/16/2023 1034 by Verl Dicker, RN Outcome: Adequate for Discharge 01/16/2023 0915 by Verl Dicker, RN Outcome: Progressing   Problem: Activity: Goal: Risk for activity intolerance will decrease 01/16/2023 1034 by Verl Dicker, RN Outcome: Adequate for Discharge  01/16/2023 0915 by Jodi Mourning D, RN Outcome: Progressing   Problem: Nutrition: Goal: Adequate nutrition will be maintained 01/16/2023 1034 by Verl Dicker, RN Outcome: Adequate for Discharge 01/16/2023 0915 by Verl Dicker, RN Outcome: Progressing   Problem: Coping: Goal: Level of anxiety will decrease 01/16/2023 1034 by Verl Dicker, RN Outcome: Adequate for Discharge 01/16/2023 0915 by Verl Dicker, RN Outcome: Progressing   Problem: Elimination: Goal: Will not experience complications related to bowel motility 01/16/2023 1034 by Verl Dicker, RN Outcome: Adequate for Discharge 01/16/2023 0915 by Verl Dicker, RN Outcome: Progressing Goal: Will not experience complications related to urinary retention 01/16/2023 1034 by Verl Dicker, RN Outcome: Adequate for Discharge 01/16/2023 0915 by Verl Dicker, RN Outcome: Progressing   Problem: Pain Managment: Goal: General experience of comfort will improve 01/16/2023 1034 by Verl Dicker, RN Outcome: Adequate for Discharge 01/16/2023 0915 by Verl Dicker, RN Outcome: Progressing   Problem: Safety: Goal: Ability to remain free from injury will improve 01/16/2023 1034 by Verl Dicker, RN Outcome: Adequate for Discharge 01/16/2023 0915 by Verl Dicker, RN Outcome: Progressing   Problem: Skin Integrity: Goal: Risk for impaired skin integrity will decrease 01/16/2023 1034 by Verl Dicker, RN Outcome: Adequate for Discharge 01/16/2023 0915 by Verl Dicker, RN Outcome: Progressing

## 2023-01-16 NOTE — Telephone Encounter (Signed)
Patient had I&D perirectal abscess done on 01/14/23 by Dr. Dahlia Byes.  Patient states that pain too much, needs something called in.  [facility] uses [facility] in [place]. Please call him . Thank you.

## 2023-01-16 NOTE — TOC CM/SW Note (Signed)
Patient has orders to discharge home today. Chart reviewed. No TOC needs identified. CSW signing off.  Dayton Scrape, Ashley

## 2023-01-16 NOTE — Inpatient Diabetes Management (Signed)
Inpatient Diabetes Program Recommendations  AACE/ADA: New Consensus Statement on Inpatient Glycemic Control (2015)  Target Ranges:  Prepandial:   less than 140 mg/dL      Peak postprandial:   less than 180 mg/dL (1-2 hours)      Critically ill patients:  140 - 180 mg/dL   Lab Results  Component Value Date   GLUCAP 320 (H) 01/15/2023   HGBA1C 10.3 (H) 01/12/2023     Outpatient Diabetes medications:  U500 200/100/200 Victoza 1.8 mg QD Metformin 1 gm bid Jardiance 25 QD  Lantus 45 BID (Not taking) NPH 95 BID (Not taking)   Current orders for Inpatient glycemic control: Semglee 50 units bid, Novolog 0-20 units TID and 0-5 QHS, Novolog 5 units TID with meals  Inpatient Diabetes Program Recommendations:   Blood glucose this am 273. Please consider: -Add Novolog 0-5 units hs correction -Increase Novolog meal coverage to 8 units tid if pt. Eats 50% or greater meals -Increase Semglee to 55 units bid  Thank you, Nani Gasser. Jamine Highfill, RN, MSN, CDE  Diabetes Coordinator Inpatient Glycemic Control Team Team Pager [phone] (8am-5pm) 01/16/2023 8:05 AM

## 2023-01-16 NOTE — Plan of Care (Signed)

## 2023-01-17 ENCOUNTER — Other Ambulatory Visit: Payer: Self-pay | Admitting: Infectious Diseases

## 2023-01-17 ENCOUNTER — Other Ambulatory Visit: Payer: Self-pay | Admitting: Physician Assistant

## 2023-01-17 ENCOUNTER — Telehealth: Payer: Self-pay | Admitting: *Deleted

## 2023-01-17 DIAGNOSIS — B20 Human immunodeficiency virus [HIV] disease: Secondary | ICD-10-CM

## 2023-01-17 LAB — CULTURE, BLOOD (ROUTINE X 2)
Culture: NO GROWTH
Culture: NO GROWTH

## 2023-01-17 MED ORDER — IBUPROFEN 800 MG PO TABS: 800.0000 mg | ORAL_TABLET | Freq: Three times a day (TID) | ORAL | 0 refills | Status: DC | PRN

## 2023-01-17 MED ORDER — OXYCODONE HCL 5 MG PO TABS: 5.0000 mg | ORAL_TABLET | Freq: Four times a day (QID) | ORAL | 0 refills | Status: DC | PRN

## 2023-01-17 NOTE — Telephone Encounter (Signed)
Patient called and wants to see if he can get some pain medicaion. He stated that they did not give him a prescrption for any. He had a I&D perirectal abscess on 01/14/23 by Dr Dahlia Byes.  [facility] [place]

## 2023-01-18 NOTE — Discharge Summary (Signed)
Physician Discharge Summary   Patient: Harold Crosby MRN: [id number] DOB: March 17, 1971  Admit date:     01/12/2023  Discharge date: 01/16/2023  Discharge Physician: Delfino Lovett   [facility]   Recommendations at discharge:    F/up with outpt providers as requested  Discharge Diagnoses: Principal Problem:   Perineal abscess Active Problems:   Type 2 diabetes mellitus without complications (HCC)   (HFpEF) heart failure with preserved ejection fraction (HCC)   CKD stage 3 secondary to diabetes (HCC)   Apnea   Hyperglycemia   Lactic acidosis  Hospital Course: 52 y.o. 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  CT imaging with evidence of inflammatory stranding and thickening with ill-defined fluid along the left perineum.  The area of inflammation extends into the base of the scrotum.   Sepsis ruled out - Status post I&D  - treated with IV Abx while in the hospital with good response. - Blood cultures negative -  2 Days Post-Op s/p wide debridement - No need for additional debridements - Wound Care: Pack wound daily with saline moistened gauze, cover, secure. Change daily. Okay to remove to shower.   - outpt f/up with surgery  Type 2 diabetes mellitus without complications (HCC) History of uncontrolled diabetes with last A1c over 14% in 2022. - A1c 10.3 this admission  (HFpEF) heart failure with preserved ejection fraction (HCC) Patient has a history of HFpEF with last EF of  60-65%. Well compensated at this time.  CKD stage 3 secondary to diabetes [facility]) Renal function currently at baseline.  Apnea Patient has a history of apnea at home that has been witnessed by family.  High risk for OSA but no formal sleep study.  Consider sleep study as an outpatient.          Consultants: Surgery Procedures performed: I & D  Disposition: Home Diet recommendation:  Discharge Diet Orders (From admission, onward)     Start     Ordered   01/16/23 0000  Diet - low sodium heart healthy        01/16/23 0834           Carb modified diet DISCHARGE MEDICATION: Allergies as of 01/16/2023       Reactions   Sulfonamide Derivatives Anaphylaxis   REACTION: skin welps, tongue swells, throat closes up        Medication List     STOP taking these medications    Jardiance 25 MG Tabs tablet Generic drug: empagliflozin       TAKE these medications    allopurinol 100 MG tablet Commonly known as: ZYLOPRIM Take 100 mg by mouth daily.   amoxicillin-clavulanate 500-125 MG tablet Commonly known as: Augmentin Take 1 tablet by mouth 3 (three) times daily for 10 days.   celecoxib 100 MG capsule Commonly known as: CELEBREX Take 100 mg by mouth 2 (two) times daily as needed.   colchicine 0.6 MG tablet Take 0.6 mg by mouth as needed.   finasteride 5 MG tablet Commonly known as: PROSCAR Take 5 mg by mouth  daily.   furosemide 40 MG tablet Commonly known as: LASIX Take 80 mg by mouth 3 (three) times daily.   gabapentin 600 MG tablet Commonly known as: NEURONTIN Take 1,200 mg by mouth 3 (three) times daily.   HumuLIN R U-500 KwikPen 500 UNIT/ML KwikPen Generic drug: insulin regular human CONCENTRATED Inject 100-200 Units into the skin 3 (three) times daily with meals.   insulin glargine 100 UNIT/ML injection Commonly known as: LANTUS Inject 0.45 mLs (45 Units total) into the skin 2 (two) times daily.   liraglutide 18 MG/3ML Sopn Commonly known as:  VICTOZA Inject 1.8 mg into the skin daily.   metFORMIN 1000 MG tablet Commonly known as: GLUCOPHAGE Take 1 tablet (1,000 mg total) by mouth 2 (two) times daily with a meal.   Odefsey 200-25-25 MG Tabs tablet Generic drug: emtricitabine-rilpivir-tenofovir AF TAKE 1 TABLET BY MOUTH EVERY DAY WITH BREAKFAST   rosuvastatin 20 MG tablet Commonly known as: CRESTOR Take 1 tablet by mouth daily.   tiZANidine 4 MG tablet Commonly known as: ZANAFLEX Take 4 mg by mouth 3 (three) times daily.               Discharge Care Instructions  (From admission, onward)           Start     Ordered   01/16/23 0000  Discharge wound care:       Comments: Wound Care: Pack wound daily with saline moistened gauze, cover, secure. Change daily. Okay to remove to shower.   01/16/23 8101            Follow-up Information     Tylene Fantasia, PA-C. Schedule an appointment as soon as possible for a visit in 10 day(s).   Specialty: Physician Assistant Why: 01/25/23 at 1:45 PM  s/p I&D of perineal abscess Contact information: [address] [place] [address] [phone]         [facility] [place] in 1 week(s).   Why: 01/22/23 9:30 AM  [facility] Discharge F/UP Contact information: [place] [address] [phone]                Discharge Exam: Danley Danker Weights   01/13/23 0449 01/14/23 0523 01/15/23 0550  Weight: 113.2 kg 109 kg 44.77 kg   52 year old obese male lying in the bed comfortably without any acute distress Lungs clear to auscultation bilaterally Heart regular rate and rhythm Abdomen soft, obese, benign Skin/GU: Wound healing well.  Packing in place, improving perianal and scrotal edema/erythema  Condition at discharge: fair  The results of significant diagnostics from this hospitalization (including imaging, microbiology, ancillary and laboratory) are listed below for reference.   Imaging Studies: CT PELVIS W  CONTRAST  Result Date: 01/12/2023 CLINICAL DATA:  Perianal fistula with abscess.  Present for 2 weeks. EXAM: CT PELVIS WITH CONTRAST TECHNIQUE: Multidetector CT imaging of the pelvis was performed using the standard protocol following the bolus administration of intravenous contrast. RADIATION DOSE REDUCTION: This exam was performed according to the departmental dose-optimization program which includes automated exposure control, adjustment of the mA and/or kV according to patient size and/or use of iterative reconstruction technique. CONTRAST:  151mL OMNIPAQUE IOHEXOL 300 MG/ML  SOLN COMPARISON:  CT 11/21/2015 abdomen and pelvis FINDINGS: Urinary Tract: Preserved contours of the urinary bladder. The distal ureters are nondilated. Prominent perinephric stranding noted along the lower retroperitoneum at the edge of the imaging field. Nonspecific. Bowel: The large bowel included in the pelvis has a normal course  and caliber. Scattered colonic stool. Normal caliber appendix seen at the edge of the imaging field in the right lower quadrant. Few colonic diverticula are identified. The small bowel visualized is also nondilated. Vascular/Lymphatic: Normal caliber visualized arterial and venous major vessels in the pelvis. Scattered vascular calcifications along the distal aorta and some of the iliac vessels. No specific abnormal lymph node enlargement present in the pelvis. There are some small nodes in the inguinal regions which are not pathologic by size criteria. Reproductive:  No mass or other significant abnormality Other: Mild anasarca with some skin thickening dorsally. Small bilateral fat containing inguinal hernias. There is some fluid along the scrotal margin with stranding extending along the perineum on both sides caudal to the anal region. There is similar confluent fluid along the margin of the left gluteal cleft inferior anterior and medial. This area is seen without gas and is measured on series 3, image  12 measuring 2.6 by 1.3 cm. There is some tracking of inflammatory changes anteriorly as well along the left side of the scrotal margin as seen on series 3, image 9. A distinct tract to the anal region is not well seen on this CT scan. If there is concern of a true anal fistula tract staging MRI for anal fistula can be performed when clinically appropriate. No clear fluid collections or stranding extending along the ischioanal fossa, above the pelvic floor musculature. Musculoskeletal: Surgical changes along the lower lumbar spine from laminectomies. Scattered degenerative changes are seen in the lower lumbar spine and scattered along the pelvis. IMPRESSION: Inflammatory stranding with thickening and ill-defined fluid along the low left perineum caudal to the anal canal and medially along the gluteal cleft margin with tracking of thickening and stranding anteriorly along the inguinal crease and base of the scrotum. No soft tissue gas or other well-defined fluid collections clearly identified in addition there is no clear tract extending to the anal canal. If there is further concern of than actual tract and further anatomy is desired, dedicated anal fistula protocol MRI can be performed as clinically indicated. Electronically Signed   By: Jill Side M.D.   On: 01/12/2023 12:51    Microbiology: Results for orders placed or performed during the hospital encounter of 01/12/23  Culture, blood (routine x 2)     Status: None   Collection Time: 01/12/23  1:26 PM   Specimen: BLOOD  Result Value Ref Range Status   Specimen Description BLOOD  Final   Special Requests   Final    BOTTLES DRAWN AEROBIC AND ANAEROBIC Blood Culture results may not be optimal due to an inadequate volume of blood received in culture bottles   Culture   Final    NO GROWTH 5 DAYS Performed at [facility], [address] [place], [place] [address]    Report Status 01/17/2023 FINAL  Final  Culture, blood (routine x 2)      Status: None   Collection Time: 01/12/23  1:31 PM   Specimen: BLOOD  Result Value Ref Range Status   Specimen Description BLOOD  Final   Special Requests   Final    BOTTLES DRAWN AEROBIC AND ANAEROBIC Blood Culture results may not be optimal due to an inadequate volume of blood received in culture bottles   Culture   Final    NO GROWTH 5 DAYS Performed at [facility], [address], [place], [place] [address]    Report Status 01/17/2023 FINAL  Final  Aerobic/Anaerobic Culture w Gram  Stain (surgical/deep wound)     Status: None (Preliminary result)   Collection Time: 01/14/23  3:14 PM   Specimen: PATH Other; Tissue  Result Value Ref Range Status   Specimen Description   Final    PERIRECTAL Performed at [facility], [address] [place], [place] [address]    Special Requests ABSCESS  Final   Gram Stain   Final    FEW WBC PRESENT, PREDOMINANTLY PMN FEW GRAM NEGATIVE RODS    Culture   Final    NO GROWTH 4 DAYS NO ANAEROBES ISOLATED; CULTURE IN PROGRESS FOR 5 DAYS Performed at [facility], [address] [place], [place] [address]    Report Status PENDING  Incomplete    Labs: CBC: Recent Labs  Lab 01/12/23 1102 01/13/23 0443 01/14/23 0527 01/15/23 0520 01/16/23 0418  WBC 9.5 11.3* 11.4* 10.7* 7.4  NEUTROABS 8.0* 8.8*  --   --   --   HGB 13.6 13.3 13.0 13.1 13.0  HCT 41.9 41.5 40.3 40.5 39.1  MCV 98.1 99.5 98.1 98.5 97.5  PLT 174 184 225 235 250   Basic Metabolic Panel: Recent Labs  Lab 01/12/23 1102 01/13/23 0443 01/14/23 0527 01/15/23 0520 01/16/23 0418  NA 134* 136 134* 134* 135  K 3.2* 3.5 3.2* 3.1* 3.6  CL 98 100 99 99 101  CO2 25 24 22 24 24   GLUCOSE 254* 212* 394* 342* 273*  BUN 17 20 27* 22* 26*  CREATININE 1.67* 1.75* 1.79* 1.67* 1.70*  CALCIUM 8.6* 8.5* 8.6* 8.6* 8.6*  MG  --  2.4 2.3 2.4  --    Liver Function Tests: No results for input(s): "AST", "ALT", "ALKPHOS", "BILITOT", "PROT", "ALBUMIN" in the last 168  hours. CBG: Recent Labs  Lab 01/15/23 0901 01/15/23 1216 01/15/23 1617 01/15/23 2154 01/16/23 0815  GLUCAP 325* 350* 232* 320* 299*    Discharge time spent: greater than 30 minutes.  Signed: 01/18/23, MD [facility] 01/18/2023

## 2023-01-19 ENCOUNTER — Other Ambulatory Visit: Payer: Self-pay | Admitting: Infectious Diseases

## 2023-01-19 DIAGNOSIS — B20 Human immunodeficiency virus [HIV] disease: Secondary | ICD-10-CM

## 2023-01-22 ENCOUNTER — Other Ambulatory Visit: Payer: Self-pay

## 2023-01-22 DIAGNOSIS — B20 Human immunodeficiency virus [HIV] disease: Secondary | ICD-10-CM

## 2023-01-22 LAB — AEROBIC/ANAEROBIC CULTURE W GRAM STAIN (SURGICAL/DEEP WOUND)

## 2023-01-22 MED ORDER — ODEFSEY 200-25-25 MG PO TABS: ORAL_TABLET | ORAL | 0 refills | Status: DC

## 2023-01-23 ENCOUNTER — Encounter: Payer: Self-pay | Admitting: Physician Assistant

## 2023-01-23 ENCOUNTER — Ambulatory Visit (INDEPENDENT_AMBULATORY_CARE_PROVIDER_SITE_OTHER): Payer: Medicare HMO | Admitting: Physician Assistant

## 2023-01-23 VITALS — BP 137/75 | HR 91 | Temp 97.9°F | Ht 73.0 in | Wt 235.8 lb

## 2023-01-23 DIAGNOSIS — L02215 Cutaneous abscess of perineum: Secondary | ICD-10-CM

## 2023-01-23 DIAGNOSIS — Z09 Encounter for follow-up examination after completed treatment for conditions other than malignant neoplasm: Secondary | ICD-10-CM | POA: Diagnosis not present

## 2023-01-23 MED ORDER — OXYCODONE HCL 5 MG PO TABS: 5.0000 mg | ORAL_TABLET | Freq: Four times a day (QID) | ORAL | 0 refills | Status: AC | PRN

## 2023-01-23 NOTE — Patient Instructions (Signed)
Incision and Drainage, Care After This sheet gives you information about how to care for yourself after your procedure. Your health care provider may also give you more specific instructions. If you have problems or questions, contact your health care provider. What can I expect after the procedure? After the procedure, it is common to have: Pain or discomfort around the incision site. Blood, fluid, or pus (drainage) from the incision. Redness and firm skin around the incision site. Follow these instructions at home: Medicines Take over-the-counter and prescription medicines only as told by your health care provider. If you were prescribed an antibiotic medicine, use or take it as told by your health care provider. Do not stop using the antibiotic even if you start to feel better. Wound care Follow instructions from your health care provider about how to take care of your wound. Make sure you: Wash your hands with soap and water before and after you change your bandage (dressing). If soap and water are not available, use hand sanitizer. Change your dressing and packing as told by your health care provider. If your dressing is dry or stuck when you try to remove it, moisten or wet the dressing with saline or water so that it can be removed without harming your skin or tissues. If your wound is packed, leave it in place until your health care provider tells you to remove it. To remove the packing, moisten or wet the packing with saline or water so that it can be removed without harming your skin or tissues. Leave stitches (sutures), skin glue, or adhesive strips in place. These skin closures may need to stay in place for 2 weeks or longer. If adhesive strip edges start to loosen and curl up, you may trim the loose edges. Do not remove adhesive strips completely unless your health care provider tells you to do that. Check your wound every day for signs of infection. Check for: More redness, swelling,  or pain. More fluid or blood. Warmth. Pus or a bad smell. If you were sent home with a drain tube in place, follow instructions from your health care provider about: How to empty it. How to care for it at home.  General instructions Rest the affected area. Do not take baths, swim, or use a hot tub until your health care provider approves. Ask your health care provider if you may take showers. You may only be allowed to take sponge baths. Return to your normal activities as told by your health care provider. Ask your health care provider what activities are safe for you. Your health care provider may put you on activity or lifting restrictions. The incision will continue to drain. It is normal to have some clear or slightly bloody drainage. The amount of drainage should lessen each day. Do not apply any creams, ointments, or liquids unless you have been told to by your health care provider. Keep all follow-up visits as told by your health care provider. This is important. Contact a health care provider if: Your cyst or abscess returns. You have more redness, swelling, or pain around your incision. You have more fluid or blood coming from your incision. Your incision feels warm to the touch. You have pus or a bad smell coming from your incision. You have red streaks above or below the incision site. Get help right away if: You have severe pain or bleeding. You cannot eat or drink without vomiting. You have a fever or chills. You have redness that spreads   quickly. You have decreased urine output. You become short of breath. You have chest pain. You cough up blood. The affected area becomes numb or starts to tingle. These symptoms may represent a serious problem that is an emergency. Do not wait to see if the symptoms will go away. Get medical help right away. Call your local emergency services (911 in the [place] Do not drive yourself to the hospital. Summary After this procedure, it is  common to have fluid, blood, or pus coming from the surgery site. Follow all home care instructions. You will be told how to take care of your incision, how to check for infection, and how to take medicines. If you were prescribed an antibiotic medicine, take it as told by your health care provider. Do not stop taking the antibiotic even if you start to feel better. Contact a health care provider if you have increased redness, swelling, or pain around your incision. Get help right away if you have chest pain, you vomit, you cough up blood, or you have shortness of breath. Keep all follow-up visits as told by your health care provider. This is important. This information is not intended to replace advice given to you by your health care provider. Make sure you discuss any questions you have with your health care provider. Document Revised: 03/16/2022 Document Reviewed: 09/22/2021 Elsevier Patient Education  2023 Elsevier Inc.  

## 2023-01-23 NOTE — Progress Notes (Signed)
[facility] POST-OP OFFICE VISIT  01/23/2023  HPI: Harold Crosby is a 52 y.o. male 9 days s/p wide debridement of perineal abscess extending into the scrotum with Dr Dahlia Byes  He is doing well Wife is doing packing at home; no issues  No fever, chills Wound with minimal serous drainage  Vital signs: BP 137/75   Pulse 91   Temp 97.9 F (36.6 C) (Oral)   Ht 6\' 1"  (1.854 m)   Wt 235 lb 12.8 oz (107 kg)   SpO2 97%   BMI 31.11 kg/m    Physical Exam: Constitutional: Well appearing male, NAD GU: Chaperone present, wound to the perineum is healing well; old packing removed. Minimal serous drainage. No erythema. Wound is starting to granulate expectedly.   Assessment/Plan: This is a 52 y.o. male 9 days s/p wide debridement of perineal abscess extending into the scrotum with Dr Dahlia Byes   - Pain control prn; I will give him a short relief of pain medications. He is unable to take NSAIDs secondary to kidney disease   - Reviewed wound care recommendation; pack daily as instructed. Wife is doing a great job.   - Continue good hygiene; especially after bowel movements   - I will see him again in 2-3 weeks for wound check. He understands to call with questions/concerns in the interim.   -- Edison Simon, PA-C [facility] 01/23/2023, 3:54 PM M-F: 7am - 4pm

## 2023-01-25 ENCOUNTER — Encounter: Payer: Medicare HMO | Admitting: Physician Assistant

## 2023-01-30 ENCOUNTER — Telehealth: Payer: Self-pay | Admitting: Surgery

## 2023-01-30 ENCOUNTER — Other Ambulatory Visit: Payer: Self-pay | Admitting: Physician Assistant

## 2023-01-30 MED ORDER — FLUCONAZOLE 100 MG PO TABS: 100.0000 mg | ORAL_TABLET | Freq: Every day | ORAL | 0 refills | Status: AC

## 2023-01-30 NOTE — Telephone Encounter (Signed)
Patient had I & D perirectal abscess done on 01/14/23 with Dr. Dahlia Byes.  Patient since has been put on different antibiotics.  Now patient is with a yeast infection and is needing something for that.   He can't do anything with Sulfa in it.  Please call him.  Thank you.

## 2023-02-06 ENCOUNTER — Telehealth: Payer: Self-pay

## 2023-02-06 ENCOUNTER — Ambulatory Visit: Payer: Medicare HMO | Admitting: Internal Medicine

## 2023-02-06 NOTE — Progress Notes (Deleted)
[facility]   CHIEF COMPLAINT    HIV follow up.    SUBJECTIVE:    Harold Crosby is a 52 y.o. male with PMHx as below who presents to the clinic for HIV follow up.   Please see A&P for the details of today's visit and status of the patient's medical problems.   Patient's Medications  New Prescriptions   No medications on file  Previous Medications   ALLOPURINOL (ZYLOPRIM) 100 MG TABLET    Take 100 mg by mouth daily.   CELECOXIB (CELEBREX) 100 MG CAPSULE    Take 100 mg by mouth 2 (two) times daily as needed.   COLCHICINE 0.6 MG TABLET    Take 0.6 mg by mouth as needed.   EMTRICITABINE-RILPIVIR-TENOFOVIR AF (ODEFSEY) 200-25-25 MG TABS TABLET    TAKE 1 TABLET BY MOUTH EVERY DAY WITH BREAKFAST   FINASTERIDE (PROSCAR) 5 MG TABLET    Take 5 mg by mouth daily.   FLUCONAZOLE (DIFLUCAN) 100 MG TABLET    Take 1 tablet (100 mg total) by mouth daily for 7 days.   FUROSEMIDE (LASIX) 40 MG TABLET    Take 80 mg by mouth 3 (three) times daily.   GABAPENTIN (NEURONTIN) 600 MG TABLET    Take 1,200 mg by mouth 3 (three) times daily.   HUMULIN R U-500 KWIKPEN 500 UNIT/ML KWIKPEN    Inject 100-200 Units into the skin 3 (three) times daily with meals.   IBUPROFEN (ADVIL) 800 MG TABLET    Take 1 tablet (800 mg total) by mouth every 8 (eight) hours as needed for mild pain or moderate pain.   INSULIN GLARGINE (LANTUS) 100 UNIT/ML INJECTION    Inject 0.45 mLs (45 Units total) into the skin 2 (two) times daily.   LIRAGLUTIDE (VICTOZA) 18 MG/3ML SOPN    Inject 1.8 mg into the skin daily.   METFORMIN (GLUCOPHAGE) 1000 MG TABLET    Take 1 tablet (1,000 mg total) by mouth 2 (two) times daily with a meal.   OXYCODONE (OXY IR/ROXICODONE) 5 MG IMMEDIATE RELEASE TABLET    Take 1 tablet (5 mg total) by mouth every 6 (six) hours as needed for severe pain or breakthrough pain.   ROSUVASTATIN (CRESTOR) 20 MG TABLET    Take 1 tablet by mouth daily.   TIZANIDINE (ZANAFLEX) 4 MG TABLET     Take 4 mg by mouth 3 (three) times daily.  Modified Medications   No medications on file  Discontinued Medications   No medications on file      Past Medical History:  Diagnosis Date   History of thrush    HIV (human immunodeficiency virus infection) ([facility])    Followed by Dr. Johnnye Crosby in [place] [facility].    Hyperlipidemia    Hypertension    Insomnia    Tobacco abuse    Type II diabetes mellitus (HCC)     Social History   Tobacco Use   Smoking status: Every Day    Packs/day: 1.00    Years: 20.00    Total pack years: 20.00    Types: Cigarettes   Smokeless tobacco: Never   Tobacco comments:    3-4 cigarettes a day   Substance Use Topics   Alcohol use: No   Drug use: No    Family History  Problem Relation Age of Onset   Coronary artery disease Other    Diabetes Other    Kidney failure Mother  Allergies  Allergen Reactions   Sulfonamide Derivatives Anaphylaxis    REACTION: skin welps, tongue swells, throat closes up    ROS   OBJECTIVE:    There were no vitals filed for this visit.   There is no height or weight on file to calculate BMI.  Physical Exam  Labs and Microbiology:    Latest Ref Rng & Units 01/16/2023    4:18 AM 01/15/2023    5:20 AM 01/14/2023    5:27 AM  CMP  Glucose 70 - 99 mg/dL 273  342  394   BUN 6 - 20 mg/dL 26  22  27   $ Creatinine 0.61 - 1.24 mg/dL 1.70  1.67  1.79   Sodium 135 - 145 mmol/L 135  134  134   Potassium 3.5 - 5.1 mmol/L 3.6  3.1  3.2   Chloride 98 - 111 mmol/L 101  99  99   CO2 22 - 32 mmol/L 24  24  22   $ Calcium 8.9 - 10.3 mg/dL 8.6  8.6  8.6       Latest Ref Rng & Units 01/16/2023    4:18 AM 01/15/2023    5:20 AM 01/14/2023    5:27 AM  CBC  WBC 4.0 - 10.5 K/uL 7.4  10.7  11.4   Hemoglobin 13.0 - 17.0 g/dL 13.0  13.1  13.0   Hematocrit 39.0 - 52.0 % 39.1  40.5  40.3   Platelets 150 - 400 K/uL 250  235  225      Lab Results  Component Value Date   HIV1RNAQUANT 35 (H) 11/11/2021   HIV1RNAQUANT <20 (H) 05/12/2021    HIV1RNAQUANT <20 (H) 11/11/2020   CD4TABS 502 05/12/2021   CD4TABS 531 11/11/2020   CD4TABS 483 04/22/2020    RPR and STI: Lab Results  Component Value Date   LABRPR NON-REACTIVE 11/11/2021   LABRPR NON-REACTIVE 05/12/2021   LABRPR NON-REACTIVE 11/11/2020   LABRPR NON-REACTIVE 04/22/2020   LABRPR NON-REACTIVE 06/02/2019    STI Results GC CT  06/02/2019 12:00 AM Negative  Negative   08/07/2018 12:00 AM Negative  Negative   01/24/2017 12:00 AM Negative  Negative   03/08/2016 12:00 AM Negative  Negative     Hepatitis B: Lab Results  Component Value Date   HEPBSAB NEG 09/10/2013   HEPBSAG NEG 05/01/2007   HEPBCAB NEG 05/01/2007   Hepatitis C: No results found for: "HEPCAB", "HCVRNAPCRQN" Hepatitis A: Lab Results  Component Value Date   HAV NEG 05/01/2007   Lipids: Lab Results  Component Value Date   CHOL 264 (H) 11/11/2020   TRIG 586 (H) 11/11/2020   HDL 36 (L) 11/11/2020   CHOLHDL 7.3 (H) 11/11/2020   VLDL 63 (H) 01/24/2017   LDLCALC  11/11/2020     Comment:     . LDL cholesterol not calculated. Triglyceride levels greater than 400 mg/dL invalidate calculated LDL results. . Reference range: <100 . Desirable range <100 mg/dL for primary prevention;   <70 mg/dL for patients with CHD or diabetic patients  with > or = 2 CHD risk factors. Marland Kitchen LDL-C is now calculated using the Martin-Hopkins  calculation, which is a validated novel method providing  better accuracy than the Friedewald equation in the  estimation of LDL-C.  Harold Crosby et al. Annamaria Helling. [id number]): 2061-2068  (http://education.[employer].com/faq/FAQ164)     Imaging: ***   ASSESSMENT & PLAN:    No problem-specific Assessment & Plan notes found for this encounter.   No orders of the defined  types were placed in this encounter.      *** Vaccines Influenza: give every year COVID: recommend vaccination if not already done Prevnar 20: Give x 1 if no prior pneumonia vaccine.    - If  only 1 dose of either PPSV 23 OR PCV 13 ----> give PCV 20 if > 1 year since last vaccine  - If received both PPSV 23 AND PCV 13 ----> give PCV 20 if > 5 years since last vaccine.  If < 5 years then wait to give PCV 20 Pneumovax-23: (if CD4 >200) give twice every 5 years apart before age 63, then once at age 45.  Give >8 weeks from [place] Prevnar-13: (preferably when CD4 >200) give once, give >1 year from last Pneumovax-23 Hepatitis A: give Havrix 2 dose series at 0 and 6-12 months if non-immune Hepatitis B: give Heplisav 2 dose series at 0 and 4 weeks if non-immune.  Repeat serology 2 months after vaccine and revaccinate if needed MenACWY: 2 dose primary series 8 weeks apart, then 1 dose booster every 5 years HPV: Gardasil-9 at 0, 2, and 6 months for ages 9-26 should be vaccinated.  Ages 75-45 should be offered if appropriate Tdap: give every 10 years Shingles: give Shingrix 2 dose series at 0 and 2-6 months if >50 years on ART with CD4 cell count >200 Varicella: primary vaccination may be considered in VZV seronegative persons aged >8 years (if CD4 >200)  MMR: vaccine should be given if born in 15 or after and do not have immunity (if CD4 >200)  Screening DEXA Scan: if age >68 Quantiferon: check at initiation of care Hepatitis C: check at initiation of care.  Screen annually if risk factors HLA B5701: check at initiation of care G6PD: check if starting therapy with oxidant drugs Lipids: check annually Urinalysis: check annually or every 6 months if on tenofovir Hgb A1c: check annually  ASCVD Risk Score Consider high-intensity statin therapy if 10-year ASCVD risk score >7.5% The ASCVD Risk score (Arnett DK, et al., 2019) failed to calculate for the following reasons:   The valid total cholesterol range is 130 to 320 mg/dL   Harold Crosby for Infectious Disease [facility] 02/06/2023, 10:53 AM  HIV: Harold Crosby presents today for routine HIV follow-up.   He was last seen in November 2022 at which time he was doing well with [facility] and reported no issues with adherence or missed doses.  He reports this remains the case today.  Will continue his Odefsey, send refills, and check lab work today.  Plan for follow-up in 12 months.  Diabetes: Patient is managed by his PCP for this issue.  His most recent hemoglobin A1c from last month was 10.3.  He is currently on high intensity statin.  CKD stage III: Creatinine last month was stable at or near baseline of 1.31.   Vaccines: Patient previously vaccinated for hepatitis A and hepatitis B.  Will repeat serologies today to ensure immunity and consider revaccination as needed.  Offered PCV 20 and flu shot today.  He ***.  STI/Screening: Screening offered today and he ***.  Encounter for medication monitoring: ***

## 2023-02-06 NOTE — Telephone Encounter (Signed)
Called patient to see if he would be able to make it to today's appointment, no answer on either number and no secure voicemail set up.  Beryle Flock, RN

## 2023-02-13 ENCOUNTER — Other Ambulatory Visit: Payer: Self-pay | Admitting: Internal Medicine

## 2023-02-13 ENCOUNTER — Ambulatory Visit (INDEPENDENT_AMBULATORY_CARE_PROVIDER_SITE_OTHER): Payer: Medicare HMO | Admitting: Physician Assistant

## 2023-02-13 ENCOUNTER — Encounter: Payer: Self-pay | Admitting: Physician Assistant

## 2023-02-13 ENCOUNTER — Other Ambulatory Visit: Payer: Self-pay

## 2023-02-13 VITALS — BP 106/72 | HR 79 | Temp 97.6°F | Ht 72.0 in | Wt 228.0 lb

## 2023-02-13 DIAGNOSIS — B20 Human immunodeficiency virus [HIV] disease: Secondary | ICD-10-CM

## 2023-02-13 DIAGNOSIS — Z09 Encounter for follow-up examination after completed treatment for conditions other than malignant neoplasm: Secondary | ICD-10-CM

## 2023-02-13 DIAGNOSIS — L02215 Cutaneous abscess of perineum: Secondary | ICD-10-CM

## 2023-02-13 NOTE — Progress Notes (Signed)
[facility] POST-OP OFFICE VISIT  02/13/2023  HPI: Harold Crosby is a 52 y.o. male 30 days s/p wide debridement of perineal abscess extending into the scrotum with Dr Dahlia Byes   Doing well overall Minimal pain Was unable to pack the wound for about a week given his wife was in the hospital Occasional serous drainage; minimal  No fever, chills Following good hygiene with BMs  Vital signs: BP 106/72   Pulse 79   Temp 97.6 F (36.4 C) (Oral)   Ht 6' (1.829 m)   Wt 228 lb (103.4 kg)   SpO2 98%   BMI 30.92 kg/m    Physical Exam: Constitutional: Well appearing male, NAD Skin: Chaperone present, wound to the perineum is healing well; no packing present, depth closing, the entire wound bed is healthy granulation tissue.   Assessment/Plan: This is a 52 y.o. male 30 days s/p wide debridement of perineal abscess extending into the scrotum with Dr Dahlia Byes    - Pain control prn  - Wound care: continue superficial dressings; no longer packing   - Continue to follow good hygiene especially after BM  - I will see him again in 3-4 weeks for wound check. He understands to call with questions/concerns in the interim.    -- Edison Simon, PA-C [facility] 02/13/2023, 2:18 PM M-F: 7am - 4pm

## 2023-02-13 NOTE — Patient Instructions (Signed)
Continue to wash the area with soap and water, especially after each bowel movement.

## 2023-02-22 ENCOUNTER — Other Ambulatory Visit: Payer: Self-pay | Admitting: Internal Medicine

## 2023-02-22 DIAGNOSIS — B20 Human immunodeficiency virus [HIV] disease: Secondary | ICD-10-CM

## 2023-02-28 ENCOUNTER — Telehealth: Payer: Self-pay | Admitting: *Deleted

## 2023-02-28 NOTE — Telephone Encounter (Signed)
Patient called and stated that he is getting another perirectal abscess and its starting to get painful, he has an appointment with Zach on 03/06/23 but he wanted to see if he can get another antibiotic called in to [facility] in [place]

## 2023-03-01 ENCOUNTER — Ambulatory Visit: Payer: Medicare HMO | Admitting: Physician Assistant

## 2023-03-01 ENCOUNTER — Encounter: Payer: Self-pay | Admitting: Physician Assistant

## 2023-03-01 VITALS — BP 107/72 | HR 92 | Temp 98.7°F | Ht 72.0 in | Wt 241.0 lb

## 2023-03-01 DIAGNOSIS — Z09 Encounter for follow-up examination after completed treatment for conditions other than malignant neoplasm: Secondary | ICD-10-CM

## 2023-03-01 DIAGNOSIS — L02214 Cutaneous abscess of groin: Secondary | ICD-10-CM

## 2023-03-01 MED ORDER — AMOXICILLIN-POT CLAVULANATE 875-125 MG PO TABS: 1.0000 | ORAL_TABLET | Freq: Two times a day (BID) | ORAL | 0 refills | Status: AC

## 2023-03-01 NOTE — Progress Notes (Signed)
Procedure Note  Date: 03/01/23 1:35 PM  Preforming Provider: Edison Simon, PA-C  Pre-Procedure Diagnosis: Right Inguinal Abscess  Post-Procedure Diagnosis: Same  Indications: Harold Crosby is a 52 y.o. male know to our service following peri-rectal abscess I&D about 6 weeks ago with Dr Dahlia Byes. He has done great from this standpoint and wound is healing without issue. He notes that about 7 days ago, he started to notice swelling and tenderness in the right groin. He thought this was initially a "boil" and tried to let it come to a head but this never occurred.   Anesthesia: 5 ccs of 1% lidocaine with epinephrine  Findings: Purulent Drainage   Details of Procedure:  All risks, benefits, and alternatives to above procedure(s) were discussed with the patient and informed consent was obtained. Right groin was prepped and draped in standard sterile fashion. 5 ccs of 1% lidocaine with epinephrine with injected intradermally and adequate anesthesia achieved. Using an 11 blade scalpel, an incision was made over area of fluctuance in right groin and purulent drainage was immediately encountered. This was cultured. The skin edges were then trimmed to allow for adequate drainage. All loculations were broken up with hemostat. The wound was then irrigated with copious amount of NS and packed with 1/4 inch plain packing and dressed. The patient tolerated this well without immediate complications. All sharps were accounted for an disposed of at the end of the procedure.   Complications: None apparent  Plan:  - Restart Augmentin for 10 days - Reviewed wound care instructions - Pain control prn - He has scheduled follow up on 03/12. Instructed to call sooner with questions/concerns  --  Edison Simon, PA-C [facility] 03/01/2023, 1:35 PM M-F: 7am - 4pm

## 2023-03-01 NOTE — Patient Instructions (Signed)
Today we have drained your Abscess in the office. The numbing medication will wear off in approximately 4-8 hours. You will have some pain to the area afterwards but should not be as severe as prior to the procedure.  If you have been given antibiotics, please continue to take them after your procedure.  Tomorrow when you shower remove the packing and let the warm soapy water run over the area, rinse well, and pat dry. Then you may repack the area, being sure to fill the whole space with the packing material, leave a tail out. Then cover with gauze and tape in place.  You may Tylenol or Ibuprofen for any pain or discomfort.   We will see you back as scheduled below.   If you have any questions or concerns prior to your appointment, please call our office and speak with a nurse.  Incision and Drainage Incision and drainage is a surgical procedure to open and drain a fluid-filled sac. The sac may be filled with pus, mucus, or blood. Examples of fluid-filled sacs that may need surgical drainage include cysts, skin infections (abscesses), and red lumps that develop from a ruptured cyst or a small abscess (boils). You may need this procedure if the affected area is large, painful, infected, or not healing well. Tell a health care provider about: Any allergies you have. All medicines you are taking, including vitamins, herbs, eye drops, creams, and over-the-counter medicines. Any problems you or family members have had with anesthetic medicines. Any blood disorders you have. Any surgeries you have had. Any medical conditions you have. Whether you are pregnant or may be pregnant. What are the risks? Generally, this is a safe procedure. However, problems may occur, including: Infection. Bleeding. Allergic reactions to medicines. Scarring.  What happens before the procedure? You may need an ultrasound or other imaging tests to see how large or deep the fluid-filled sac is. You may have blood  tests to check for infection. You may get a tetanus shot. You may be given antibiotic medicine to help prevent infection. Follow instructions from your health care provider about eating or drinking restrictions. Ask your health care provider about: Changing or stopping your regular medicines. This is especially important if you are taking diabetes medicines or blood thinners. Taking medicines such as aspirin and ibuprofen. These medicines can thin your blood. Do not take these medicines before your procedure if your health care provider instructs you not to. Plan to have someone take you home after the procedure. If you will be going home right after the procedure, plan to have someone stay with you for 24 hours. What happens during the procedure? To reduce your risk of infection: Your health care team will wash or sanitize their hands. Your skin will be washed with soap. You will be given one or more of the following: A medicine to help you relax (sedative). A medicine to numb the area (local anesthetic). A medicine to make you fall asleep (general anesthetic). An incision will be made in the top of the fluid-filled sac. The contents of the sac may be squeezed out, or a syringe or tube (catheter) may be used to empty the sac. The catheter may be left in place for several weeks to drain any fluid. Or, your health care provider may stitch open the edges of the incision to make a long-term opening for drainage (marsupialization). The inside of the sac may be washed out (irrigated) with a sterile solution and packed with gauze before  it is covered with a bandage (dressing). The procedure may vary among health care providers and hospitals. What happens after the procedure? Your blood pressure, heart rate, breathing rate, and blood oxygen level will be monitored often until the medicines you were given have worn off. Do not drive for 24 hours if you received a sedative. This information is not  intended to replace advice given to you by your health care provider. Make sure you discuss any questions you have with your health care provider. Document Released: 06/06/2001 Document Revised: 05/18/2016 Document Reviewed: 10/01/2015 Elsevier Interactive Patient Education  2017 [employer].   Incision and Drainage, Care After Refer to this sheet in the next few weeks. These instructions provide you with information about caring for yourself after your procedure. Your health care provider may also give you more specific instructions. Your treatment has been planned according to current medical practices, but problems sometimes occur. Call your health care provider if you have any problems or questions after your procedure. What can I expect after the procedure? After the procedure, it is common to have: Pain or discomfort around your incision site. Drainage from your incision.  Follow these instructions at home: Take over-the-counter and prescription medicines only as told by your health care provider. If you were prescribed an antibiotic medicine, take it as told by your health care provider. Do not stop taking the antibiotic even if you start to feel better. Follow instructions from your health care provider about: How to take care of your incision. When and how you should change your packing and bandage (dressing). Wash your hands with soap and water before you change your dressing. If soap and water are not available, use hand sanitizer. When you should remove your dressing. Do not take baths, swim, or use a hot tub until your health care provider approves. Keep all follow-up visits as told by your health care provider. This is important. Check your incision area every day for signs of infection. Check for: More redness, swelling, or pain. More fluid or blood. Warmth. Pus or a bad smell. Contact a health care provider if: Your cyst or abscess returns. You have a fever. You have  more redness, swelling, or pain around your incision. You have more fluid or blood coming from your incision. Your incision feels warm to the touch. You have pus or a bad smell coming from your incision. Get help right away if: You have severe pain or bleeding. You cannot eat or drink without vomiting. You have decreased urine output. You become short of breath. You have chest pain. You cough up blood. The area where the incision and drainage occurred becomes numb or it tingles. This information is not intended to replace advice given to you by your health care provider. Make sure you discuss any questions you have with your health care provider. Document Released: 03/04/2012 Document Revised: 05/12/2016 Document Reviewed: 10/01/2015 Elsevier Interactive Patient Education  2017 [employer].

## 2023-03-06 ENCOUNTER — Other Ambulatory Visit: Payer: Self-pay | Admitting: Internal Medicine

## 2023-03-06 ENCOUNTER — Encounter: Payer: Medicare HMO | Admitting: Physician Assistant

## 2023-03-06 DIAGNOSIS — B20 Human immunodeficiency virus [HIV] disease: Secondary | ICD-10-CM

## 2023-03-09 LAB — ANAEROBIC AND AEROBIC CULTURE

## 2023-03-27 ENCOUNTER — Ambulatory Visit: Payer: Medicare HMO | Admitting: Infectious Diseases

## 2023-04-23 ENCOUNTER — Encounter: Payer: Self-pay | Admitting: Infectious Disease

## 2023-04-23 ENCOUNTER — Other Ambulatory Visit: Payer: Self-pay

## 2023-04-23 ENCOUNTER — Ambulatory Visit (INDEPENDENT_AMBULATORY_CARE_PROVIDER_SITE_OTHER): Payer: Medicare HMO | Admitting: Infectious Disease

## 2023-04-23 VITALS — BP 137/87 | HR 82 | Temp 98.2°F | Ht 73.0 in | Wt 251.0 lb

## 2023-04-23 DIAGNOSIS — E782 Mixed hyperlipidemia: Secondary | ICD-10-CM

## 2023-04-23 DIAGNOSIS — N183 Chronic kidney disease, stage 3 unspecified: Secondary | ICD-10-CM

## 2023-04-23 DIAGNOSIS — I5032 Chronic diastolic (congestive) heart failure: Secondary | ICD-10-CM

## 2023-04-23 DIAGNOSIS — I2510 Atherosclerotic heart disease of native coronary artery without angina pectoris: Secondary | ICD-10-CM | POA: Diagnosis not present

## 2023-04-23 DIAGNOSIS — E1122 Type 2 diabetes mellitus with diabetic chronic kidney disease: Secondary | ICD-10-CM | POA: Diagnosis not present

## 2023-04-23 DIAGNOSIS — R739 Hyperglycemia, unspecified: Secondary | ICD-10-CM

## 2023-04-23 DIAGNOSIS — B20 Human immunodeficiency virus [HIV] disease: Secondary | ICD-10-CM

## 2023-04-23 DIAGNOSIS — E1165 Type 2 diabetes mellitus with hyperglycemia: Secondary | ICD-10-CM

## 2023-04-23 DIAGNOSIS — L02215 Cutaneous abscess of perineum: Secondary | ICD-10-CM

## 2023-04-23 DIAGNOSIS — F17210 Nicotine dependence, cigarettes, uncomplicated: Secondary | ICD-10-CM

## 2023-04-23 NOTE — Progress Notes (Signed)
Subjective:  Chief complaint follow-up for HIV disease  Patient ID: Harold Crosby, male    DOB: Jun 01, 1971, 52 y.o.   MRN: [id number]  HPI  52 year old Caucasian man with hx of HIV, having been previously followed by Dr. [name] and then also seen by Dr. Earlene Plater.  He has had HIV for more than a decade and was initially on Atripla ultimately followed by [facility].  Several notes in which the patient recounts feeling "better off of antivirals and this visit in which he told me he had been off of meds x 3 weeks he again mentioned an anectode of his CD4 having gone up when he came off of ARV's  I explained that when HIV is unchecked it will inevitably destroy CD4 cells and lead to AIDS so it is imperative to prevent that happening by consistent use of ARV without episodes of viremia.  Explained to him that the data from the smart study and start studies had shown that starting and stopping medicines is not good for individuals health and that even those with normal CD4 cells have better outcomes if they are on antiretrovirals versus not being on the.  He does know to take the [facility] with food (breakfast) and to not take PPI, H2 blockers. He rarely at night has had to take an [employer].   He does have comorbid IDDM and recently struggled with perineal abscess and perirectal abscess sp surgeries    Past Medical History:  Diagnosis Date   History of thrush    HIV (human immunodeficiency virus infection) (HCC)    Followed by Dr. [name] in ID clinic.    Hyperlipidemia    Hypertension    Insomnia    Tobacco abuse    Type II diabetes mellitus (HCC)     Past Surgical History:  Procedure Laterality Date   INCISION AND DRAINAGE ABSCESS N/A 01/14/2023   Procedure: INCISION AND DRAINAGE PERIRECTAL ABSCESS;  Surgeon: Leafy Ro, MD;  Location: [facility] ORS;  Service: General;  Laterality: N/A;    Family History  Problem Relation Age of Onset   Coronary artery disease Other    Diabetes  Other    Kidney failure Mother       Social History   Socioeconomic History   Marital status: Single    Spouse name: Not on file   Number of children: Not on file   Years of education: Not on file   Highest education level: Not on file  Occupational History   Not on file  Tobacco Use   Smoking status: Every Day    Packs/day: 1.00    Years: 20.00    Additional pack years: 0.00    Total pack years: 20.00    Types: Cigarettes    Passive exposure: Past   Smokeless tobacco: Never   Tobacco comments:    3-4 cigarettes a day   Substance and Sexual Activity   Alcohol use: No   Drug use: No   Sexual activity: Yes    Partners: Female    Comment: declined condoms  Other Topics Concern   Not on file  Social History Narrative   Same girlfriend x 7 years. No previous TFX. No previous male/male intercourse.  Un clear how many heterosexual partners he has had >10, <100.      [employer] approved till 03/25/11 Juanell Fairly benefits approved: patient eligible for 100% discount for outpatient lasbs and office visits. Patient available for no discount for other services. Britta Mccreedy  Hammond 11/11.   Social Determinants of Health   Financial Resource Strain: Not on file  Food Insecurity: No Food Insecurity (01/12/2023)   Hunger Vital Sign    Worried About Running Out of Food in the Last Year: Never true    Ran Out of Food in the Last Year: Never true  Transportation Needs: No Transportation Needs (01/12/2023)   PRAPARE - Administrator, Civil Service (Medical): No    Lack of Transportation (Non-Medical): No  Physical Activity: Not on file  Stress: Not on file  Social Connections: Not on file    Allergies  Allergen Reactions   Sulfonamide Derivatives Anaphylaxis    REACTION: skin welps, tongue swells, throat closes up     Current Outpatient Medications:    allopurinol (ZYLOPRIM) 100 MG tablet, Take 100 mg by mouth daily., Disp: , Rfl:    celecoxib (CELEBREX) 100 MG capsule,  Take 100 mg by mouth 2 (two) times daily as needed., Disp: , Rfl:    colchicine 0.6 MG tablet, Take 0.6 mg by mouth as needed., Disp: , Rfl:    finasteride (PROSCAR) 5 MG tablet, Take 5 mg by mouth daily., Disp: , Rfl:    furosemide (LASIX) 40 MG tablet, Take 80 mg by mouth 3 (three) times daily., Disp: , Rfl:    gabapentin (NEURONTIN) 600 MG tablet, Take 1,200 mg by mouth 3 (three) times daily., Disp: , Rfl:    HUMULIN R U-500 KWIKPEN 500 UNIT/ML KwikPen, Inject 100-200 Units into the skin 3 (three) times daily with meals., Disp: , Rfl:    ibuprofen (ADVIL) 800 MG tablet, Take 1 tablet (800 mg total) by mouth every 8 (eight) hours as needed for mild pain or moderate pain., Disp: 30 tablet, Rfl: 0   insulin glargine (LANTUS) 100 UNIT/ML injection, Inject 0.45 mLs (45 Units total) into the skin 2 (two) times daily., Disp: 15 mL, Rfl: 11   liraglutide (VICTOZA) 18 MG/3ML SOPN, Inject 1.8 mg into the skin daily., Disp: , Rfl:    metFORMIN (GLUCOPHAGE) 1000 MG tablet, Take 1 tablet (1,000 mg total) by mouth 2 (two) times daily with a meal., Disp: 60 tablet, Rfl: 2   ODEFSEY 200-25-25 MG TABS tablet, TAKE 1 TABLET BY MOUTH EVERY DAY WITH BREAKFAST, Disp: 30 tablet, Rfl: 0   oxyCODONE (OXY IR/ROXICODONE) 5 MG immediate release tablet, Take 1 tablet (5 mg total) by mouth every 6 (six) hours as needed for severe pain or breakthrough pain., Disp: 20 tablet, Rfl: 0   rosuvastatin (CRESTOR) 20 MG tablet, Take 1 tablet by mouth daily., Disp: , Rfl:    tiZANidine (ZANAFLEX) 4 MG tablet, Take 4 mg by mouth 3 (three) times daily., Disp: , Rfl:  No current facility-administered medications for this visit.  Facility-Administered Medications Ordered in Other Visits:    cyanocobalamin ((VITAMIN B-12)) injection 1,000 mcg, 1,000 mcg, Intramuscular, Once, Ginnie Smart, MD   Review of Systems  Constitutional:  Negative for activity change, appetite change, chills, diaphoresis, fatigue, fever and unexpected  weight change.  HENT:  Negative for congestion, rhinorrhea, sinus pressure, sneezing, sore throat and trouble swallowing.   Eyes:  Negative for photophobia and visual disturbance.  Respiratory:  Negative for cough, chest tightness, shortness of breath, wheezing and stridor.   Cardiovascular:  Negative for chest pain, palpitations and leg swelling.  Gastrointestinal:  Negative for abdominal distention, abdominal pain, anal bleeding, blood in stool, constipation, diarrhea, nausea and vomiting.  Genitourinary:  Negative for difficulty urinating, dysuria, flank  pain and hematuria.  Musculoskeletal:  Negative for arthralgias, back pain, gait problem, joint swelling and myalgias.  Skin:  Negative for color change, pallor, rash and wound.  Neurological:  Negative for dizziness, tremors, weakness, light-headedness and headaches.  Hematological:  Negative for adenopathy. Does not bruise/bleed easily.  Psychiatric/Behavioral:  Negative for agitation, behavioral problems, confusion, decreased concentration, dysphoric mood, sleep disturbance and suicidal ideas.        Objective:   Physical Exam Constitutional:      General: He is not in acute distress.    Appearance: Normal appearance. He is well-developed. He is not ill-appearing or diaphoretic.  HENT:     Head: Normocephalic and atraumatic.     Right Ear: Hearing and external ear normal.     Left Ear: Hearing and external ear normal.     Nose: No nasal deformity or rhinorrhea.  Eyes:     General: No scleral icterus.    Conjunctiva/sclera: Conjunctivae normal.     Right eye: Right conjunctiva is not injected.     Left eye: Left conjunctiva is not injected.     Pupils: Pupils are equal, round, and reactive to light.  Neck:     Vascular: No JVD.  Cardiovascular:     Rate and Rhythm: Normal rate and regular rhythm.     Heart sounds: S1 normal and S2 normal.  Pulmonary:     Effort: Pulmonary effort is normal. No respiratory distress.      Breath sounds: No wheezing.  Abdominal:     General: Bowel sounds are normal. There is no distension.     Palpations: Abdomen is soft.     Tenderness: There is no abdominal tenderness.  Musculoskeletal:        General: Normal range of motion.     Right shoulder: Normal.     Left shoulder: Normal.     Cervical back: Normal range of motion and neck supple.     Right hip: Normal.     Left hip: Normal.     Right knee: Normal.     Left knee: Normal.  Lymphadenopathy:     Head:     Right side of head: No submandibular, preauricular or posterior auricular adenopathy.     Left side of head: No submandibular, preauricular or posterior auricular adenopathy.     Cervical: No cervical adenopathy.     Right cervical: No superficial or deep cervical adenopathy.    Left cervical: No superficial or deep cervical adenopathy.  Skin:    General: Skin is warm and dry.     Coloration: Skin is not pale.     Findings: No abrasion, bruising, ecchymosis, erythema, lesion or rash.     Nails: There is no clubbing.  Neurological:     Mental Status: He is alert and oriented to person, place, and time.     Sensory: No sensory deficit.     Coordination: Coordination normal.     Gait: Gait normal.  Psychiatric:        Attention and Perception: He is attentive.        Speech: Speech normal.        Behavior: Behavior normal. Behavior is cooperative.        Thought Content: Thought content normal.        Judgment: Judgment normal.           Assessment & Plan:   HIV disease:  I will add order HIV viral load CD4 count CBC with differential CMP,  RPR GC and chlamydia and I will continue  Mar[facility] Curfman's Odefsey prescription  I will have him come back to clinic in 1 month's time.  I have put forward some ideas of other regimens that he could be on including BIKTARVY and Dovato  Peri-rectal abscess, inguinal abscess resolved  Hyperlipidemia: continue crestor  DM: would benefit from weight loss drug I  would think. He is currenlty on insulin  I have personally spent 42 minutes involved in face-to-face and non-face-to-face activities for this patient on the day of the visit. Professional time spent includes the following activities: Preparing to see the patient (review of tests), Obtaining and/or reviewing separately obtained history (admission/discharge record), Performing a medically appropriate examination and/or evaluation , Ordering medications/tests/procedures, referring and communicating with other health care professionals, Documenting clinical information in the EMR, Independently interpreting results (not separately reported), Communicating results to the patient/family/caregiver, Counseling and educating the patient/family/caregiver and Care coordination (not separately reported).

## 2023-04-24 LAB — T-HELPER CELLS (CD4) COUNT (NOT AT ARMC)
CD4 % Helper T Cell: 44 % (ref 33–65)
CD4 T Cell Abs: 408 /uL (ref 400–1790)

## 2023-04-30 ENCOUNTER — Telehealth: Payer: Self-pay

## 2023-04-30 DIAGNOSIS — B20 Human immunodeficiency virus [HIV] disease: Secondary | ICD-10-CM

## 2023-04-30 MED ORDER — ODEFSEY 200-25-25 MG PO TABS: ORAL_TABLET | ORAL | 0 refills | Status: DC

## 2023-04-30 NOTE — Telephone Encounter (Signed)
Called patient regarding lab results. Is scheduled for lab on 5/20. Will send in refill for odefsey.  Juanita Laster, RMA

## 2023-04-30 NOTE — Telephone Encounter (Signed)
-----   Message from Randall Hiss, MD sent at 04/27/2023 12:27 PM EDT ----- Regarding: FW: Nehal is viremic above 4K. Genotype pending. I would like him to come back in next 2-3 weeks to have repeat VL and also discuss a different regimen. Hopefully he does not have VF w R but we'll find out from genotype ----- Message ----- From: Interface, [facility] Lab Results In Sent: 04/23/2023  11:34 PM EDT To: Randall Hiss, MD

## 2023-05-12 LAB — COMPLETE METABOLIC PANEL WITH GFR
AG Ratio: 1.2 (calc) (ref 1.0–2.5)
ALT: 28 U/L (ref 9–46)
AST: 62 U/L — ABNORMAL HIGH (ref 10–35)
Albumin: 3.8 g/dL (ref 3.6–5.1)
Alkaline phosphatase (APISO): 101 U/L (ref 35–144)
BUN/Creatinine Ratio: 15 (calc) (ref 6–22)
BUN: 22 mg/dL (ref 7–25)
CO2: 31 mmol/L (ref 20–32)
Calcium: 9.4 mg/dL (ref 8.6–10.3)
Chloride: 100 mmol/L (ref 98–110)
Creat: 1.42 mg/dL — ABNORMAL HIGH (ref 0.70–1.30)
Globulin: 3.1 g/dL (calc) (ref 1.9–3.7)
Glucose, Bld: 200 mg/dL — ABNORMAL HIGH (ref 65–99)
Potassium: 4.3 mmol/L (ref 3.5–5.3)
Sodium: 141 mmol/L (ref 135–146)
Total Bilirubin: 0.2 mg/dL (ref 0.2–1.2)
Total Protein: 6.9 g/dL (ref 6.1–8.1)
eGFR: 59 mL/min/{1.73_m2} — ABNORMAL LOW (ref >=60)

## 2023-05-12 LAB — CBC WITH DIFFERENTIAL/PLATELET
Absolute Monocytes: 607 cells/uL (ref 200–950)
Basophils Absolute: 33 cells/uL (ref 0–200)
Basophils Relative: 0.5 %
Eosinophils Absolute: 152 cells/uL (ref 15–500)
Eosinophils Relative: 2.3 %
HCT: 42.4 % (ref 38.5–50.0)
Hemoglobin: 13.9 g/dL (ref 13.2–17.1)
Lymphs Abs: 957 cells/uL (ref 850–3900)
MCH: 32.3 pg (ref 27.0–33.0)
MCHC: 32.8 g/dL (ref 32.0–36.0)
MCV: 98.6 fL (ref 80.0–100.0)
MPV: 9.7 fL (ref 7.5–12.5)
Monocytes Relative: 9.2 %
Neutro Abs: 4851 cells/uL (ref 1500–7800)
Neutrophils Relative %: 73.5 %
Platelets: 265 10*3/uL (ref 140–400)
RBC: 4.3 10*6/uL (ref 4.20–5.80)
RDW: 13.3 % (ref 11.0–15.0)
Total Lymphocyte: 14.5 %
WBC: 6.6 10*3/uL (ref 3.8–10.8)

## 2023-05-12 LAB — HIV RNA, RTPCR W/R GT (RTI, PI,INT)
HIV 1 RNA Quant: 4210 copies/mL — ABNORMAL HIGH
HIV-1 RNA Quant, Log: 3.62 Log copies/mL — ABNORMAL HIGH

## 2023-05-12 LAB — HIV-1 INTEGRASE GENOTYPE

## 2023-05-12 LAB — LIPID PANEL
Cholesterol: 110 mg/dL (ref <200)
HDL: 32 mg/dL — ABNORMAL LOW (ref >=40)
LDL Cholesterol (Calc): 40 mg/dL (calc)
Non-HDL Cholesterol (Calc): 78 mg/dL (calc) (ref <130)
Total CHOL/HDL Ratio: 3.4 (calc) (ref <5.0)
Triglycerides: 361 mg/dL — ABNORMAL HIGH (ref <150)

## 2023-05-12 LAB — RPR: RPR Ser Ql: NONREACTIVE

## 2023-05-12 LAB — HIV-1 GENOTYPE: HIV-1 Genotype: DETECTED — AB

## 2023-05-12 LAB — HEPATITIS C AB W/RFL RNA, PCR + GENO: Hepatitis C Ab: NONREACTIVE

## 2023-05-14 ENCOUNTER — Other Ambulatory Visit (HOSPITAL_COMMUNITY)
Admission: RE | Admit: 2023-05-14 | Discharge: 2023-05-14 | Disposition: A | Discharge status: Home / Self Care | Payer: Medicare HMO | Source: Ambulatory Visit | Attending: Infectious Disease | Admitting: Infectious Disease

## 2023-05-14 ENCOUNTER — Other Ambulatory Visit: Payer: Medicare HMO

## 2023-05-14 ENCOUNTER — Other Ambulatory Visit: Payer: Self-pay

## 2023-05-14 DIAGNOSIS — Z113 Encounter for screening for infections with a predominantly sexual mode of transmission: Secondary | ICD-10-CM | POA: Diagnosis present

## 2023-05-14 DIAGNOSIS — Z79899 Other long term (current) drug therapy: Secondary | ICD-10-CM

## 2023-05-14 DIAGNOSIS — B20 Human immunodeficiency virus [HIV] disease: Secondary | ICD-10-CM

## 2023-05-15 LAB — URINE CYTOLOGY ANCILLARY ONLY
Chlamydia: NEGATIVE
Comment: NEGATIVE
Comment: NORMAL
Neisseria Gonorrhea: NEGATIVE

## 2023-05-17 ENCOUNTER — Other Ambulatory Visit: Payer: Self-pay | Admitting: Infectious Disease

## 2023-05-17 DIAGNOSIS — B20 Human immunodeficiency virus [HIV] disease: Secondary | ICD-10-CM

## 2023-05-17 LAB — CBC WITH DIFFERENTIAL/PLATELET
Absolute Monocytes: 558 cells/uL (ref 200–950)
Basophils Absolute: 18 cells/uL (ref 0–200)
Basophils Relative: 0.3 %
Eosinophils Absolute: 78 cells/uL (ref 15–500)
Eosinophils Relative: 1.3 %
HCT: 43.2 % (ref 38.5–50.0)
Hemoglobin: 14.3 g/dL (ref 13.2–17.1)
Lymphs Abs: 1392 cells/uL (ref 850–3900)
MCH: 32.1 pg (ref 27.0–33.0)
MCHC: 33.1 g/dL (ref 32.0–36.0)
MCV: 96.9 fL (ref 80.0–100.0)
MPV: 10.1 fL (ref 7.5–12.5)
Monocytes Relative: 9.3 %
Neutro Abs: 3954 cells/uL (ref 1500–7800)
Neutrophils Relative %: 65.9 %
Platelets: 212 10*3/uL (ref 140–400)
RBC: 4.46 10*6/uL (ref 4.20–5.80)
RDW: 13.2 % (ref 11.0–15.0)
Total Lymphocyte: 23.2 %
WBC: 6 10*3/uL (ref 3.8–10.8)

## 2023-05-17 LAB — COMPLETE METABOLIC PANEL WITH GFR
AG Ratio: 1.4 (calc) (ref 1.0–2.5)
ALT: 28 U/L (ref 9–46)
AST: 49 U/L — ABNORMAL HIGH (ref 10–35)
Albumin: 4.2 g/dL (ref 3.6–5.1)
Alkaline phosphatase (APISO): 97 U/L (ref 35–144)
BUN/Creatinine Ratio: 12 (calc) (ref 6–22)
BUN: 18 mg/dL (ref 7–25)
CO2: 30 mmol/L (ref 20–32)
Calcium: 9.6 mg/dL (ref 8.6–10.3)
Chloride: 101 mmol/L (ref 98–110)
Creat: 1.46 mg/dL — ABNORMAL HIGH (ref 0.70–1.30)
Globulin: 3.1 g/dL (calc) (ref 1.9–3.7)
Glucose, Bld: 145 mg/dL — ABNORMAL HIGH (ref 65–99)
Potassium: 4 mmol/L (ref 3.5–5.3)
Sodium: 142 mmol/L (ref 135–146)
Total Bilirubin: 0.3 mg/dL (ref 0.2–1.2)
Total Protein: 7.3 g/dL (ref 6.1–8.1)
eGFR: 57 mL/min/{1.73_m2} — ABNORMAL LOW (ref >=60)

## 2023-05-17 LAB — HIV-1 RNA QUANT-NO REFLEX-BLD
HIV 1 RNA Quant: 21900 Copies/mL — ABNORMAL HIGH
HIV-1 RNA Quant, Log: 4.34 Log cps/mL — ABNORMAL HIGH

## 2023-05-17 LAB — RPR: RPR Ser Ql: NONREACTIVE

## 2023-05-18 ENCOUNTER — Telehealth: Payer: Self-pay

## 2023-05-18 DIAGNOSIS — B20 Human immunodeficiency virus [HIV] disease: Secondary | ICD-10-CM

## 2023-05-18 NOTE — Telephone Encounter (Signed)
-----   Message from Randall Hiss, MD sent at 05/17/2023 11:20 PM EDT ----- Regarding: pt viremic again-- not taking meds consistently= wrong pt for [facility]. Need to add a genotype to his recent blood draw Can we add a genotype to his recent blood draw--if not can he come in and have draw for genotype ASAP?  Once we have genotype "cooking" w results pending we need to have him stop his Charlett Lango whic his not a drug to take holidays from as he appears to be doing. We need to DC it and he needs to get on a modern drug with a high barrier to R such as Biktarvy, Symtuza or Dovato. Charlett Lango is really not ok with a patient like this ----- Message ----- From: Interface, [facility] Lab Results In Sent: 05/14/2023  11:30 PM EDT To: Randall Hiss, MD

## 2023-05-18 NOTE — Telephone Encounter (Signed)
Lab unable to add on genotype.   Spoke with Harold Crosby and discussed elevated viral load and concern for resistance. He was off of his Charlett Lango for about a month and just started taking it again 1 week ago.   He knows to stop his [facility] after his lab appointment.   [name], RN

## 2023-05-22 ENCOUNTER — Other Ambulatory Visit: Payer: Self-pay

## 2023-05-22 ENCOUNTER — Other Ambulatory Visit: Payer: Medicare HMO

## 2023-05-22 DIAGNOSIS — B20 Human immunodeficiency virus [HIV] disease: Secondary | ICD-10-CM

## 2023-05-23 ENCOUNTER — Other Ambulatory Visit: Payer: Medicare HMO

## 2023-05-23 NOTE — Telephone Encounter (Signed)
Harold Crosby had genotype drawn yesterday. Called to remind him to make sure he stops his Odefsey while results are processing, no answer. Left HIPAA compliant voicemail requesting callback.   [name], RN

## 2023-05-25 NOTE — Telephone Encounter (Signed)
Spoke with Malika, reminded him to stop his Odefsey for the time being while we await his genotype results. Reminded him of follow up in June.   [name], RN

## 2023-05-31 ENCOUNTER — Ambulatory Visit: Payer: Medicare HMO | Admitting: Infectious Disease

## 2023-06-02 LAB — HIV-1 INTEGRASE GENOTYPE

## 2023-06-02 LAB — HIV-1 GENOTYPING (RTI,PI,IN INHBTR)
Date Viral Load Collected: 5282024
HIV-1 Genotype: DETECTED — AB

## 2023-06-02 LAB — HIV RNA, RTPCR W/R GT (RTI, PI,INT)
HIV 1 RNA Quant: 3540 copies/mL — ABNORMAL HIGH
HIV-1 RNA Quant, Log: 3.55 Log copies/mL — ABNORMAL HIGH

## 2023-06-04 NOTE — Telephone Encounter (Signed)
Patient called stating that he is having diarrhea and he is currently off of odefsey. Patient stated that is why he is having diarrhea. He is taking up to 10 imodium a day with no relief. Please advise.

## 2023-06-05 ENCOUNTER — Other Ambulatory Visit (HOSPITAL_COMMUNITY): Payer: Self-pay

## 2023-06-05 ENCOUNTER — Other Ambulatory Visit: Payer: Self-pay | Admitting: Pharmacist

## 2023-06-05 DIAGNOSIS — B20 Human immunodeficiency virus [HIV] disease: Secondary | ICD-10-CM

## 2023-06-05 MED ORDER — BICTEGRAVIR-EMTRICITAB-TENOFOV 50-200-25 MG PO TABS
1.0000 | ORAL_TABLET | Freq: Every day | ORAL | 0 refills | Status: DC
Start: 2023-06-05 — End: 2023-06-14

## 2023-06-05 NOTE — Telephone Encounter (Signed)
Called patient to discuss plan; agrees to starting Biktarvy while awaiting genotype results. Discussed that stopping Charlett Lango should not be causing diarrhea - unsure what is causing his diarrhea, but reviewed it is likely multifactorial. Patient verbalized understanding. Will see Dr. Daiva Eves next week. Script sent to [facility]; cost covered as patient has SPAP. Marchelle Folks

## 2023-06-13 NOTE — Progress Notes (Unsigned)
Subjective:  Chief complaint: followup for HIV disease   Patient ID: Harold Crosby, male    DOB: 01/26/1971, 52 y.o.   MRN: [id number]  HPI  52 year old Caucasian man with hx of HIV, having been previously followed by Dr. [name] and then also seen by Dr. Earlene Plater.  He has had HIV for more than a decade and was initially on Atripla ultimately followed by [facility].  Several notes in which the patient recounts feeling "better off of antivirals and this visit in which he told me he had been off of meds x 3 weeks he again mentioned an anectode of his CD4 having gone up when he came off of ARV's  I explained that when HIV is unchecked it will inevitably destroy CD4 cells and lead to AIDS so it is imperative to prevent that happening by consistent use of ARV without episodes of viremia.      Past Medical History:  Diagnosis Date   History of thrush    HIV (human immunodeficiency virus infection) (HCC)    Followed by Dr. [name] in ID clinic.    Hyperlipidemia    Hypertension    Insomnia    Tobacco abuse    Type II diabetes mellitus (HCC)     Past Surgical History:  Procedure Laterality Date   INCISION AND DRAINAGE ABSCESS N/A 01/14/2023   Procedure: INCISION AND DRAINAGE PERIRECTAL ABSCESS;  Surgeon: Leafy Ro, MD;  Location: [facility] ORS;  Service: General;  Laterality: N/A;    Family History  Problem Relation Age of Onset   Coronary artery disease Other    Diabetes Other    Kidney failure Mother       Social History   Socioeconomic History   Marital status: Single    Spouse name: Not on file   Number of children: Not on file   Years of education: Not on file   Highest education level: Not on file  Occupational History   Not on file  Tobacco Use   Smoking status: Every Day    Packs/day: 1.00    Years: 20.00    Additional pack years: 0.00    Total pack years: 20.00    Types: Cigarettes    Passive exposure: Past   Smokeless tobacco: Never   Tobacco comments:     3-4 cigarettes a day   Substance and Sexual Activity   Alcohol use: No   Drug use: No   Sexual activity: Yes    Partners: Female    Comment: declined condoms  Other Topics Concern   Not on file  Social History Narrative   Same girlfriend x 7 years. No previous TFX. No previous male/male intercourse.  Un clear how many heterosexual partners he has had >10, <100.      [employer] approved till 03/25/11 Juanell Fairly benefits approved: patient eligible for 100% discount for outpatient lasbs and office visits. Patient available for no discount for other services. Kandice Robinsons 11/11.   Social Determinants of Health   Financial Resource Strain: Not on file  Food Insecurity: No Food Insecurity (01/12/2023)   Hunger Vital Sign    Worried About Running Out of Food in the Last Year: Never true    Ran Out of Food in the Last Year: Never true  Transportation Needs: No Transportation Needs (01/12/2023)   PRAPARE - Administrator, Civil Service (Medical): No    Lack of Transportation (Non-Medical): No  Physical Activity: Not on file  Stress:  Not on file  Social Connections: Not on file    Allergies  Allergen Reactions   Sulfonamide Derivatives Anaphylaxis    REACTION: skin welps, tongue swells, throat closes up     Current Outpatient Medications:    allopurinol (ZYLOPRIM) 100 MG tablet, Take 100 mg by mouth daily., Disp: , Rfl:    bictegravir-emtricitabine-tenofovir AF (BIKTARVY) 50-200-25 MG TABS tablet, Take 1 tablet by mouth daily., Disp: 30 tablet, Rfl: 0   celecoxib (CELEBREX) 100 MG capsule, Take 100 mg by mouth 2 (two) times daily as needed., Disp: , Rfl:    colchicine 0.6 MG tablet, Take 0.6 mg by mouth as needed., Disp: , Rfl:    Continuous Glucose Receiver (FREESTYLE LIBRE 3 READER) DEVI, , Disp: , Rfl:    empagliflozin (JARDIANCE) 25 MG TABS tablet, , Disp: , Rfl:    finasteride (PROSCAR) 5 MG tablet, Take 5 mg by mouth daily., Disp: , Rfl:    furosemide (LASIX) 40 MG  tablet, Take 80 mg by mouth 3 (three) times daily., Disp: , Rfl:    gabapentin (NEURONTIN) 600 MG tablet, Take 1,200 mg by mouth 3 (three) times daily., Disp: , Rfl:    HUMULIN R U-500 KWIKPEN 500 UNIT/ML KwikPen, Inject 100-200 Units into the skin 3 (three) times daily with meals., Disp: , Rfl:    insulin glargine (LANTUS) 100 UNIT/ML injection, Inject 0.45 mLs (45 Units total) into the skin 2 (two) times daily., Disp: 15 mL, Rfl: 11   liraglutide (VICTOZA) 18 MG/3ML SOPN, Inject 1.8 mg into the skin daily., Disp: , Rfl:    lisinopril (ZESTRIL) 5 MG tablet, Take by mouth., Disp: , Rfl:    metFORMIN (GLUCOPHAGE) 1000 MG tablet, Take 1 tablet (1,000 mg total) by mouth 2 (two) times daily with a meal., Disp: 60 tablet, Rfl: 2   oxyCODONE (OXY IR/ROXICODONE) 5 MG immediate release tablet, Take 1 tablet (5 mg total) by mouth every 6 (six) hours as needed for severe pain or breakthrough pain. (Patient not taking: Reported on 04/23/2023), Disp: 20 tablet, Rfl: 0   rosuvastatin (CRESTOR) 20 MG tablet, Take 1 tablet by mouth daily., Disp: , Rfl:    tiZANidine (ZANAFLEX) 4 MG tablet, Take 4 mg by mouth 3 (three) times daily. (Patient not taking: Reported on 04/23/2023), Disp: , Rfl:  No current facility-administered medications for this visit.  Facility-Administered Medications Ordered in Other Visits:    cyanocobalamin ((VITAMIN B-12)) injection 1,000 mcg, 1,000 mcg, Intramuscular, Once, [name] [name], MD   Review of Systems     Objective:   Physical Exam        Assessment & Plan:         Hyperlipidemia: continue crestor  DM: w

## 2023-06-14 ENCOUNTER — Encounter: Payer: Self-pay | Admitting: Infectious Disease

## 2023-06-14 ENCOUNTER — Other Ambulatory Visit: Payer: Self-pay

## 2023-06-14 ENCOUNTER — Ambulatory Visit: Payer: Medicare HMO | Admitting: Infectious Disease

## 2023-06-14 VITALS — BP 121/77 | HR 78 | Temp 97.3°F | Resp 16 | Wt 265.2 lb

## 2023-06-14 DIAGNOSIS — B20 Human immunodeficiency virus [HIV] disease: Secondary | ICD-10-CM

## 2023-06-14 DIAGNOSIS — Z7984 Long term (current) use of oral hypoglycemic drugs: Secondary | ICD-10-CM

## 2023-06-14 DIAGNOSIS — I10 Essential (primary) hypertension: Secondary | ICD-10-CM

## 2023-06-14 DIAGNOSIS — E782 Mixed hyperlipidemia: Secondary | ICD-10-CM | POA: Diagnosis not present

## 2023-06-14 DIAGNOSIS — E1122 Type 2 diabetes mellitus with diabetic chronic kidney disease: Secondary | ICD-10-CM

## 2023-06-14 DIAGNOSIS — E119 Type 2 diabetes mellitus without complications: Secondary | ICD-10-CM | POA: Diagnosis not present

## 2023-06-14 LAB — COMPLETE METABOLIC PANEL WITH GFR
AST: 49 U/L — ABNORMAL HIGH (ref 10–35)
BUN: 12 mg/dL (ref 7–25)
Glucose, Bld: 84 mg/dL (ref 65–99)
Potassium: 3.2 mmol/L — ABNORMAL LOW (ref 3.5–5.3)
Sodium: 143 mmol/L (ref 135–146)
Total Bilirubin: 0.2 mg/dL (ref 0.2–1.2)

## 2023-06-14 MED ORDER — BICTEGRAVIR-EMTRICITAB-TENOFOV 50-200-25 MG PO TABS: 1.0000 | ORAL_TABLET | Freq: Every day | ORAL | 11 refills | Status: DC

## 2023-06-15 LAB — T-HELPER CELLS (CD4) COUNT (NOT AT ARMC): Total lymphocyte count: 1323 cells/uL (ref 850–3900)

## 2023-06-16 LAB — COMPLETE METABOLIC PANEL WITH GFR
AG Ratio: 1.4 (calc) (ref 1.0–2.5)
ALT: 37 U/L (ref 9–46)
Albumin: 3.7 g/dL (ref 3.6–5.1)
Alkaline phosphatase (APISO): 84 U/L (ref 35–144)
BUN/Creatinine Ratio: 9 (calc) (ref 6–22)
CO2: 34 mmol/L — ABNORMAL HIGH (ref 20–32)
Calcium: 9.3 mg/dL (ref 8.6–10.3)
Chloride: 104 mmol/L (ref 98–110)
Creat: 1.32 mg/dL — ABNORMAL HIGH (ref 0.70–1.30)
Globulin: 2.7 g/dL (calc) (ref 1.9–3.7)
Total Protein: 6.4 g/dL (ref 6.1–8.1)
eGFR: 64 mL/min/{1.73_m2} (ref >=60)

## 2023-06-16 LAB — HIV-1 RNA QUANT-NO REFLEX-BLD
HIV 1 RNA Quant: 98 Copies/mL — ABNORMAL HIGH
HIV-1 RNA Quant, Log: 1.99 Log cps/mL — ABNORMAL HIGH

## 2023-06-16 LAB — T-HELPER CELLS (CD4) COUNT (NOT AT ARMC)
Absolute CD4: 559 cells/uL (ref 490–1740)
CD4 T Helper %: 42 % (ref 30–61)

## 2023-07-26 ENCOUNTER — Encounter: Admission: RE | Payer: Self-pay | Source: Home / Self Care

## 2023-07-26 ENCOUNTER — Ambulatory Visit: Admission: RE | Admit: 2023-07-26 | Payer: Medicare HMO | Source: Home / Self Care | Admitting: Gastroenterology

## 2023-07-26 SURGERY — COLONOSCOPY WITH PROPOFOL
Anesthesia: General

## 2023-09-05 ENCOUNTER — Ambulatory Visit: Payer: Medicare HMO | Admitting: Podiatry

## 2023-09-05 DIAGNOSIS — Z794 Long term (current) use of insulin: Secondary | ICD-10-CM | POA: Diagnosis not present

## 2023-09-05 DIAGNOSIS — I739 Peripheral vascular disease, unspecified: Secondary | ICD-10-CM | POA: Diagnosis not present

## 2023-09-05 DIAGNOSIS — E119 Type 2 diabetes mellitus without complications: Secondary | ICD-10-CM | POA: Diagnosis not present

## 2023-09-05 DIAGNOSIS — L89890 Pressure ulcer of other site, unstageable: Secondary | ICD-10-CM | POA: Diagnosis not present

## 2023-09-05 MED ORDER — DOXYCYCLINE HYCLATE 100 MG PO TABS: 100.0000 mg | ORAL_TABLET | Freq: Two times a day (BID) | ORAL | 0 refills | Status: AC

## 2023-09-05 NOTE — Progress Notes (Signed)
Subjective:  Patient ID: Harold Crosby, male    DOB: May 18, 1971,  MRN: [id number]  Chief Complaint  Patient presents with   Toe Pain    52 y.o. male presents with the above complaint.  Patient presents with left hallux distal tip eschar.  Patient states that just came out of nowhere.  He had ABIs done at [facility] which was within normal limits.  He does have history of smoking.  He has not seen anyone else prior to seeing me he denies any other acute complaints.  Wanted to discuss treatment options for is not clinically infected.  Currently not taking any antibiotics.   Review of Systems: Negative except as noted in the HPI. Denies N/V/F/Ch.  Past Medical History:  Diagnosis Date   History of thrush    HIV (human immunodeficiency virus infection) (HCC)    Followed by Dr. [name] in ID clinic.    Hyperlipidemia    Hypertension    Insomnia    Tobacco abuse    Type II diabetes mellitus (HCC)     Current Outpatient Medications:    doxycycline (VIBRA-TABS) 100 MG tablet, Take 1 tablet (100 mg total) by mouth 2 (two) times daily., Disp: 60 tablet, Rfl: 0   allopurinol (ZYLOPRIM) 100 MG tablet, Take 100 mg by mouth daily., Disp: , Rfl:    bictegravir-emtricitabine-tenofovir AF (BIKTARVY) 50-200-25 MG TABS tablet, Take 1 tablet by mouth daily., Disp: 30 tablet, Rfl: 11   celecoxib (CELEBREX) 100 MG capsule, Take 100 mg by mouth 2 (two) times daily as needed., Disp: , Rfl:    colchicine 0.6 MG tablet, Take 0.6 mg by mouth as needed., Disp: , Rfl:    Continuous Glucose Receiver (FREESTYLE LIBRE 3 READER) DEVI, , Disp: , Rfl:    empagliflozin (JARDIANCE) 25 MG TABS tablet, , Disp: , Rfl:    finasteride (PROSCAR) 5 MG tablet, Take 5 mg by mouth daily., Disp: , Rfl:    furosemide (LASIX) 40 MG tablet, Take 80 mg by mouth 3 (three) times daily., Disp: , Rfl:    gabapentin (NEURONTIN) 600 MG tablet, Take 1,200 mg by mouth 3 (three) times daily., Disp: , Rfl:    HUMULIN R U-500 KWIKPEN 500 UNIT/ML  KwikPen, Inject 100-200 Units into the skin 3 (three) times daily with meals., Disp: , Rfl:    insulin glargine (LANTUS) 100 UNIT/ML injection, Inject 0.45 mLs (45 Units total) into the skin 2 (two) times daily., Disp: 15 mL, Rfl: 11   liraglutide (VICTOZA) 18 MG/3ML SOPN, Inject 1.8 mg into the skin daily. (Patient not taking: Reported on 07/19/2023), Disp: , Rfl:    lisinopril (ZESTRIL) 5 MG tablet, Take by mouth., Disp: , Rfl:    metFORMIN (GLUCOPHAGE) 1000 MG tablet, Take 1 tablet (1,000 mg total) by mouth 2 (two) times daily with a meal., Disp: 60 tablet, Rfl: 2   oxyCODONE (OXY IR/ROXICODONE) 5 MG immediate release tablet, Take 1 tablet (5 mg total) by mouth every 6 (six) hours as needed for severe pain or breakthrough pain. (Patient not taking: Reported on 04/23/2023), Disp: 20 tablet, Rfl: 0   rosuvastatin (CRESTOR) 20 MG tablet, Take 1 tablet by mouth daily., Disp: , Rfl:    tiZANidine (ZANAFLEX) 4 MG tablet, Take 4 mg by mouth 3 (three) times daily., Disp: , Rfl:  No current facility-administered medications for this visit.  Facility-Administered Medications Ordered in Other Visits:    cyanocobalamin ((VITAMIN B-12)) injection 1,000 mcg, 1,000 mcg, Intramuscular, Once, [name], [name], MD  Social  History   Tobacco Use  Smoking Status Every Day   Current packs/day: 1.00   Average packs/day: 1 pack/day for 20.0 years (20.0 ttl pk-yrs)   Types: Cigarettes   Passive exposure: Past  Smokeless Tobacco Never  Tobacco Comments   3-4 cigarettes a day     Allergies  Allergen Reactions   Sulfonamide Derivatives Anaphylaxis    REACTION: skin welps, tongue swells, throat closes up   Objective:  There were no vitals filed for this visit. There is no height or weight on file to calculate BMI. Constitutional Well developed. Well nourished.  Vascular Dorsalis pedis pulses palpable bilaterally. Posterior tibial pulses palpable bilaterally. Capillary refill normal to all digits.  No  cyanosis or clubbing noted. Pedal hair growth normal.  Neurologic Normal speech. Oriented to person, place, and time. Epicritic sensation to light touch grossly present bilaterally.  Dermatologic Left hallux discussed.  Eschar.  Heart base noted.  No soft base noted.  No purulent drainage noted no mild erythema noted.  No malodor present.  Orthopedic: Normal joint ROM without pain or crepitus bilaterally. No visible deformities. No bony tenderness.   Radiographs: None Assessment:   1. Pressure ulcer of toe of left foot, unstageable (HCC)   2. Type 2 diabetes mellitus without complication, with long-term current use of insulin (HCC)   3. Peripheral vascular disease (HCC)    Plan:  Patient was evaluated and treated and all questions answered.  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  His ABIs are within normal limits which was done at [facility] recently. -If there is no improvement patient will benefit from vascular surgery consultation -Betadine wet-to-dry dressing  No follow-ups on file.  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

## 2023-09-07 ENCOUNTER — Ambulatory Visit: Payer: Medicare HMO | Admitting: Podiatry

## 2023-09-26 ENCOUNTER — Ambulatory Visit: Payer: Medicare HMO | Admitting: Podiatry

## 2023-09-26 ENCOUNTER — Other Ambulatory Visit: Payer: Self-pay | Admitting: Podiatry

## 2023-09-27 NOTE — Telephone Encounter (Signed)
Patient needs an appointment for any antibiotic refill requests.

## 2023-10-03 ENCOUNTER — Encounter: Payer: Self-pay | Admitting: Podiatry

## 2023-10-03 ENCOUNTER — Ambulatory Visit: Payer: Medicare HMO | Admitting: Podiatry

## 2023-10-03 VITALS — BP 136/83 | HR 93

## 2023-10-03 DIAGNOSIS — Z794 Long term (current) use of insulin: Secondary | ICD-10-CM

## 2023-10-03 DIAGNOSIS — L89890 Pressure ulcer of other site, unstageable: Secondary | ICD-10-CM | POA: Diagnosis not present

## 2023-10-03 DIAGNOSIS — E119 Type 2 diabetes mellitus without complications: Secondary | ICD-10-CM | POA: Diagnosis not present

## 2023-10-03 NOTE — Progress Notes (Signed)
Subjective:  Patient ID: Harold Crosby, male    DOB: Apr 26, 1971,  MRN: [id number]  Chief Complaint  Patient presents with   Wound Check    ' Seems to be fine to me "    52 y.o. male presents with the above complai patient presents for follow-up to left hallux distal tip eschar.  Patient states that he had ABIs done at [facility] which was within normal limits.  He denies any other acute complaints he does smoke.  He also does not have any issues that he knows about to the left foot.  Review of Systems: Negative except as noted in the HPI. Denies N/V/F/Ch.  Past Medical History:  Diagnosis Date   History of thrush    HIV (human immunodeficiency virus infection) (HCC)    Followed by Dr. [name] in ID clinic.    Hyperlipidemia    Hypertension    Insomnia    Tobacco abuse    Type II diabetes mellitus (HCC)     Current Outpatient Medications:    allopurinol (ZYLOPRIM) 100 MG tablet, Take 100 mg by mouth daily., Disp: , Rfl:    bictegravir-emtricitabine-tenofovir AF (BIKTARVY) 50-200-25 MG TABS tablet, Take 1 tablet by mouth daily., Disp: 30 tablet, Rfl: 11   colchicine 0.6 MG tablet, Take 0.6 mg by mouth as needed., Disp: , Rfl:    Continuous Glucose Receiver (FREESTYLE LIBRE 3 READER) DEVI, , Disp: , Rfl:    doxycycline (VIBRA-TABS) 100 MG tablet, Take 1 tablet (100 mg total) by mouth 2 (two) times daily., Disp: 60 tablet, Rfl: 0   empagliflozin (JARDIANCE) 25 MG TABS tablet, , Disp: , Rfl:    finasteride (PROSCAR) 5 MG tablet, Take 5 mg by mouth daily., Disp: , Rfl:    furosemide (LASIX) 40 MG tablet, Take 80 mg by mouth 3 (three) times daily., Disp: , Rfl:    gabapentin (NEURONTIN) 600 MG tablet, Take 1,200 mg by mouth 3 (three) times daily., Disp: , Rfl:    HUMULIN R U-500 KWIKPEN 500 UNIT/ML KwikPen, Inject 100-200 Units into the skin 3 (three) times daily with meals., Disp: , Rfl:    insulin glargine (LANTUS) 100 UNIT/ML injection, Inject 0.45 mLs (45 Units total) into the skin 2 (two)  times daily., Disp: 15 mL, Rfl: 11   liraglutide (VICTOZA) 18 MG/3ML SOPN, Inject 1.8 mg into the skin daily., Disp: , Rfl:    lisinopril (ZESTRIL) 5 MG tablet, Take by mouth., Disp: , Rfl:    metFORMIN (GLUCOPHAGE) 1000 MG tablet, Take 1 tablet (1,000 mg total) by mouth 2 (two) times daily with a meal., Disp: 60 tablet, Rfl: 2   rosuvastatin (CRESTOR) 20 MG tablet, Take 1 tablet by mouth daily., Disp: , Rfl:    tiZANidine (ZANAFLEX) 4 MG tablet, Take 4 mg by mouth 3 (three) times daily., Disp: , Rfl:    celecoxib (CELEBREX) 100 MG capsule, Take 100 mg by mouth 2 (two) times daily as needed. (Patient not taking: Reported on 10/03/2023), Disp: , Rfl:    oxyCODONE (OXY IR/ROXICODONE) 5 MG immediate release tablet, Take 1 tablet (5 mg total) by mouth every 6 (six) hours as needed for severe pain or breakthrough pain. (Patient not taking: Reported on 10/03/2023), Disp: 20 tablet, Rfl: 0 No current facility-administered medications for this visit.  Facility-Administered Medications Ordered in Other Visits:    cyanocobalamin ((VITAMIN B-12)) injection 1,000 mcg, 1,000 mcg, Intramuscular, Once, [name] [name], MD  Social History   Tobacco Use  Smoking Status Every Day  Current packs/day: 0.10   Average packs/day: 0.1 packs/day for 20.0 years (2.0 ttl pk-yrs)   Types: Cigarettes   Passive exposure: Past  Smokeless Tobacco Never  Tobacco Comments   3-4 cigarettes a day     Allergies  Allergen Reactions   Sulfonamide Derivatives Anaphylaxis    REACTION: skin welps, tongue swells, throat closes up   Objective:   Vitals:   10/03/23 1112  BP: 136/83  Pulse: 93   There is no height or weight on file to calculate BMI. Constitutional Well developed. Well nourished.  Vascular Dorsalis pedis pulses palpable bilaterally. Posterior tibial pulses palpable bilaterally. Capillary refill normal to all digits.  No cyanosis or clubbing noted. Pedal hair growth normal.  Neurologic Normal  speech. Oriented to person, place, and time. Epicritic sensation to light touch grossly present bilaterally.  Dermatologic Left hallux discussed.  Eschar.  Heart base note some soft base noted.  No purulent drainage noted no mild erythema noted.  No malodor present.  Orthopedic: Normal joint ROM without pain or crepitus bilaterally. No visible deformities. No bony tenderness.   Radiographs: None Assessment:   No diagnosis found.  Plan:  Patient was evaluated and treated and all questions answered.  Left hallux distal tip eschar with possible underlying microvascular disease -All questions and concerns were discussed with the patient in extensive detail -Clinically doing much better.  Betadine has been keeping the incision dry.  Not completely reepithelialized.  Continue Betadine to dry dressing continues a surgical shoe.  c

## 2023-10-31 ENCOUNTER — Ambulatory Visit: Payer: Medicare HMO | Admitting: Podiatry

## 2023-11-16 ENCOUNTER — Encounter: Payer: Self-pay | Admitting: *Deleted

## 2023-12-05 ENCOUNTER — Ambulatory Visit: Payer: Medicare HMO | Admitting: Podiatry

## 2024-01-07 ENCOUNTER — Telehealth: Payer: Self-pay

## 2024-01-07 NOTE — Telephone Encounter (Signed)
 Called pt left message advising it was time to fill out financial application to please call [phone]---deanna mabe/rcid

## 2024-01-09 ENCOUNTER — Other Ambulatory Visit (HOSPITAL_COMMUNITY): Payer: Self-pay

## 2024-01-30 ENCOUNTER — Ambulatory Visit: Payer: Medicare HMO | Admitting: Dermatology

## 2024-03-03 ENCOUNTER — Telehealth (HOSPITAL_COMMUNITY): Payer: Self-pay | Admitting: *Deleted

## 2024-03-03 NOTE — Telephone Encounter (Signed)
 PT returned call.  Gave general information regarding the exercise program.  Pt declines to participate due to spinal cord injury and back pain.  Unable to walk as well as exercise. Very limited.  Will attach referral to chart in the event circumstances should change. Alanson Aly, BSN Cardiac and Emergency planning/management officer

## 2024-03-03 NOTE — Telephone Encounter (Signed)
 Received referral notification from [facility] that this pt is eligible to participate in Cardiac rehab s/p 2/19 DES x2 LAD.  To complete cardiology follow up on 3/7. See that pt sees majority of providers in the [place] area.  Called and left message for pt preference of location for Cardiac rehab [facility] vs [facility]. Alanson Aly, BSN Cardiac and Emergency planning/management officer

## 2024-03-17 ENCOUNTER — Ambulatory Visit: Payer: Medicare HMO | Admitting: Dermatology

## 2024-03-31 ENCOUNTER — Ambulatory Visit

## 2024-03-31 ENCOUNTER — Other Ambulatory Visit: Payer: Self-pay

## 2024-04-23 NOTE — Progress Notes (Signed)
 The ASCVD Risk score (Arnett DK, et al., 2019) failed to calculate for the following reasons:   The valid total cholesterol range is 130 to 320 mg/dL  Arlon Bergamo, BSN, RN

## 2024-06-23 ENCOUNTER — Other Ambulatory Visit: Payer: Self-pay | Admitting: Podiatry

## 2024-06-26 ENCOUNTER — Other Ambulatory Visit: Payer: Self-pay | Admitting: Podiatry

## 2024-07-04 DIAGNOSIS — N184 Chronic kidney disease, stage 4 (severe): Secondary | ICD-10-CM | POA: Diagnosis not present

## 2024-07-04 DIAGNOSIS — Z1159 Encounter for screening for other viral diseases: Secondary | ICD-10-CM | POA: Diagnosis not present

## 2024-07-04 DIAGNOSIS — Z7289 Other problems related to lifestyle: Secondary | ICD-10-CM | POA: Diagnosis not present

## 2024-07-04 DIAGNOSIS — E876 Hypokalemia: Secondary | ICD-10-CM | POA: Diagnosis not present

## 2024-07-04 DIAGNOSIS — M109 Gout, unspecified: Secondary | ICD-10-CM | POA: Diagnosis not present

## 2024-07-07 DIAGNOSIS — E876 Hypokalemia: Secondary | ICD-10-CM | POA: Diagnosis not present

## 2024-07-07 DIAGNOSIS — D631 Anemia in chronic kidney disease: Secondary | ICD-10-CM | POA: Diagnosis not present

## 2024-07-07 DIAGNOSIS — Z794 Long term (current) use of insulin: Secondary | ICD-10-CM | POA: Diagnosis not present

## 2024-07-07 DIAGNOSIS — N184 Chronic kidney disease, stage 4 (severe): Secondary | ICD-10-CM | POA: Diagnosis not present

## 2024-07-07 DIAGNOSIS — I5030 Unspecified diastolic (congestive) heart failure: Secondary | ICD-10-CM | POA: Diagnosis not present

## 2024-07-07 DIAGNOSIS — I10 Essential (primary) hypertension: Secondary | ICD-10-CM | POA: Diagnosis not present

## 2024-07-07 DIAGNOSIS — E1165 Type 2 diabetes mellitus with hyperglycemia: Secondary | ICD-10-CM | POA: Diagnosis not present

## 2024-07-10 ENCOUNTER — Ambulatory Visit: Admitting: Internal Medicine

## 2024-07-14 DIAGNOSIS — H43813 Vitreous degeneration, bilateral: Secondary | ICD-10-CM | POA: Diagnosis not present

## 2024-07-14 DIAGNOSIS — E113293 Type 2 diabetes mellitus with mild nonproliferative diabetic retinopathy without macular edema, bilateral: Secondary | ICD-10-CM | POA: Diagnosis not present

## 2024-07-14 DIAGNOSIS — H25043 Posterior subcapsular polar age-related cataract, bilateral: Secondary | ICD-10-CM | POA: Diagnosis not present

## 2024-07-14 DIAGNOSIS — H2513 Age-related nuclear cataract, bilateral: Secondary | ICD-10-CM | POA: Diagnosis not present

## 2024-07-15 NOTE — Telephone Encounter (Signed)
 [facility] RHEUMATOLOGY CLINIC - PHARMACIST NOTES    Second attempt to reach patient re: new start anakinra  and indeterminate TB test. No answer on both numbers and unable to leave voicemail.     Latanya

## 2024-07-21 ENCOUNTER — Ambulatory Visit: Admitting: Infectious Diseases

## 2024-07-21 ENCOUNTER — Other Ambulatory Visit: Payer: Self-pay

## 2024-07-21 ENCOUNTER — Encounter: Payer: Self-pay | Admitting: Infectious Diseases

## 2024-07-21 VITALS — BP 138/67 | HR 94 | Temp 98.2°F | Wt 266.0 lb

## 2024-07-21 DIAGNOSIS — Z7185 Encounter for immunization safety counseling: Secondary | ICD-10-CM

## 2024-07-21 DIAGNOSIS — Z79899 Other long term (current) drug therapy: Secondary | ICD-10-CM | POA: Diagnosis not present

## 2024-07-21 DIAGNOSIS — Z0000 Encounter for general adult medical examination without abnormal findings: Secondary | ICD-10-CM | POA: Insufficient documentation

## 2024-07-21 DIAGNOSIS — F17200 Nicotine dependence, unspecified, uncomplicated: Secondary | ICD-10-CM

## 2024-07-21 DIAGNOSIS — B20 Human immunodeficiency virus [HIV] disease: Secondary | ICD-10-CM

## 2024-07-21 DIAGNOSIS — Z23 Encounter for immunization: Secondary | ICD-10-CM | POA: Diagnosis not present

## 2024-07-21 DIAGNOSIS — Z113 Encounter for screening for infections with a predominantly sexual mode of transmission: Secondary | ICD-10-CM | POA: Insufficient documentation

## 2024-07-21 MED ORDER — BICTEGRAVIR-EMTRICITAB-TENOFOV 50-200-25 MG PO TABS: 1.0000 | ORAL_TABLET | Freq: Every day | ORAL | 11 refills | Status: AC

## 2024-07-21 NOTE — Progress Notes (Incomplete)
 [address], [place], [place], [address]                                                                  Phn. [phone]; Fax: [phone]                                                                             Date: 07/21/24  Reason for Visit: HIV fu    HPI: Harold Crosby is a 53 y.o.old male with a history of HTN, HLD, DM2, Smoker and HIV who is here for fu. Last seen by Dr Fleeta Rothman in June 2024.   Interval hx/current visit: Reports compliance with Biktarvy, no missed doses or concerns. Had loose stool for 3-4 days and a lot of gas with whatever he eats which also bursted hemorrhoids. His PCP is following and reports loose stool is getting better. Follows dentist at  [facility] [place] [facility]. Willing to get PCV 20 but does not want COVID vaccine.  Follows with PCP and he is yet to submit stool sample for cologuard. Reports h/o chronic leg swelling. Lives with girlfriend. Smokes 1-2 cigarettes a a day and he enjoys it with morning coffee and does not intend to quit. No other complaints.   ROS: As stated in above HPI; all other systems were reviewed and are otherwise negative unless noted below  No reported fever / chills, night sweats, unintentional weight loss, acute visual change, odynophagia, chest pain/pressure, new or worsened SOB or WOB, nausea, vomiting, diarrhea, dysuria, GU discharge, syncope, seizures, red/hot swollen joints, hallucinations / delusions, rashes, new allergies, unusual / excessive bleeding, swollen lymph nodes, or new hospitalizations/ED visits/Urgent Care visits since the pt was last seen.  PMH/ PSH/ FamHx / Social Hx , medications and allergies reviewed and updated as appropriate; please see corresponding tab in EHR / prior notes                                        MEDS  Allergies   Allergen Reactions   Sulfonamide Derivatives Anaphylaxis    REACTION: skin welps, tongue swells, throat closes up   Past Medical History:  Diagnosis Date   History of thrush    HIV (human immunodeficiency virus infection) (HCC)    Followed by Dr. Eben in ID clinic.    Hyperlipidemia    Hypertension    Insomnia    Tobacco abuse    Type II diabetes mellitus (HCC)    Past Surgical History:  Procedure Laterality Date   INCISION AND DRAINAGE ABSCESS N/A 01/14/2023  Procedure: INCISION AND DRAINAGE PERIRECTAL ABSCESS;  Surgeon: Jordis Laneta FALCON, MD;  Location: [facility] ORS;  Service: General;  Laterality: N/A;   Social History   Socioeconomic History   Marital status: Single    Spouse name: Not on file   Number of children: Not on file   Years of education: Not on file   Highest education level: Not on file  Occupational History   Not on file  Tobacco Use   Smoking status: Every Day    Current packs/day: 0.10    Average packs/day: 0.1 packs/day for 20.0 years (2.0 ttl pk-yrs)    Types: Cigarettes    Passive exposure: Past   Smokeless tobacco: Never   Tobacco comments:    3-4 cigarettes a day   Substance and Sexual Activity   Alcohol use: No   Drug use: No   Sexual activity: Yes    Partners: Female    Comment: declined condoms  Other Topics Concern   Not on file  Social History Narrative   Same girlfriend x 7 years. No previous TFX. No previous male/male intercourse.  Un clear how many heterosexual partners he has had >10, <100.      [employer] approved till 03/25/11 [employer] benefits approved: patient eligible for 100% discount for outpatient lasbs and office visits. Patient available for no discount for other services. Heron Goodell 11/11.   Social Drivers of Health   Financial Resource Strain: Medium Risk (12/13/2023)   Received from [facility]   Overall Financial Resource Strain (CARDIA)    Difficulty of Paying Living Expenses: Somewhat hard  Food Insecurity:  Food Insecurity Present (12/13/2023)   Received from [facility]   Hunger Vital Sign    Within the past 12 months, you worried that your food would run out before you got the money to buy more.: Sometimes true    Within the past 12 months, the food you bought just didn't last and you didn't have money to get more.: Sometimes true  Transportation Needs: No Transportation Needs (12/13/2023)   Received from [facility] - Transportation    Lack of Transportation (Medical): No    Lack of Transportation (Non-Medical): No  Physical Activity: Not on file  Stress: Not on file  Social Connections: Not on file  Intimate Partner Violence: Not At Risk (07/04/2024)   Received from [facility]   Humiliation, Afraid, Rape, and Kick questionnaire    Within the last year, have you been afraid of your partner or ex-partner?: No    Within the last year, have you been humiliated or emotionally abused in other ways by your partner or ex-partner?: No    Within the last year, have you been kicked, hit, slapped, or otherwise physically hurt by your partner or ex-partner?: No    Within the last year, have you been raped or forced to have any kind of sexual activity by your partner or ex-partner?: No   Family History  Problem Relation Age of Onset   Coronary artery disease Other    Diabetes Other    Kidney failure Mother    Vitals  BP 138/67   Pulse 94   Temp 98.2 F (36.8 C) (Oral)   Wt 266 lb (120.7 kg)   SpO2 96%   BMI 35.09 kg/m   Examination  Gen: no acute distress HEENT: [place]/AT, no scleral icterus, no pale conjunctivae, hearing normal, oral mucosa moist Neck: Supple Cardio: Regular rate and rhythm, s1s2  Resp: Pulmonary effort normal in room air, normal breath sounds  GI: nondistended, soft GU: Musc: Extremities: No pedal edema Skin: No rashes Neuro: grossly non focal , awake, alert and oriented * 3  Psych: Calm, cooperative  Lab Results HIV 1 RNA Quant  Date Value   06/14/2023 98 Copies/mL (H)  05/22/2023 3,540 copies/mL (H)  05/14/2023 21,900 Copies/mL (H)   CD4 T Cell Abs (/uL)  Date Value  04/23/2023 408  05/12/2021 502  11/11/2020 531   No results found for: HIV1GENOSEQ Lab Results  Component Value Date   WBC 6.0 05/14/2023   HGB 14.3 05/14/2023   HCT 43.2 05/14/2023   MCV 96.9 05/14/2023   PLT 212 05/14/2023    Lab Results  Component Value Date   CREATININE 1.32 (H) 06/14/2023   BUN 12 06/14/2023   NA 143 06/14/2023   K 3.2 (L) 06/14/2023   CL 104 06/14/2023   CO2 34 (H) 06/14/2023   Lab Results  Component Value Date   ALT 37 06/14/2023   AST 49 (H) 06/14/2023   ALKPHOS 90 01/10/2023   BILITOT 0.2 06/14/2023    Lab Results  Component Value Date   CHOL 110 04/23/2023   TRIG 361 (H) 04/23/2023   HDL 32 (L) 04/23/2023   LDLCALC 40 04/23/2023   Lab Results  Component Value Date   HAV NEG 05/01/2007   Lab Results  Component Value Date   HEPBSAG NEG 05/01/2007   HEPBSAB NEG 09/10/2013   Lab Results  Component Value Date   HCVAB NEG 05/01/2007   Lab Results  Component Value Date   CHLAMYDIAWP Negative 05/14/2023   N Negative 05/14/2023   No results found for: GCPROBEAPT No results found for: QUANTGOLD  Health Maintenance: Immunization History  Administered Date(s) Administered   H1N1 12/02/2008   Hepatitis A 11/06/2007, 05/06/2008   Hepatitis B 05/31/2007, 11/06/2007, 05/06/2008   Hepatitis B, ADULT 11/05/2013   Influenza Whole 11/06/2007, 12/02/2008, 09/27/2009, 10/20/2010, 09/04/2011   Influenza, Seasonal, Injecte, Preservative Fre 08/23/2015   Influenza,inj,Quad PF,6+ Mos 11/05/2013, 08/12/2014, 01/24/2017, 08/28/2018, 11/11/2021   Influenza-Unspecified 11/05/2018, 11/10/2020   PNEUMOCOCCAL CONJUGATE-20 07/21/2024   Pneumococcal Conjugate-13 05/31/2007   Pneumococcal Polysaccharide-23 12/05/2010   Td 12/05/2010   Tdap 09/20/2019   Assessment/Plan: # HIV - continue PO biktarvy  - labs  today  - fu in 6 months pending labs  # DM2/HTN/HLD - fu with PCP, on statin   # STD Screening  - Urine GC and RPR  # Smoking - declined counseling  #Immunization  - PCV 20 today  # Health maintenance - follows dental clinic - discussed about colon ca screening and will fu with PCP  Patient's labs were reviewed as well as his previous records. Patients questions were addressed and answered. Safe sex counseling done.   I spent 38 minutes involved in face-to-face and non-face-to-face activities for this patient on the day of the visit. Professional time spent includes the following activities: Preparing to see the patient (review of tests), Obtaining and reviewing separately obtained history (prior ID notes), Performing a medically appropriate examination and evaluation , Ordering medications/labs and vaccine, Documenting clinical information in the EMR, Independently interpreting results (not separately reported), Communicating results to the patient, Counseling and educating the patient and Care coordination (not separately reported).   Of note, portions of this note may have been created with voice recognition software. While this note has been edited for accuracy, occasional wrong-word or 'sound-a-like' substitutions may have occurred due to the inherent  limitations of voice recognition software.   Electronically signed by:  Annalee Orem, MD Infectious Disease Physician [facility] [address] [place], [place] [address] Phone: [phone]  Fax: [phone]

## 2024-07-22 ENCOUNTER — Ambulatory Visit: Payer: Self-pay | Admitting: Infectious Diseases

## 2024-07-22 LAB — T-HELPER CELLS (CD4) COUNT (NOT AT ARMC)
CD4 % Helper T Cell: 50 % (ref 33–65)
CD4 T Cell Abs: 466 /uL (ref 400–1790)

## 2024-07-22 NOTE — Telephone Encounter (Signed)
 Called patient x2, no answer. Left HIPAA compliant voicemail requesting callback to discuss urgent lab results.   Alphus Zeck, BSN, RN

## 2024-07-22 NOTE — Telephone Encounter (Signed)
 Patient returned call, relayed BG elevated at over 500 and creatinine elevated. Discussed provider's recommendation that he go to the emergency department. He denies abnormal symptoms such as increased thirst/urination, nausea, vomiting, or confusion.   States he does have diarrhea and gas. He has a continuous blood glucose monitor and reports current reading is 101. States he ate right before his appointment yesterday.   Encouraged close follow up with PCP for diabetes management. Discussed that if he develops any of the above symptoms he should go to the ED. Patient verbalized understanding and has no further questions.   Maresa Morash, BSN, RN

## 2024-07-22 NOTE — Telephone Encounter (Signed)
 Called Sharon Allred (significant other), no answer. Left HIPAA compliant voicemail requesting she have [facility] give our office a call.   Called Reena Pander (sister), let her know that our office is trying to get in touch with Ples. She will give him a call and see if she can reach him and ask him to call our office back.   Rocklin Soderquist, BSN, RN

## 2024-07-23 LAB — CBC
HCT: 34.4 % — ABNORMAL LOW (ref 38.5–50.0)
Hemoglobin: 10.5 g/dL — ABNORMAL LOW (ref 13.2–17.1)
MCH: 31.8 pg (ref 27.0–33.0)
MCHC: 30.5 g/dL — ABNORMAL LOW (ref 32.0–36.0)
MCV: 104.2 fL — ABNORMAL HIGH (ref 80.0–100.0)
MPV: 10.5 fL (ref 7.5–12.5)
Platelets: 257 Thousand/uL (ref 140–400)
RBC: 3.3 Million/uL — ABNORMAL LOW (ref 4.20–5.80)
RDW: 14.3 % (ref 11.0–15.0)
WBC: 7.4 Thousand/uL (ref 3.8–10.8)

## 2024-07-23 LAB — LIPID PANEL
Cholesterol: 119 mg/dL (ref <200)
HDL: 34 mg/dL — ABNORMAL LOW (ref >=40)
LDL Cholesterol (Calc): 50 mg/dL
Non-HDL Cholesterol (Calc): 85 mg/dL (ref <130)
Total CHOL/HDL Ratio: 3.5 (calc) (ref <5.0)
Triglycerides: 336 mg/dL — ABNORMAL HIGH (ref <150)

## 2024-07-23 LAB — COMPREHENSIVE METABOLIC PANEL WITH GFR
AG Ratio: 1.1 (calc) (ref 1.0–2.5)
ALT: 24 U/L (ref 9–46)
AST: 18 U/L (ref 10–35)
Albumin: 3.4 g/dL — ABNORMAL LOW (ref 3.6–5.1)
Alkaline phosphatase (APISO): 111 U/L (ref 35–144)
BUN/Creatinine Ratio: 28 (calc) — ABNORMAL HIGH (ref 6–22)
BUN: 64 mg/dL — ABNORMAL HIGH (ref 7–25)
CO2: 29 mmol/L (ref 20–32)
Calcium: 9.4 mg/dL (ref 8.6–10.3)
Chloride: 85 mmol/L — ABNORMAL LOW (ref 98–110)
Creat: 2.27 mg/dL — ABNORMAL HIGH (ref 0.70–1.30)
Globulin: 3.1 g/dL (ref 1.9–3.7)
Glucose, Bld: 557 mg/dL (ref 65–99)
Potassium: 3.5 mmol/L (ref 3.5–5.3)
Sodium: 130 mmol/L — ABNORMAL LOW (ref 135–146)
Total Bilirubin: 0.3 mg/dL (ref 0.2–1.2)
Total Protein: 6.5 g/dL (ref 6.1–8.1)
eGFR: 33 mL/min/1.73m2 — ABNORMAL LOW (ref >=60)

## 2024-07-23 LAB — HIV RNA, RTPCR W/R GT (RTI, PI,INT)
HIV 1 RNA Quant: NOT DETECTED {copies}/mL
HIV-1 RNA Quant, Log: NOT DETECTED {Log_copies}/mL

## 2024-07-23 LAB — RPR: RPR Ser Ql: NONREACTIVE

## 2024-07-24 DIAGNOSIS — I2510 Atherosclerotic heart disease of native coronary artery without angina pectoris: Secondary | ICD-10-CM | POA: Diagnosis not present

## 2024-07-24 DIAGNOSIS — Z794 Long term (current) use of insulin: Secondary | ICD-10-CM | POA: Diagnosis not present

## 2024-07-24 DIAGNOSIS — Z006 Encounter for examination for normal comparison and control in clinical research program: Secondary | ICD-10-CM | POA: Diagnosis not present

## 2024-07-24 DIAGNOSIS — I5032 Chronic diastolic (congestive) heart failure: Secondary | ICD-10-CM | POA: Diagnosis not present

## 2024-07-24 DIAGNOSIS — I130 Hypertensive heart and chronic kidney disease with heart failure and stage 1 through stage 4 chronic kidney disease, or unspecified chronic kidney disease: Secondary | ICD-10-CM | POA: Diagnosis not present

## 2024-07-24 DIAGNOSIS — E1122 Type 2 diabetes mellitus with diabetic chronic kidney disease: Secondary | ICD-10-CM | POA: Diagnosis not present

## 2024-07-24 DIAGNOSIS — E1121 Type 2 diabetes mellitus with diabetic nephropathy: Secondary | ICD-10-CM | POA: Diagnosis not present

## 2024-07-24 DIAGNOSIS — E1165 Type 2 diabetes mellitus with hyperglycemia: Secondary | ICD-10-CM | POA: Diagnosis not present

## 2024-07-24 DIAGNOSIS — I509 Heart failure, unspecified: Secondary | ICD-10-CM | POA: Diagnosis not present

## 2024-07-24 DIAGNOSIS — N184 Chronic kidney disease, stage 4 (severe): Secondary | ICD-10-CM | POA: Diagnosis not present

## 2024-07-28 DIAGNOSIS — I509 Heart failure, unspecified: Secondary | ICD-10-CM | POA: Diagnosis not present

## 2024-07-29 DIAGNOSIS — H2511 Age-related nuclear cataract, right eye: Secondary | ICD-10-CM | POA: Diagnosis not present

## 2024-08-05 DIAGNOSIS — R194 Change in bowel habit: Secondary | ICD-10-CM | POA: Diagnosis not present

## 2024-08-05 DIAGNOSIS — R141 Gas pain: Secondary | ICD-10-CM | POA: Diagnosis not present

## 2024-08-05 DIAGNOSIS — R1084 Generalized abdominal pain: Secondary | ICD-10-CM | POA: Diagnosis not present

## 2024-08-11 ENCOUNTER — Encounter: Payer: Self-pay | Admitting: Ophthalmology

## 2024-08-18 NOTE — Discharge Instructions (Signed)

## 2024-08-20 ENCOUNTER — Encounter: Payer: Self-pay | Admitting: Ophthalmology

## 2024-08-20 ENCOUNTER — Ambulatory Visit: Payer: Self-pay | Admitting: Anesthesiology

## 2024-08-20 ENCOUNTER — Ambulatory Visit
Admission: RE | Admit: 2024-08-20 | Discharge: 2024-08-20 | Disposition: A | Discharge status: Home / Self Care | Attending: Ophthalmology | Admitting: Ophthalmology

## 2024-08-20 ENCOUNTER — Other Ambulatory Visit: Payer: Self-pay

## 2024-08-20 ENCOUNTER — Encounter
Admission: RE | Disposition: A | Discharge status: Home / Self Care | Payer: Self-pay | Source: Home / Self Care | Attending: Ophthalmology

## 2024-08-20 DIAGNOSIS — F17210 Nicotine dependence, cigarettes, uncomplicated: Secondary | ICD-10-CM | POA: Diagnosis not present

## 2024-08-20 DIAGNOSIS — N184 Chronic kidney disease, stage 4 (severe): Secondary | ICD-10-CM | POA: Insufficient documentation

## 2024-08-20 DIAGNOSIS — Z7985 Long-term (current) use of injectable non-insulin antidiabetic drugs: Secondary | ICD-10-CM | POA: Insufficient documentation

## 2024-08-20 DIAGNOSIS — Z5986 Financial insecurity: Secondary | ICD-10-CM | POA: Insufficient documentation

## 2024-08-20 DIAGNOSIS — I5030 Unspecified diastolic (congestive) heart failure: Secondary | ICD-10-CM | POA: Diagnosis not present

## 2024-08-20 DIAGNOSIS — Z5941 Food insecurity: Secondary | ICD-10-CM | POA: Insufficient documentation

## 2024-08-20 DIAGNOSIS — Z794 Long term (current) use of insulin: Secondary | ICD-10-CM | POA: Diagnosis not present

## 2024-08-20 DIAGNOSIS — Z7984 Long term (current) use of oral hypoglycemic drugs: Secondary | ICD-10-CM | POA: Insufficient documentation

## 2024-08-20 DIAGNOSIS — E1136 Type 2 diabetes mellitus with diabetic cataract: Secondary | ICD-10-CM | POA: Diagnosis not present

## 2024-08-20 DIAGNOSIS — H2511 Age-related nuclear cataract, right eye: Secondary | ICD-10-CM | POA: Insufficient documentation

## 2024-08-20 DIAGNOSIS — E1122 Type 2 diabetes mellitus with diabetic chronic kidney disease: Secondary | ICD-10-CM | POA: Diagnosis not present

## 2024-08-20 DIAGNOSIS — I129 Hypertensive chronic kidney disease with stage 1 through stage 4 chronic kidney disease, or unspecified chronic kidney disease: Secondary | ICD-10-CM | POA: Diagnosis not present

## 2024-08-20 DIAGNOSIS — I130 Hypertensive heart and chronic kidney disease with heart failure and stage 1 through stage 4 chronic kidney disease, or unspecified chronic kidney disease: Secondary | ICD-10-CM | POA: Diagnosis not present

## 2024-08-20 HISTORY — DX: Chronic kidney disease, unspecified: N18.9

## 2024-08-20 HISTORY — DX: Unspecified osteoarthritis, unspecified site: M19.90

## 2024-08-20 HISTORY — DX: Chronic kidney disease, stage 4 (severe): N18.4

## 2024-08-20 HISTORY — DX: Chronic obstructive pulmonary disease, unspecified: J44.9

## 2024-08-20 HISTORY — PX: CATARACT EXTRACTION W/PHACO: SHX586

## 2024-08-20 HISTORY — DX: Anemia, unspecified: D64.9

## 2024-08-20 HISTORY — DX: Gout, unspecified: M10.9

## 2024-08-20 LAB — GLUCOSE, CAPILLARY: Glucose-Capillary: 207 mg/dL — ABNORMAL HIGH (ref 70–99)

## 2024-08-20 SURGERY — PHACOEMULSIFICATION, CATARACT, WITH IOL INSERTION
Anesthesia: Monitor Anesthesia Care | Site: Eye | Laterality: Right

## 2024-08-20 MED ORDER — MIDAZOLAM HCL 2 MG/2ML IJ SOLN
INTRAMUSCULAR | Status: DC | PRN
  Administered 2024-08-20: 2 mg via INTRAVENOUS

## 2024-08-20 MED ORDER — BRIMONIDINE TARTRATE-TIMOLOL 0.2-0.5 % OP SOLN
OPHTHALMIC | Status: DC | PRN
  Administered 2024-08-20: 1 [drp] via OPHTHALMIC

## 2024-08-20 MED ORDER — SIGHTPATH DOSE#1 NA HYALUR & NA CHOND-NA HYALUR IO KIT
PACK | INTRAOCULAR | Status: DC | PRN
  Administered 2024-08-20: 1 via OPHTHALMIC

## 2024-08-20 MED ORDER — CEFUROXIME OPHTHALMIC INJECTION 1 MG/0.1 ML
INJECTION | OPHTHALMIC | Status: DC | PRN
Start: 2024-08-20 — End: 2024-08-20
  Administered 2024-08-20: .1 mL via INTRACAMERAL

## 2024-08-20 MED ORDER — ARMC OPHTHALMIC DILATING DROPS
1.0000 | OPHTHALMIC | Status: DC | PRN
  Administered 2024-08-20 (×3): 1 via OPHTHALMIC

## 2024-08-20 MED ORDER — FENTANYL CITRATE (PF) 100 MCG/2ML IJ SOLN
INTRAMUSCULAR | Status: DC | PRN
  Administered 2024-08-20 (×2): 25 ug via INTRAVENOUS
  Administered 2024-08-20: 50 ug via INTRAVENOUS

## 2024-08-20 MED ORDER — FENTANYL CITRATE (PF) 100 MCG/2ML IJ SOLN
INTRAMUSCULAR | Status: AC
  Filled 2024-08-20: qty 2

## 2024-08-20 MED ORDER — GLYCOPYRROLATE 0.2 MG/ML IJ SOLN
INTRAMUSCULAR | Status: AC
  Filled 2024-08-20: qty 1

## 2024-08-20 MED ORDER — EPINEPHRINE PF 1 MG/ML IJ SOLN
INTRAMUSCULAR | Status: DC | PRN
  Administered 2024-08-20: 54 mL via OPHTHALMIC

## 2024-08-20 MED ORDER — ARMC OPHTHALMIC DILATING DROPS
OPHTHALMIC | Status: AC
  Filled 2024-08-20: qty 0.5

## 2024-08-20 MED ORDER — SIGHTPATH DOSE#1 BSS IO SOLN
INTRAOCULAR | Status: DC | PRN
Start: 2024-08-20 — End: 2024-08-20
  Administered 2024-08-20: 15 mL via INTRAOCULAR

## 2024-08-20 MED ORDER — LIDOCAINE HCL (PF) 2 % IJ SOLN
INTRAMUSCULAR | Status: DC | PRN
  Administered 2024-08-20: 2 mL

## 2024-08-20 MED ORDER — MIDAZOLAM HCL 2 MG/2ML IJ SOLN
INTRAMUSCULAR | Status: AC
  Filled 2024-08-20: qty 2

## 2024-08-20 MED ORDER — GLYCOPYRROLATE 0.2 MG/ML IJ SOLN
INTRAMUSCULAR | Status: DC | PRN
  Administered 2024-08-20: .2 mg via INTRAVENOUS

## 2024-08-20 MED ORDER — TETRACAINE HCL 0.5 % OP SOLN
OPHTHALMIC | Status: AC
  Filled 2024-08-20: qty 4

## 2024-08-20 MED ORDER — TETRACAINE HCL 0.5 % OP SOLN
1.0000 [drp] | OPHTHALMIC | Status: DC | PRN
  Administered 2024-08-20 (×3): 1 [drp] via OPHTHALMIC

## 2024-08-20 SURGICAL SUPPLY — 8 items
FEE CATARACT SUITE SIGHTPATH (MISCELLANEOUS) ×1 IMPLANT
GLOVE BIOGEL PI IND STRL 8 (GLOVE) ×1 IMPLANT
GLOVE SURG LX STRL 7.5 STRW (GLOVE) ×1 IMPLANT
GLOVE SURG SYN 6.5 PF PI BL (GLOVE) ×1 IMPLANT
LENS IOL TECNIS EYHANCE 16.5 (Intraocular Lens) IMPLANT
NDL FILTER BLUNT 18X1 1/2 (NEEDLE) ×1 IMPLANT
NEEDLE FILTER BLUNT 18X1 1/2 (NEEDLE) ×1 IMPLANT
SYR 3ML LL SCALE MARK (SYRINGE) ×1 IMPLANT

## 2024-08-20 NOTE — H&P (Signed)
 [facility]   Primary Care Physician:  Fernande Ophelia JINNY DOUGLAS, MD Ophthalmologist: Dr. Dene Etienne  Pre-Procedure History & Physical: HPI:  Harold Crosby is a 53 y.o. male here for ophthalmic surgery.   Past Medical History:  Diagnosis Date   Anemia    Arthritis    Chronic kidney disease    Chronic renal disease, stage IV (HCC)    COPD (chronic obstructive pulmonary disease) (HCC)    Gout    History of thrush    HIV (human immunodeficiency virus infection) (HCC)    Followed by Dr. Eben in ID clinic.    Hyperlipidemia    Hypertension    Insomnia    Tobacco abuse    Type II diabetes mellitus (HCC)     Past Surgical History:  Procedure Laterality Date   INCISION AND DRAINAGE ABSCESS N/A 01/14/2023   Procedure: INCISION AND DRAINAGE PERIRECTAL ABSCESS;  Surgeon: Jordis Laneta FALCON, MD;  Location: [facility] ORS;  Service: General;  Laterality: N/A;   STENT PLACEMENT VASCULAR ([facility] HX)      Prior to Admission medications   Medication Sig Start Date End Date Taking? Authorizing Provider  allopurinol  (ZYLOPRIM ) 100 MG tablet Take 100 mg by mouth daily.   Yes [provider]  bictegravir-emtricitabine -tenofovir  AF (BIKTARVY) 50-200-25 MG TABS tablet Take 1 tablet by mouth daily. 07/21/24  Yes Manandhar, Sabina, MD  empagliflozin (JARDIANCE) 25 MG TABS tablet  01/09/23  Yes [provider]  finasteride  (PROSCAR ) 5 MG tablet Take 5 mg by mouth daily.   Yes [provider]  gabapentin  (NEURONTIN ) 600 MG tablet Take 1,200 mg by mouth 3 (three) times daily. 12/14/22  Yes [provider]  insulin  glargine (LANTUS ) 100 UNIT/ML injection Inject 0.45 mLs (45 Units total) into the skin 2 (two) times daily. 11/24/15  Yes Burns, Johana SAUNDERS, MD  metFORMIN  (GLUCOPHAGE ) 1000 MG tablet Take 1 tablet (1,000 mg total) by mouth 2 (two) times daily with a meal. 07/19/12  Yes Lanis Harder, MD  rosuvastatin  (CRESTOR ) 20 MG tablet Take 1 tablet by mouth daily. 01/05/21  Yes  [provider]  tiZANidine  (ZANAFLEX ) 4 MG tablet Take 4 mg by mouth 3 (three) times daily. 12/11/22  Yes [provider]  torsemide (DEMADEX) 10 MG tablet Take 10 mg by mouth daily.   Yes [provider]  celecoxib (CELEBREX) 100 MG capsule Take 100 mg by mouth 2 (two) times daily as needed. Patient not taking: Reported on 10/03/2023 02/22/21   [provider]  colchicine 0.6 MG tablet Take 0.6 mg by mouth as needed. Patient not taking: Reported on 08/11/2024    [provider]  Continuous Glucose Receiver (FREESTYLE LIBRE 3 READER) DEVI  03/20/23   [provider]  doxycycline  (VIBRA -TABS) 100 MG tablet Take 1 tablet (100 mg total) by mouth 2 (two) times daily. Patient not taking: Reported on 08/11/2024 09/05/23   Tobie Franky SQUIBB, DPM  furosemide  (LASIX ) 40 MG tablet Take 80 mg by mouth 3 (three) times daily. Patient not taking: Reported on 08/11/2024    [provider]  HUMULIN R  U-500 KWIKPEN 500 UNIT/ML KwikPen Inject 100-200 Units into the skin 3 (three) times daily with meals. 12/05/22   [provider]  liraglutide (VICTOZA) 18 MG/3ML SOPN Inject 1.8 mg into the skin daily. Patient not taking: Reported on 08/11/2024    [provider]  oxyCODONE  (OXY IR/ROXICODONE ) 5 MG immediate release tablet Take 1 tablet (5 mg total) by mouth every 6 (  six) hours as needed for severe pain or breakthrough pain. Patient not taking: Reported on 08/20/2024 01/23/23   Schulz, Zachary R, PA-C    Allergies as of 07/22/2024 - Review Complete 07/21/2024  Allergen Reaction Noted   Sulfonamide derivatives Anaphylaxis     Family History  Problem Relation Age of Onset   Coronary artery disease Other    Diabetes Other    Kidney failure Mother     Social History   Socioeconomic History   Marital status: Single    Spouse name: Not on file   Number of children: Not on file   Years of education: Not on file   Highest education level:  Not on file  Occupational History   Not on file  Tobacco Use   Smoking status: Every Day    Current packs/day: 0.10    Average packs/day: 0.1 packs/day for 20.0 years (2.0 ttl pk-yrs)    Types: Cigarettes    Passive exposure: Past   Smokeless tobacco: Never   Tobacco comments:    3-4 cigarettes a day   Substance and Sexual Activity   Alcohol use: No   Drug use: No   Sexual activity: Yes    Partners: Female    Comment: declined condoms  Other Topics Concern   Not on file  Social History Narrative   Same girlfriend x 7 years. No previous TFX. No previous male/male intercourse.  Un clear how many heterosexual partners he has had >10, <100.      [place] approved till 03/25/11 [employer] benefits approved: patient eligible for 100% discount for outpatient lasbs and office visits. Patient available for no discount for other services. Heron Goodell 11/11.   Social Drivers of Health   Financial Resource Strain: Medium Risk (12/13/2023)   Received from [facility]   Overall Financial Resource Strain (CARDIA)    Difficulty of Paying Living Expenses: Somewhat hard  Food Insecurity: Food Insecurity Present (12/13/2023)   Received from [facility]   Hunger Vital Sign    Within the past 12 months, you worried that your food would run out before you got the money to buy more.: Sometimes true    Within the past 12 months, the food you bought just didn't last and you didn't have money to get more.: Sometimes true  Transportation Needs: No Transportation Needs (12/13/2023)   Received from [facility] - Transportation    Lack of Transportation (Medical): No    Lack of Transportation (Non-Medical): No  Physical Activity: Not on file  Stress: Not on file  Social Connections: Not on file  Intimate Partner Violence: Not At Risk (07/04/2024)   Received from [facility]   Humiliation, Afraid, Rape, and Kick questionnaire    Within the last year, have you been afraid of  your partner or ex-partner?: No    Within the last year, have you been humiliated or emotionally abused in other ways by your partner or ex-partner?: No    Within the last year, have you been kicked, hit, slapped, or otherwise physically hurt by your partner or ex-partner?: No    Within the last year, have you been raped or forced to have any kind of sexual activity by your partner or ex-partner?: No    Review of Systems: See HPI, otherwise negative ROS  Physical Exam: BP 139/75   Temp 98.3 F (36.8 C) (Temporal)   Resp 19   Ht 6' 0.99 (1.854 m)   Wt  114.8 kg   SpO2 98%   BMI 33.39 kg/m  General:   Alert,  pleasant and cooperative in NAD Head:  Normocephalic and atraumatic. Lungs:  Clear to auscultation.    Heart:  Regular rate and rhythm.   Impression/Plan: Harold Crosby is here for ophthalmic surgery.  Risks, benefits, limitations, and alternatives regarding ophthalmic surgery have been reviewed with the patient.  Questions have been answered.  All parties agreeable.   MITTIE GASKIN, MD  08/20/2024, 9:42 AM

## 2024-08-20 NOTE — Anesthesia Preprocedure Evaluation (Addendum)
 Anesthesia Evaluation  Patient identified by MRN, date of birth, ID band Patient awake    Reviewed: Allergy & Precautions, NPO status , Patient's Chart, lab work & pertinent test results  History of Anesthesia Complications (+) DIFFICULT AIRWAY and history of anesthetic complications  Airway Mallampati: IV  TM Distance: >3 FB Neck ROM: full    Dental no notable dental hx.    Pulmonary sleep apnea and Continuous Positive Airway Pressure Ventilation , COPD,  COPD inhaler, Current Smoker   Pulmonary exam normal        Cardiovascular hypertension, On Medications + CAD, + Cardiac Stents and +CHF  Normal cardiovascular exam     Neuro/Psych negative neurological ROS  negative psych ROS   GI/Hepatic negative GI ROS, Neg liver ROS,,,  Endo/Other  diabetes, Poorly Controlled, Type 2, Oral Hypoglycemic Agents, Insulin  Dependent    Renal/GU CRFRenal disease     Musculoskeletal  (+) Arthritis ,    Abdominal   Peds  Hematology  (+) Blood dyscrasia, anemia , HIV  Anesthesia Other Findings Past Medical History: No date: Anemia No date: Arthritis No date: Chronic kidney disease No date: Chronic renal disease, stage IV (HCC) No date: COPD (chronic obstructive pulmonary disease) (HCC) No date: Gout No date: History of thrush No date: HIV (human immunodeficiency virus infection) (HCC)     Comment:  Followed by Dr. Eben in ID clinic.  No date: Hyperlipidemia No date: Hypertension No date: Insomnia No date: Tobacco abuse No date: Type II diabetes mellitus (HCC)  Past Surgical History: 01/14/2023: INCISION AND DRAINAGE ABSCESS; N/A     Comment:  Procedure: INCISION AND DRAINAGE PERIRECTAL ABSCESS;                Surgeon: Jordis Laneta FALCON, MD;  Location: [facility] ORS;                Service: General;  Laterality: N/A; No date: STENT PLACEMENT VASCULAR ([facility] HX)  BMI    Body Mass Index: 32.98 kg/m       Reproductive/Obstetrics negative OB ROS                              Anesthesia Physical Anesthesia Plan  ASA: 4  Anesthesia Plan: MAC   Post-op Pain Management: Minimal or no pain anticipated   Induction: Intravenous  PONV Risk Score and Plan:   Airway Management Planned: Natural Airway and Nasal Cannula  Additional Equipment:   Intra-op Plan:   Post-operative Plan:   Informed Consent: I have reviewed the patients History and Physical, chart, labs and discussed the procedure including the risks, benefits and alternatives for the proposed anesthesia with the patient or authorized representative who has indicated his/her understanding and acceptance.     Dental Advisory Given  Plan Discussed with: Anesthesiologist, CRNA and Surgeon  Anesthesia Plan Comments: (Patient consented for risks of anesthesia including but not limited to:  - adverse reactions to medications - damage to eyes, teeth, lips or other oral mucosa - nerve damage due to positioning  - sore throat or hoarseness - Damage to heart, brain, nerves, lungs, other parts of body or loss of life  Patient voiced understanding and assent.)         Anesthesia Quick Evaluation

## 2024-08-20 NOTE — Anesthesia Postprocedure Evaluation (Signed)
 Anesthesia Post Note  Patient: Harold Crosby  Procedure(s) Performed: PHACOEMULSIFICATION, CATARACT, WITH IOL INSERTION 3.62 00:35.3 (Right: Eye)  Patient location during evaluation: PACU Anesthesia Type: MAC Level of consciousness: awake and alert Pain management: pain level controlled Vital Signs Assessment: post-procedure vital signs reviewed and stable Respiratory status: spontaneous breathing, nonlabored ventilation, respiratory function stable and patient connected to nasal cannula oxygen Cardiovascular status: stable and blood pressure returned to baseline Postop Assessment: no apparent nausea or vomiting Anesthetic complications: no   No notable events documented.   Last Vitals:  Vitals:   08/20/24 1045 08/20/24 1048  BP: 139/78 133/76  Pulse: 88 88  Resp: 16 14  Temp:  36.5 C  SpO2: 96% 97%    Last Pain:  Vitals:   08/20/24 1048  TempSrc:   PainSc: 0-No pain                 Lendia LITTIE Mae

## 2024-08-20 NOTE — Transfer of Care (Signed)
 Immediate Anesthesia Transfer of Care Note  Patient: Harold Crosby  Procedure(s) Performed: PHACOEMULSIFICATION, CATARACT, WITH IOL INSERTION 3.62 00:35.3 (Right: Eye)  Patient Location: PACU  Anesthesia Type: MAC  Level of Consciousness: awake, alert  and patient cooperative  Airway and Oxygen Therapy: Patient Spontanous Breathing and Patient connected to supplemental oxygen  Post-op Assessment: Post-op Vital signs reviewed, Patient's Cardiovascular Status Stable, Respiratory Function Stable, Patent Airway and No signs of Nausea or vomiting  Post-op Vital Signs: Reviewed and stable  Complications: No notable events documented.

## 2024-08-20 NOTE — Op Note (Signed)
 LOCATION:  [facility]   PREOPERATIVE DIAGNOSIS:    Nuclear sclerotic cataract right eye. H25.11   POSTOPERATIVE DIAGNOSIS:  Nuclear sclerotic cataract right eye.     PROCEDURE:  Phacoemusification with posterior chamber intraocular lens placement of the right eye   ULTRASOUND TIME: Procedure(s): PHACOEMULSIFICATION, CATARACT, WITH IOL INSERTION 3.62 00:35.3 (Right)  LENS:   Implant Name Type Inv. Item Serial No. Manufacturer Lot No. LRB No. Used Action  LENS IOL TECNIS EYHANCE 16.5 - [id number] Intraocular Lens LENS IOL TECNIS EYHANCE 16.5 [id number] [employer]  Right 1 Implanted         SURGEON:  Dene FABIENE Etienne, MD   ANESTHESIA:  Topical with tetracaine  drops and 2% Xylocaine  jelly, augmented with 1% preservative-free intracameral lidocaine .    COMPLICATIONS:  None.   DESCRIPTION OF PROCEDURE:  The patient was identified in the holding room and transported to the operating room and placed in the supine position under the operating microscope.  The right eye was identified as the operative eye and it was prepped and draped in the usual sterile ophthalmic fashion.   A 1 millimeter clear-corneal paracentesis was made at the 12:00 position.  0.5 ml of preservative-free 1% lidocaine  was injected into the anterior chamber. The anterior chamber was filled with Viscoat viscoelastic.  A 2.4 millimeter keratome was used to make a near-clear corneal incision at the 9:00 position.  A curvilinear capsulorrhexis was made with a cystotome and capsulorrhexis forceps.  Balanced salt solution was used to hydrodissect and hydrodelineate the nucleus.   Phacoemulsification was then used in stop and chop fashion to remove the lens nucleus and epinucleus.  The remaining cortex was then removed using the irrigation and aspiration handpiece. Provisc was then placed into the capsular bag to distend it for lens placement.  A lens was then injected into the capsular bag.  The remaining  viscoelastic was aspirated.   Wounds were hydrated with balanced salt solution.  The anterior chamber was inflated to a physiologic pressure with balanced salt solution.  No wound leaks were noted. Cefuroxime  0.1 ml of a 10mg /ml solution was injected into the anterior chamber for a dose of 1 mg of intracameral antibiotic at the completion of the case.   Timolol  and Brimonidine  drops were applied to the eye.  The patient was taken to the recovery room in stable condition without complications of anesthesia or surgery.   Loukas Antonson 08/20/2024, 10:42 AM

## 2024-08-21 ENCOUNTER — Encounter: Payer: Self-pay | Admitting: Ophthalmology

## 2024-08-21 DIAGNOSIS — D631 Anemia in chronic kidney disease: Secondary | ICD-10-CM | POA: Diagnosis not present

## 2024-08-21 DIAGNOSIS — H2512 Age-related nuclear cataract, left eye: Secondary | ICD-10-CM | POA: Diagnosis not present

## 2024-08-21 DIAGNOSIS — I5032 Chronic diastolic (congestive) heart failure: Secondary | ICD-10-CM | POA: Diagnosis not present

## 2024-08-21 DIAGNOSIS — M109 Gout, unspecified: Secondary | ICD-10-CM | POA: Diagnosis not present

## 2024-08-21 DIAGNOSIS — N184 Chronic kidney disease, stage 4 (severe): Secondary | ICD-10-CM | POA: Diagnosis not present

## 2024-08-27 ENCOUNTER — Encounter: Payer: Self-pay | Admitting: Ophthalmology

## 2024-08-27 NOTE — Anesthesia Preprocedure Evaluation (Addendum)
 Anesthesia Evaluation  Patient identified by MRN, date of birth, ID bandGeneral Assessment Comment:Patient awake, and answers questions, but as time passed, he seemed to become more drowsy. Says he didn't sleep well last night.    Reviewed: Allergy & Precautions, H&P , NPO status , Patient's Chart, lab work & pertinent test results  Airway Mallampati: IV  TM Distance: >3 FB Neck ROM: Full    Dental no notable dental hx.    Pulmonary sleep apnea , COPD, Current SmokerPatient did not abstain from smoking.   Pulmonary exam normal breath sounds clear to auscultation       Cardiovascular hypertension, + angina  + CAD and +CHF  Normal cardiovascular exam Rhythm:Regular Rate:Normal  Office note 08-21-24 CAD with stable angina.  Nuclear stress test abnormal with reversible defect in inferior and inferolateral segments. Cath showed 3v CAD, poor surgical candidate. PCI to LAD with 2 overlapping stents performed 02/13/24. He continues to have intermittent chest pain but no exertional component. I have discussed further revascularization of RCA with Dr. Eloy who feels that it would require less contrast then recent PCI but would still need to be hospitalized at least 24 hours postop for monitoring of renal function. Given risk of worsening CKD, patient wants to medically manage for the time being. Has declined cardiac rehab     Neuro/Psych  Neuromuscular disease  negative psych ROS   GI/Hepatic negative GI ROS, Neg liver ROS,,,  Endo/Other  diabetes    Renal/GU Renal disease  negative genitourinary   Musculoskeletal  (+) Arthritis ,    Abdominal   Peds negative pediatric ROS (+)  Hematology  (+) Blood dyscrasia, anemia   Anesthesia Other Findings Previous cataract 08-20-24 Dr. Chesley Has constant blood sugar monitor right upper arm, blood sugar 319 diabetes, Poorly Controlled, Type 2, Oral Hypoglycemic Agents, Insulin  Dependent     Smoked one cigarette today BMP shows K 3.0. Have administered insulin , but sugars remain high. Discussed w/patient risks untreated blood sugars, and patient replied, Yes, I know.  Discussed w/Dr. Mittie the labs results, including glucose, and since patient seems to live at high glucose levels, Dr. Mittie wishes to proceed. Agree that this is patient's baseline, so will proceed with cataract surgery.  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) Tobacco use disorder 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) Diabetic foot ulcer (HCC) Hidradenitis suppurativa Polyneuropathy OSA on CPAP        Reproductive/Obstetrics negative OB ROS                              Anesthesia Physical Anesthesia Plan  ASA: 4  Anesthesia Plan: MAC   Post-op Pain Management:    Induction: Intravenous  PONV Risk Score and Plan:   Airway Management Planned: Natural Airway and Nasal Cannula  Additional Equipment:   Intra-op Plan:   Post-operative Plan:   Informed Consent: I have reviewed the patients History and Physical, chart, labs and discussed the procedure including the risks, benefits and alternatives  for the proposed anesthesia with the patient or authorized representative who has indicated his/her understanding and acceptance.     Dental Advisory Given  Plan Discussed  with: Anesthesiologist, CRNA and Surgeon  Anesthesia Plan Comments: (Patient consented for risks of anesthesia including but not limited to:  - adverse reactions to medications - damage to eyes, teeth, lips or other oral mucosa - nerve damage due to positioning  - sore throat or hoarseness - Damage to heart, brain, nerves, lungs, other parts of body or loss of life  Patient voiced understanding and assent.)         Anesthesia Quick Evaluation

## 2024-09-01 NOTE — Discharge Instructions (Signed)

## 2024-09-03 ENCOUNTER — Encounter: Payer: Self-pay | Admitting: Ophthalmology

## 2024-09-03 ENCOUNTER — Ambulatory Visit: Payer: Self-pay | Admitting: Anesthesiology

## 2024-09-03 ENCOUNTER — Other Ambulatory Visit
Admission: RE | Admit: 2024-09-03 | Discharge: 2024-09-03 | Disposition: A | Discharge status: Home / Self Care | Attending: Anesthesiology | Admitting: Anesthesiology

## 2024-09-03 ENCOUNTER — Encounter
Admission: RE | Disposition: A | Discharge status: Home / Self Care | Payer: Self-pay | Source: Home / Self Care | Attending: Ophthalmology

## 2024-09-03 ENCOUNTER — Ambulatory Visit
Admission: RE | Admit: 2024-09-03 | Discharge: 2024-09-03 | Disposition: A | Discharge status: Home / Self Care | Attending: Ophthalmology | Admitting: Ophthalmology

## 2024-09-03 ENCOUNTER — Other Ambulatory Visit: Payer: Self-pay

## 2024-09-03 DIAGNOSIS — H2512 Age-related nuclear cataract, left eye: Secondary | ICD-10-CM | POA: Diagnosis not present

## 2024-09-03 DIAGNOSIS — I130 Hypertensive heart and chronic kidney disease with heart failure and stage 1 through stage 4 chronic kidney disease, or unspecified chronic kidney disease: Secondary | ICD-10-CM | POA: Diagnosis not present

## 2024-09-03 DIAGNOSIS — G4733 Obstructive sleep apnea (adult) (pediatric): Secondary | ICD-10-CM | POA: Diagnosis not present

## 2024-09-03 DIAGNOSIS — I25118 Atherosclerotic heart disease of native coronary artery with other forms of angina pectoris: Secondary | ICD-10-CM | POA: Insufficient documentation

## 2024-09-03 DIAGNOSIS — Z7984 Long term (current) use of oral hypoglycemic drugs: Secondary | ICD-10-CM | POA: Diagnosis not present

## 2024-09-03 DIAGNOSIS — E1136 Type 2 diabetes mellitus with diabetic cataract: Secondary | ICD-10-CM | POA: Insufficient documentation

## 2024-09-03 DIAGNOSIS — N184 Chronic kidney disease, stage 4 (severe): Secondary | ICD-10-CM | POA: Insufficient documentation

## 2024-09-03 DIAGNOSIS — Z9649 Presence of other endocrine implants: Secondary | ICD-10-CM | POA: Diagnosis not present

## 2024-09-03 DIAGNOSIS — Z9841 Cataract extraction status, right eye: Secondary | ICD-10-CM | POA: Insufficient documentation

## 2024-09-03 DIAGNOSIS — E1122 Type 2 diabetes mellitus with diabetic chronic kidney disease: Secondary | ICD-10-CM | POA: Insufficient documentation

## 2024-09-03 DIAGNOSIS — J449 Chronic obstructive pulmonary disease, unspecified: Secondary | ICD-10-CM | POA: Insufficient documentation

## 2024-09-03 DIAGNOSIS — Z794 Long term (current) use of insulin: Secondary | ICD-10-CM | POA: Diagnosis not present

## 2024-09-03 DIAGNOSIS — F17210 Nicotine dependence, cigarettes, uncomplicated: Secondary | ICD-10-CM | POA: Diagnosis not present

## 2024-09-03 DIAGNOSIS — E1165 Type 2 diabetes mellitus with hyperglycemia: Secondary | ICD-10-CM | POA: Diagnosis not present

## 2024-09-03 DIAGNOSIS — D631 Anemia in chronic kidney disease: Secondary | ICD-10-CM | POA: Diagnosis not present

## 2024-09-03 DIAGNOSIS — I5032 Chronic diastolic (congestive) heart failure: Secondary | ICD-10-CM | POA: Diagnosis not present

## 2024-09-03 HISTORY — PX: CATARACT EXTRACTION W/PHACO: SHX586

## 2024-09-03 HISTORY — DX: Atherosclerotic heart disease of native coronary artery with other forms of angina pectoris: I25.118

## 2024-09-03 HISTORY — DX: Obstructive sleep apnea (adult) (pediatric): G47.33

## 2024-09-03 HISTORY — DX: Heart failure, unspecified: I50.9

## 2024-09-03 HISTORY — DX: Polyneuropathy, unspecified: G62.9

## 2024-09-03 HISTORY — DX: Other forms of angina pectoris: I20.89

## 2024-09-03 HISTORY — DX: Hidradenitis suppurativa: L73.2

## 2024-09-03 HISTORY — DX: Type 2 diabetes mellitus with moderate nonproliferative diabetic retinopathy without macular edema, unspecified eye: E11.3399

## 2024-09-03 HISTORY — DX: Nicotine dependence, unspecified, uncomplicated: F17.200

## 2024-09-03 HISTORY — DX: Type 2 diabetes mellitus with foot ulcer: E11.621

## 2024-09-03 HISTORY — DX: Chronic kidney disease, stage 4 (severe): N18.4

## 2024-09-03 HISTORY — DX: Obesity, class 1: E66.811

## 2024-09-03 HISTORY — DX: Chronic diastolic (congestive) heart failure: I50.32

## 2024-09-03 LAB — BASIC METABOLIC PANEL WITH GFR
Anion gap: 15 (ref 5–15)
BUN: 62 mg/dL — ABNORMAL HIGH (ref 6–20)
CO2: 33 mmol/L — ABNORMAL HIGH (ref 22–32)
Calcium: 9.1 mg/dL (ref 8.9–10.3)
Chloride: 85 mmol/L — ABNORMAL LOW (ref 98–111)
Creatinine, Ser: 2.53 mg/dL — ABNORMAL HIGH (ref 0.61–1.24)
GFR, Estimated: 29 mL/min — ABNORMAL LOW (ref >60)
Glucose, Bld: 345 mg/dL — ABNORMAL HIGH (ref 70–99)
Potassium: 3 mmol/L — ABNORMAL LOW (ref 3.5–5.1)
Sodium: 133 mmol/L — ABNORMAL LOW (ref 135–145)

## 2024-09-03 LAB — GLUCOSE, CAPILLARY
Glucose-Capillary: 319 mg/dL — ABNORMAL HIGH (ref 70–99)
Glucose-Capillary: 335 mg/dL — ABNORMAL HIGH (ref 70–99)
Glucose-Capillary: 347 mg/dL — ABNORMAL HIGH (ref 70–99)
Glucose-Capillary: 351 mg/dL — ABNORMAL HIGH (ref 70–99)

## 2024-09-03 SURGERY — PHACOEMULSIFICATION, CATARACT, WITH IOL INSERTION
Anesthesia: Monitor Anesthesia Care | Site: Eye | Laterality: Left

## 2024-09-03 MED ORDER — INSULIN REGULAR HUMAN 100 UNIT/ML IJ SOLN
INTRAMUSCULAR | Status: AC
  Filled 2024-09-03: qty 5

## 2024-09-03 MED ORDER — BRIMONIDINE TARTRATE-TIMOLOL 0.2-0.5 % OP SOLN
OPHTHALMIC | Status: DC | PRN
  Administered 2024-09-03: 1 [drp] via OPHTHALMIC

## 2024-09-03 MED ORDER — MIDAZOLAM HCL 2 MG/2ML IJ SOLN
INTRAMUSCULAR | Status: AC
  Filled 2024-09-03: qty 2

## 2024-09-03 MED ORDER — TETRACAINE HCL 0.5 % OP SOLN
OPHTHALMIC | Status: AC
Start: 2024-09-03 — End: 2024-09-03
  Filled 2024-09-03: qty 4

## 2024-09-03 MED ORDER — FENTANYL CITRATE (PF) 100 MCG/2ML IJ SOLN
INTRAMUSCULAR | Status: AC
  Filled 2024-09-03: qty 2

## 2024-09-03 MED ORDER — ARMC OPHTHALMIC DILATING DROPS
1.0000 | OPHTHALMIC | Status: DC | PRN
  Administered 2024-09-03 (×3): 1 via OPHTHALMIC

## 2024-09-03 MED ORDER — TETRACAINE HCL 0.5 % OP SOLN
1.0000 [drp] | OPHTHALMIC | Status: DC | PRN
  Administered 2024-09-03 (×3): 1 [drp] via OPHTHALMIC

## 2024-09-03 MED ORDER — SIGHTPATH DOSE#1 BSS IO SOLN
INTRAOCULAR | Status: DC | PRN
  Administered 2024-09-03: 15 mL via INTRAOCULAR

## 2024-09-03 MED ORDER — CEFUROXIME OPHTHALMIC INJECTION 1 MG/0.1 ML
INJECTION | OPHTHALMIC | Status: DC | PRN
  Administered 2024-09-03: .1 mL via INTRACAMERAL

## 2024-09-03 MED ORDER — LIDOCAINE HCL (PF) 2 % IJ SOLN
INTRAOCULAR | Status: DC | PRN
  Administered 2024-09-03: 2 mL

## 2024-09-03 MED ORDER — FENTANYL CITRATE (PF) 100 MCG/2ML IJ SOLN
INTRAMUSCULAR | Status: DC | PRN
  Administered 2024-09-03 (×2): 50 ug via INTRAVENOUS

## 2024-09-03 MED ORDER — INSULIN REGULAR HUMAN 100 UNIT/ML IJ SOLN
5.0000 [IU] | Freq: Once | INTRAMUSCULAR | Status: AC
  Administered 2024-09-03: 5 [IU] via INTRAVENOUS

## 2024-09-03 MED ORDER — MIDAZOLAM HCL 2 MG/2ML IJ SOLN
INTRAMUSCULAR | Status: DC | PRN
  Administered 2024-09-03 (×2): 1 mg via INTRAVENOUS

## 2024-09-03 MED ORDER — ARMC OPHTHALMIC DILATING DROPS
OPHTHALMIC | Status: AC
  Filled 2024-09-03: qty 0.5

## 2024-09-03 MED ORDER — LACTATED RINGERS IV SOLN: INTRAVENOUS | Status: DC

## 2024-09-03 MED ORDER — INSULIN REGULAR HUMAN 100 UNIT/ML IJ SOLN: 10.0000 [IU] | Freq: Once | INTRAMUSCULAR | Status: DC

## 2024-09-03 MED ORDER — SIGHTPATH DOSE#1 BSS IO SOLN
INTRAOCULAR | Status: DC | PRN
  Administered 2024-09-03: 60 mL via OPHTHALMIC

## 2024-09-03 MED ORDER — SIGHTPATH DOSE#1 NA HYALUR & NA CHOND-NA HYALUR IO KIT
PACK | INTRAOCULAR | Status: DC | PRN
  Administered 2024-09-03: 1 via OPHTHALMIC

## 2024-09-03 SURGICAL SUPPLY — 8 items
FEE CATARACT SUITE SIGHTPATH (MISCELLANEOUS) ×1 IMPLANT
GLOVE BIOGEL PI IND STRL 8 (GLOVE) ×1 IMPLANT
GLOVE SURG LX STRL 7.5 STRW (GLOVE) ×1 IMPLANT
GLOVE SURG SYN 6.5 PF PI BL (GLOVE) ×1 IMPLANT
LENS IOL TECNIS EYHANCE 16.5 (Intraocular Lens) IMPLANT
NDL FILTER BLUNT 18X1 1/2 (NEEDLE) ×1 IMPLANT
NEEDLE FILTER BLUNT 18X1 1/2 (NEEDLE) ×1 IMPLANT
SYR 3ML LL SCALE MARK (SYRINGE) ×1 IMPLANT

## 2024-09-03 NOTE — Anesthesia Postprocedure Evaluation (Signed)
 Anesthesia Post Note  Patient: Harold Crosby  Procedure(s) Performed: PHACOEMULSIFICATION, CATARACT, WITH IOL INSERTION 4.14 00:32.7 (Left: Eye)  Patient location during evaluation: PACU Anesthesia Type: MAC Level of consciousness: awake and alert Pain management: pain level controlled Vital Signs Assessment: post-procedure vital signs reviewed and stable Respiratory status: spontaneous breathing, nonlabored ventilation, respiratory function stable and patient connected to nasal cannula oxygen Cardiovascular status: stable and blood pressure returned to baseline Postop Assessment: no apparent nausea or vomiting Anesthetic complications: no   No notable events documented.   Last Vitals:  Vitals:   09/03/24 1240 09/03/24 1244  BP: 122/65 121/70  Pulse: 79 78  Resp: 14 (!) 23  Temp: 36.6 C   SpO2: 97% 97%    Last Pain:  Vitals:   09/03/24 1244  TempSrc:   PainSc: 0-No pain                 Altair Stanko C Jarrah Seher

## 2024-09-03 NOTE — H&P (Signed)
 [facility]   Primary Care Physician:  Fernande Ophelia JINNY DOUGLAS, MD Ophthalmologist: Dr. Dene Etienne  Pre-Procedure History & Physical: HPI:  Harold Crosby is a 53 y.o. male here for ophthalmic surgery.   Past Medical History:  Diagnosis Date   Anemia    Anemia due to stage 4 chronic kidney disease (HCC)    Arthritis    Chronic diastolic heart failure (HCC)    Chronic kidney disease    Chronic renal disease, stage IV (HCC)    Chronic stable angina (HCC)    CKD (chronic kidney disease), stage IV (HCC)    Congestive heart failure (CHF) (HCC)    COPD (chronic obstructive pulmonary disease) (HCC)    Coronary artery disease of native artery of native heart with stable angina pectoris (HCC)    Diabetic foot ulcer (HCC)    Gout    Hidradenitis suppurativa    History of thrush    HIV (human immunodeficiency virus infection) (HCC)    Followed by Dr. Eben in ID clinic.    Hyperlipidemia    Hypertension    Insomnia    Moderate nonproliferative diabetic retinopathy (HCC)    Obesity (BMI 30.0-34.9)    Obstructive sleep apnea    OSA on CPAP    Polyneuropathy    Tobacco abuse    Tobacco use disorder    Type II diabetes mellitus (HCC)     Past Surgical History:  Procedure Laterality Date   CATARACT EXTRACTION W/PHACO Right 08/20/2024   Procedure: PHACOEMULSIFICATION, CATARACT, WITH IOL INSERTION 3.62 00:35.3;  Surgeon: Etienne Dene, MD;  Location: [facility];  Service: Ophthalmology;  Laterality: Right;   INCISION AND DRAINAGE ABSCESS N/A 01/14/2023   Procedure: INCISION AND DRAINAGE PERIRECTAL ABSCESS;  Surgeon: Jordis Laneta FALCON, MD;  Location: [facility];  Service: General;  Laterality: N/A;   STENT PLACEMENT VASCULAR ([facility] HX)      Prior to Admission medications   Medication Sig Start Date End Date Taking? Authorizing Provider  allopurinol  (ZYLOPRIM ) 100 MG tablet Take 100 mg by mouth daily.   Yes [provider]   bictegravir-emtricitabine -tenofovir  AF (BIKTARVY) 50-200-25 MG TABS tablet Take 1 tablet by mouth daily. 07/21/24  Yes Manandhar, Sabina, MD  empagliflozin (JARDIANCE) 25 MG TABS tablet  01/09/23  Yes [provider]  gabapentin  (NEURONTIN ) 600 MG tablet Take 1,200 mg by mouth 3 (three) times daily. 12/14/22  Yes [provider]  HUMULIN  R U-500 KWIKPEN 500 UNIT/ML KwikPen Inject 100-200 Units into the skin 3 (three) times daily with meals. 12/05/22  Yes [provider]  metFORMIN  (GLUCOPHAGE ) 1000 MG tablet Take 1 tablet (1,000 mg total) by mouth 2 (two) times daily with a meal. 07/19/12  Yes Lanis Harder, MD  rosuvastatin  (CRESTOR ) 20 MG tablet Take 1 tablet by mouth daily. 01/05/21  Yes [provider]  tiZANidine  (ZANAFLEX ) 4 MG tablet Take 4 mg by mouth 3 (three) times daily. 12/11/22  Yes [provider]  torsemide (DEMADEX) 10 MG tablet Take 10 mg by mouth daily.   Yes [provider]  celecoxib (CELEBREX) 100 MG capsule Take 100 mg by mouth 2 (two) times daily as needed. Patient not taking: Reported on 10/03/2023 02/22/21   [provider]  colchicine 0.6 MG tablet Take 0.6 mg by mouth as needed. Patient not taking: Reported on 08/11/2024    [provider]  Continuous Glucose Receiver (FREESTYLE LIBRE 3 READER) DEVI  03/20/23   [provider]  doxycycline  (VIBRA -TABS) 100  MG tablet Take 1 tablet (100 mg total) by mouth 2 (two) times daily. Patient not taking: Reported on 08/11/2024 09/05/23   Tobie Franky SQUIBB, DPM  finasteride  (PROSCAR ) 5 MG tablet Take 5 mg by mouth daily.    [provider]  furosemide  (LASIX ) 40 MG tablet Take 80 mg by mouth 3 (three) times daily. Patient not taking: Reported on 08/11/2024    [provider]  insulin  glargine (LANTUS ) 100 UNIT/ML injection Inject 0.45 mLs (45 Units total) into the skin 2 (two) times daily. 11/24/15   Burns, Alexa R, MD  liraglutide (VICTOZA) 18  MG/3ML SOPN Inject 1.8 mg into the skin daily. Patient not taking: Reported on 08/11/2024    [provider]  oxyCODONE  (OXY IR/ROXICODONE ) 5 MG immediate release tablet Take 1 tablet (5 mg total) by mouth every 6 (six) hours as needed for severe pain or breakthrough pain. Patient not taking: Reported on 08/20/2024 01/23/23   Schulz, Zachary R, PA-C    Allergies as of 07/22/2024 - Review Complete 07/21/2024  Allergen Reaction Noted   Sulfonamide derivatives Anaphylaxis     Family History  Problem Relation Age of Onset   Coronary artery disease Other    Diabetes Other    Kidney failure Mother     Social History   Socioeconomic History   Marital status: Single    Spouse name: Not on file   Number of children: Not on file   Years of education: Not on file   Highest education level: Not on file  Occupational History   Not on file  Tobacco Use   Smoking status: Every Day    Current packs/day: 0.10    Average packs/day: 0.1 packs/day for 20.0 years (2.0 ttl pk-yrs)    Types: Cigarettes    Passive exposure: Past   Smokeless tobacco: Never   Tobacco comments:    3-4 cigarettes a day   Substance and Sexual Activity   Alcohol use: No   Drug use: No   Sexual activity: Yes    Partners: Female    Comment: declined condoms  Other Topics Concern   Not on file  Social History Narrative   Same girlfriend x 7 years. No previous TFX. No previous male/male intercourse.  Un clear how many heterosexual partners he has had >10, <100.      [place] [place] till 03/25/11 [employer] benefits approved: patient eligible for 100% discount for outpatient lasbs and office visits. Patient available for no discount for other services. Heron Goodell 11/11.   Social Drivers of Health   Financial Resource Strain: Medium Risk (12/13/2023)   Received from [facility]   Overall Financial Resource Strain (CARDIA)    Difficulty of Paying Living Expenses: Somewhat hard  Food Insecurity: Food  Insecurity Present (12/13/2023)   Received from [facility]   Hunger Vital Sign    Within the past 12 months, you worried that your food would run out before you got the money to buy more.: Sometimes true    Within the past 12 months, the food you bought just didn't last and you didn't have money to get more.: Sometimes true  Transportation Needs: No Transportation Needs (12/13/2023)   Received from [facility] - Transportation    Lack of Transportation (Medical): No    Lack of Transportation (Non-Medical): No  Physical Activity: Not on file  Stress: Not on file  Social Connections: Not on file  Intimate Partner Violence: Not At Risk (  07/04/2024)   Received from [facility]   Humiliation, Afraid, Rape, and Kick questionnaire    Within the last year, have you been afraid of your partner or ex-partner?: No    Within the last year, have you been humiliated or emotionally abused in other ways by your partner or ex-partner?: No    Within the last year, have you been kicked, hit, slapped, or otherwise physically hurt by your partner or ex-partner?: No    Within the last year, have you been raped or forced to have any kind of sexual activity by your partner or ex-partner?: No    Review of Systems: See HPI, otherwise negative ROS  Physical Exam: BP (!) 154/74   Pulse 85   Temp 98.6 F (37 C) (Temporal)   Resp 12   Ht 6' 0.99 (1.854 m)   Wt 119.5 kg   SpO2 96%   BMI 34.76 kg/m  General:   Alert,  pleasant and cooperative in NAD Head:  Normocephalic and atraumatic. Lungs:  Clear to auscultation.    Heart:  Regular rate and rhythm.   Impression/Plan: Harold Crosby is here for ophthalmic surgery.  Risks, benefits, limitations, and alternatives regarding ophthalmic surgery have been reviewed with the patient.  Questions have been answered.  All parties agreeable.   MITTIE GASKIN, MD  09/03/2024, 10:13 AM

## 2024-09-03 NOTE — Transfer of Care (Signed)
 Immediate Anesthesia Transfer of Care Note  Patient: Harold Crosby  Procedure(s) Performed: PHACOEMULSIFICATION, CATARACT, WITH IOL INSERTION 4.14 00:32.7 (Left: Eye)  Patient Location: PACU  Anesthesia Type: MAC  Level of Consciousness: awake, alert  and patient cooperative  Airway and Oxygen Therapy: Patient Spontanous Breathing and Patient connected to supplemental oxygen  Post-op Assessment: Post-op Vital signs reviewed, Patient's Cardiovascular Status Stable, Respiratory Function Stable, Patent Airway and No signs of Nausea or vomiting  Post-op Vital Signs: Reviewed and stable  Complications: No notable events documented.

## 2024-09-03 NOTE — Op Note (Signed)
 OPERATIVE NOTE  Harold Crosby [id number] 09/03/2024   PREOPERATIVE DIAGNOSIS:  Nuclear sclerotic cataract left eye. H25.12   POSTOPERATIVE DIAGNOSIS:    Nuclear sclerotic cataract left eye.     PROCEDURE:  Phacoemusification with posterior chamber intraocular lens placement of the left eye  Ultrasound time: Procedure(s): PHACOEMULSIFICATION, CATARACT, WITH IOL INSERTION 4.14 00:32.7 (Left)  LENS:   Implant Name Type Inv. Item Serial No. Manufacturer Lot No. LRB No. Used Action  LENS IOL TECNIS EYHANCE 16.5 - [id number] Intraocular Lens LENS IOL TECNIS EYHANCE 16.5 [id number] [employer]  Left 1 Implanted      SURGEON:  Dene FABIENE Etienne, MD   ANESTHESIA:  Topical with tetracaine  drops and 2% Xylocaine  jelly, augmented with 1% preservative-free intracameral lidocaine .    COMPLICATIONS:  None.   DESCRIPTION OF PROCEDURE:  The patient was identified in the holding room and transported to the operating room and placed in the supine position under the operating microscope.  The left eye was identified as the operative eye and it was prepped and draped in the usual sterile ophthalmic fashion.   A 1 millimeter clear-corneal paracentesis was made at the 1:30 position.  0.5 ml of preservative-free 1% lidocaine  was injected into the anterior chamber.  The anterior chamber was filled with Viscoat viscoelastic.  A 2.4 millimeter keratome was used to make a near-clear corneal incision at the 10:30 position.  .  A curvilinear capsulorrhexis was made with a cystotome and capsulorrhexis forceps.  Balanced salt solution was used to hydrodissect and hydrodelineate the nucleus.   Phacoemulsification was then used in stop and chop fashion to remove the lens nucleus and epinucleus.  The remaining cortex was then removed using the irrigation and aspiration handpiece. Provisc was then placed into the capsular bag to distend it for lens placement.  A lens was then injected into the capsular bag.  The  remaining viscoelastic was aspirated.   Wounds were hydrated with balanced salt solution.  The anterior chamber was inflated to a physiologic pressure with balanced salt solution.  No wound leaks were noted. Cefuroxime  0.1 ml of a 10mg /ml solution was injected into the anterior chamber for a dose of 1 mg of intracameral antibiotic at the completion of the case.   Timolol  and Brimonidine  drops were applied to the eye.  The patient was taken to the recovery room in stable condition without complications of anesthesia or surgery.  [name] 09/03/2024, 12:38 PM

## 2024-09-23 DIAGNOSIS — Z794 Long term (current) use of insulin: Secondary | ICD-10-CM | POA: Diagnosis not present

## 2024-09-23 DIAGNOSIS — N184 Chronic kidney disease, stage 4 (severe): Secondary | ICD-10-CM | POA: Diagnosis not present

## 2024-09-23 DIAGNOSIS — N183 Chronic kidney disease, stage 3 unspecified: Secondary | ICD-10-CM | POA: Diagnosis not present

## 2024-09-23 DIAGNOSIS — E1122 Type 2 diabetes mellitus with diabetic chronic kidney disease: Secondary | ICD-10-CM | POA: Diagnosis not present

## 2024-09-23 DIAGNOSIS — Z95818 Presence of other cardiac implants and grafts: Secondary | ICD-10-CM | POA: Diagnosis not present

## 2024-09-23 DIAGNOSIS — D631 Anemia in chronic kidney disease: Secondary | ICD-10-CM | POA: Diagnosis not present

## 2024-09-23 DIAGNOSIS — E1121 Type 2 diabetes mellitus with diabetic nephropathy: Secondary | ICD-10-CM | POA: Diagnosis not present

## 2024-09-23 DIAGNOSIS — E8770 Fluid overload, unspecified: Secondary | ICD-10-CM | POA: Diagnosis not present

## 2024-09-24 DIAGNOSIS — R1084 Generalized abdominal pain: Secondary | ICD-10-CM | POA: Diagnosis not present

## 2024-09-24 DIAGNOSIS — R194 Change in bowel habit: Secondary | ICD-10-CM | POA: Diagnosis not present

## 2024-09-24 DIAGNOSIS — R141 Gas pain: Secondary | ICD-10-CM | POA: Diagnosis not present

## 2024-09-30 DIAGNOSIS — H35353 Cystoid macular degeneration, bilateral: Secondary | ICD-10-CM | POA: Diagnosis not present

## 2024-10-28 DIAGNOSIS — E1159 Type 2 diabetes mellitus with other circulatory complications: Secondary | ICD-10-CM | POA: Diagnosis not present

## 2024-10-28 DIAGNOSIS — Z794 Long term (current) use of insulin: Secondary | ICD-10-CM | POA: Diagnosis not present

## 2024-11-10 DIAGNOSIS — Z794 Long term (current) use of insulin: Secondary | ICD-10-CM | POA: Diagnosis not present

## 2024-11-11 DIAGNOSIS — Z794 Long term (current) use of insulin: Secondary | ICD-10-CM | POA: Diagnosis not present

## 2024-11-11 DIAGNOSIS — E1159 Type 2 diabetes mellitus with other circulatory complications: Secondary | ICD-10-CM | POA: Diagnosis not present

## 2024-11-11 DIAGNOSIS — E119 Type 2 diabetes mellitus without complications: Secondary | ICD-10-CM | POA: Diagnosis not present

## 2024-12-01 DIAGNOSIS — Z95818 Presence of other cardiac implants and grafts: Secondary | ICD-10-CM | POA: Diagnosis not present

## 2024-12-17 ENCOUNTER — Other Ambulatory Visit
Admission: RE | Admit: 2024-12-17 | Discharge: 2024-12-17 | Disposition: A | Discharge status: Home / Self Care | Source: Ambulatory Visit | Attending: Gastroenterology | Admitting: Gastroenterology

## 2024-12-17 DIAGNOSIS — R197 Diarrhea, unspecified: Secondary | ICD-10-CM | POA: Diagnosis present

## 2024-12-17 LAB — GASTROINTESTINAL PANEL BY PCR, STOOL (REPLACES STOOL CULTURE)

## 2024-12-20 LAB — CALPROTECTIN, FECAL: Calprotectin, Fecal: 9 ug/g (ref 0–120)

## 2025-02-04 ENCOUNTER — Ambulatory Visit: Payer: Self-pay | Admitting: Infectious Disease
